# Patient Record
Sex: Female | Born: 1945 | Race: White | Hispanic: No | Smoking: Former smoker
Health system: Southern US, Community
[De-identification: ages and names within clinical notes are randomized; demographics above are authoritative.]

## PROBLEM LIST (undated history)

## (undated) DIAGNOSIS — I10 Essential (primary) hypertension: Secondary | ICD-10-CM

## (undated) DIAGNOSIS — F439 Reaction to severe stress, unspecified: Secondary | ICD-10-CM

## (undated) DIAGNOSIS — F329 Major depressive disorder, single episode, unspecified: Secondary | ICD-10-CM

## (undated) DIAGNOSIS — D649 Anemia, unspecified: Secondary | ICD-10-CM

## (undated) DIAGNOSIS — C241 Malignant neoplasm of ampulla of Vater: Secondary | ICD-10-CM

## (undated) DIAGNOSIS — M545 Low back pain, unspecified: Secondary | ICD-10-CM

## (undated) DIAGNOSIS — F32A Depression, unspecified: Secondary | ICD-10-CM

## (undated) DIAGNOSIS — E785 Hyperlipidemia, unspecified: Secondary | ICD-10-CM

## (undated) DIAGNOSIS — F419 Anxiety disorder, unspecified: Secondary | ICD-10-CM

## (undated) DIAGNOSIS — G44229 Chronic tension-type headache, not intractable: Secondary | ICD-10-CM

## (undated) DIAGNOSIS — N200 Calculus of kidney: Secondary | ICD-10-CM

## (undated) DIAGNOSIS — K589 Irritable bowel syndrome without diarrhea: Secondary | ICD-10-CM

## (undated) DIAGNOSIS — Z86711 Personal history of pulmonary embolism: Secondary | ICD-10-CM

## (undated) DIAGNOSIS — K219 Gastro-esophageal reflux disease without esophagitis: Secondary | ICD-10-CM

## (undated) HISTORY — DX: Anemia, unspecified: D64.9

## (undated) HISTORY — PX: WHIPPLE PROCEDURE: SHX2667

## (undated) HISTORY — DX: Hyperlipidemia, unspecified: E78.5

## (undated) HISTORY — PX: AUGMENTATION MAMMAPLASTY: SUR837

## (undated) HISTORY — PX: HIP SURGERY: SHX245

## (undated) HISTORY — PX: VENTRAL HERNIA REPAIR: SHX424

## (undated) HISTORY — PX: BLADDER SURGERY: SHX569

## (undated) HISTORY — DX: Calculus of kidney: N20.0

## (undated) HISTORY — DX: Low back pain: M54.5

## (undated) HISTORY — PX: CHOLECYSTECTOMY: SHX55

## (undated) HISTORY — PX: COLONOSCOPY: SHX174

## (undated) HISTORY — DX: Personal history of pulmonary embolism: Z86.711

## (undated) HISTORY — DX: Essential (primary) hypertension: I10

## (undated) HISTORY — DX: Reaction to severe stress, unspecified: F43.9

## (undated) HISTORY — DX: Chronic tension-type headache, not intractable: G44.229

## (undated) HISTORY — PX: TUBAL LIGATION: SHX77

## (undated) HISTORY — PX: PLACEMENT OF BREAST IMPLANTS: SHX6334

## (undated) HISTORY — DX: Irritable bowel syndrome, unspecified: K58.9

## (undated) HISTORY — DX: Malignant neoplasm of ampulla of Vater: C24.1

## (undated) HISTORY — PX: PANCREATICODUODENECTOMY: SUR1000

## (undated) HISTORY — PX: LITHOTRIPSY: SUR834

## (undated) HISTORY — PX: APPENDECTOMY: SHX54

## (undated) HISTORY — DX: Anxiety disorder, unspecified: F41.9

## (undated) HISTORY — DX: Gastro-esophageal reflux disease without esophagitis: K21.9

## (undated) HISTORY — DX: Low back pain, unspecified: M54.50

---

## 2004-04-07 ENCOUNTER — Ambulatory Visit: Payer: Self-pay | Admitting: Unknown Physician Specialty

## 2004-07-25 ENCOUNTER — Emergency Department: Payer: Self-pay | Admitting: Emergency Medicine

## 2004-11-21 ENCOUNTER — Ambulatory Visit: Payer: Self-pay | Admitting: Unknown Physician Specialty

## 2005-01-22 ENCOUNTER — Ambulatory Visit: Payer: Self-pay | Admitting: Unknown Physician Specialty

## 2005-11-23 ENCOUNTER — Ambulatory Visit: Payer: Self-pay | Admitting: Unknown Physician Specialty

## 2006-12-17 ENCOUNTER — Ambulatory Visit: Payer: Self-pay | Admitting: Unknown Physician Specialty

## 2007-06-13 ENCOUNTER — Ambulatory Visit: Payer: Self-pay | Admitting: Unknown Physician Specialty

## 2008-01-09 ENCOUNTER — Ambulatory Visit: Payer: Self-pay | Admitting: Unknown Physician Specialty

## 2008-10-11 ENCOUNTER — Ambulatory Visit: Payer: Self-pay | Admitting: Unknown Physician Specialty

## 2008-11-23 ENCOUNTER — Ambulatory Visit: Payer: Self-pay | Admitting: Unknown Physician Specialty

## 2008-12-21 ENCOUNTER — Ambulatory Visit: Payer: Self-pay | Admitting: Gastroenterology

## 2009-01-11 ENCOUNTER — Ambulatory Visit: Payer: Self-pay | Admitting: Unknown Physician Specialty

## 2009-02-27 DIAGNOSIS — I2699 Other pulmonary embolism without acute cor pulmonale: Secondary | ICD-10-CM | POA: Insufficient documentation

## 2009-02-27 DIAGNOSIS — Z86711 Personal history of pulmonary embolism: Secondary | ICD-10-CM | POA: Insufficient documentation

## 2009-02-28 ENCOUNTER — Ambulatory Visit: Payer: Self-pay | Admitting: Unknown Physician Specialty

## 2009-05-20 ENCOUNTER — Ambulatory Visit: Payer: Self-pay | Admitting: Unknown Physician Specialty

## 2009-12-26 ENCOUNTER — Ambulatory Visit: Payer: Self-pay | Admitting: Specialist

## 2010-01-27 ENCOUNTER — Ambulatory Visit: Payer: Self-pay | Admitting: Specialist

## 2010-02-19 ENCOUNTER — Ambulatory Visit: Payer: Self-pay | Admitting: Specialist

## 2010-03-06 ENCOUNTER — Ambulatory Visit: Payer: Self-pay | Admitting: Unknown Physician Specialty

## 2011-04-06 ENCOUNTER — Ambulatory Visit: Payer: Self-pay | Admitting: Unknown Physician Specialty

## 2012-05-03 ENCOUNTER — Ambulatory Visit: Payer: Self-pay | Admitting: Physician Assistant

## 2012-07-22 ENCOUNTER — Emergency Department: Payer: Self-pay | Admitting: Emergency Medicine

## 2012-07-22 LAB — COMPREHENSIVE METABOLIC PANEL
Albumin: 3.2 g/dL — ABNORMAL LOW (ref 3.4–5.0)
Alkaline Phosphatase: 105 U/L (ref 50–136)
Anion Gap: 9 (ref 7–16)
BUN: 20 mg/dL — ABNORMAL HIGH (ref 7–18)
Bilirubin,Total: 0.5 mg/dL (ref 0.2–1.0)
Calcium, Total: 8.4 mg/dL — ABNORMAL LOW (ref 8.5–10.1)
Chloride: 105 mmol/L (ref 98–107)
Co2: 24 mmol/L (ref 21–32)
Creatinine: 0.92 mg/dL (ref 0.60–1.30)
EGFR (African American): >60
EGFR (Non-African Amer.): >60
Glucose: 166 mg/dL — ABNORMAL HIGH (ref 65–99)
Osmolality: 282 (ref 275–301)
Potassium: 3.6 mmol/L (ref 3.5–5.1)
SGOT(AST): 26 U/L (ref 15–37)
SGPT (ALT): 35 U/L (ref 12–78)
Sodium: 138 mmol/L (ref 136–145)
Total Protein: 6.7 g/dL (ref 6.4–8.2)

## 2012-07-22 LAB — CBC
HCT: 39.5 % (ref 35.0–47.0)
HGB: 13.5 g/dL (ref 12.0–16.0)
MCH: 31.3 pg (ref 26.0–34.0)
MCHC: 34.3 g/dL (ref 32.0–36.0)
MCV: 91 fL (ref 80–100)
Platelet: 185 10*3/uL (ref 150–440)
RBC: 4.32 10*6/uL (ref 3.80–5.20)
RDW: 12.1 % (ref 11.5–14.5)
WBC: 9 10*3/uL (ref 3.6–11.0)

## 2012-07-22 LAB — TROPONIN I: Troponin-I: <0.02 ng/mL

## 2013-03-30 ENCOUNTER — Ambulatory Visit: Payer: Self-pay | Admitting: Physician Assistant

## 2013-04-05 ENCOUNTER — Ambulatory Visit: Payer: Self-pay | Admitting: Urology

## 2013-04-13 ENCOUNTER — Ambulatory Visit: Payer: Self-pay | Admitting: Urology

## 2013-05-02 ENCOUNTER — Ambulatory Visit: Payer: Self-pay | Admitting: Urology

## 2013-07-12 ENCOUNTER — Ambulatory Visit: Payer: Self-pay | Admitting: Urology

## 2014-01-16 ENCOUNTER — Inpatient Hospital Stay: Payer: Self-pay | Admitting: Internal Medicine

## 2014-01-16 LAB — BASIC METABOLIC PANEL
Anion Gap: 6 — ABNORMAL LOW (ref 7–16)
BUN: 21 mg/dL — ABNORMAL HIGH (ref 7–18)
Calcium, Total: 8.2 mg/dL — ABNORMAL LOW (ref 8.5–10.1)
Chloride: 106 mmol/L (ref 98–107)
Co2: 28 mmol/L (ref 21–32)
Creatinine: 0.85 mg/dL (ref 0.60–1.30)
EGFR (African American): >60
EGFR (Non-African Amer.): >60
Glucose: 112 mg/dL — ABNORMAL HIGH (ref 65–99)
Osmolality: 283 (ref 275–301)
Potassium: 3.9 mmol/L (ref 3.5–5.1)
Sodium: 140 mmol/L (ref 136–145)

## 2014-01-16 LAB — URINALYSIS, COMPLETE
Bacteria: NONE SEEN
Bilirubin,UR: NEGATIVE
Blood: NEGATIVE
Glucose,UR: NEGATIVE mg/dL (ref 0–75)
Ketone: NEGATIVE
Leukocyte Esterase: NEGATIVE
Nitrite: NEGATIVE
Ph: 7 (ref 4.5–8.0)
Protein: NEGATIVE
RBC,UR: 41 /HPF (ref 0–5)
Specific Gravity: 1.013 (ref 1.003–1.030)
Squamous Epithelial: 1
WBC UR: 7 /HPF (ref 0–5)

## 2014-01-16 LAB — CBC
HCT: 41.4 % (ref 35.0–47.0)
HGB: 14 g/dL (ref 12.0–16.0)
MCH: 31.4 pg (ref 26.0–34.0)
MCHC: 33.9 g/dL (ref 32.0–36.0)
MCV: 93 fL (ref 80–100)
Platelet: 213 10*3/uL (ref 150–440)
RBC: 4.47 10*6/uL (ref 3.80–5.20)
RDW: 12.6 % (ref 11.5–14.5)
WBC: 8.7 10*3/uL (ref 3.6–11.0)

## 2014-01-18 LAB — BASIC METABOLIC PANEL
Anion Gap: 5 — ABNORMAL LOW (ref 7–16)
BUN: 14 mg/dL (ref 7–18)
Calcium, Total: 7.6 mg/dL — ABNORMAL LOW (ref 8.5–10.1)
Chloride: 103 mmol/L (ref 98–107)
Co2: 29 mmol/L (ref 21–32)
Creatinine: 0.89 mg/dL (ref 0.60–1.30)
EGFR (African American): >60
EGFR (Non-African Amer.): >60
Glucose: 128 mg/dL — ABNORMAL HIGH (ref 65–99)
Osmolality: 276 (ref 275–301)
Potassium: 3.8 mmol/L (ref 3.5–5.1)
Sodium: 137 mmol/L (ref 136–145)

## 2014-01-18 LAB — PLATELET COUNT: Platelet: 202 10*3/uL (ref 150–440)

## 2014-01-18 LAB — HEMOGLOBIN: HGB: 12.2 g/dL (ref 12.0–16.0)

## 2014-01-19 LAB — BASIC METABOLIC PANEL
Anion Gap: 3 — ABNORMAL LOW (ref 7–16)
BUN: 14 mg/dL (ref 7–18)
Calcium, Total: 7.8 mg/dL — ABNORMAL LOW (ref 8.5–10.1)
Chloride: 108 mmol/L — ABNORMAL HIGH (ref 98–107)
Co2: 29 mmol/L (ref 21–32)
Creatinine: 0.82 mg/dL (ref 0.60–1.30)
EGFR (African American): >60
EGFR (Non-African Amer.): >60
Glucose: 113 mg/dL — ABNORMAL HIGH (ref 65–99)
Osmolality: 281 (ref 275–301)
Potassium: 3.7 mmol/L (ref 3.5–5.1)
Sodium: 140 mmol/L (ref 136–145)

## 2014-01-19 LAB — PLATELET COUNT: Platelet: 215 10*3/uL (ref 150–440)

## 2014-01-19 LAB — HEMOGLOBIN: HGB: 11.8 g/dL — ABNORMAL LOW (ref 12.0–16.0)

## 2014-01-22 LAB — PATHOLOGY REPORT

## 2014-02-21 DIAGNOSIS — F430 Acute stress reaction: Secondary | ICD-10-CM | POA: Insufficient documentation

## 2014-03-06 DIAGNOSIS — Z9660 Presence of unspecified orthopedic joint implant: Secondary | ICD-10-CM | POA: Insufficient documentation

## 2014-03-14 ENCOUNTER — Ambulatory Visit: Payer: Self-pay

## 2014-05-22 ENCOUNTER — Ambulatory Visit: Payer: Self-pay | Admitting: Physician Assistant

## 2014-08-17 NOTE — Op Note (Signed)
PATIENT NAME:  Kimberly Meyer, Kimberly Meyer MR#:  [id number] DATE OF BIRTH:  06/19/1945  DATE OF PROCEDURE:  04/05/2013  PREOPERATIVE DIAGNOSIS: Right mid ureteral calculus.   POSTOPERATIVE DIAGNOSIS: Right mid ureteral calculus.  PROCEDURE: Cystoscopy, right retrograde pyelogram and stent placement.  DESCRIPTION OF PROCEDURE: The patient was sterilely prepped and draped in supine lithotomy position for ease of approach to the external genitalia.  I began the procedure. I looked in the bladder, there were no tumors, masses or growths. Right ureter was easily seen in horseshoe orifice in proper position. So, I do a retrograde utilizing an open-ended catheter. The retrograde reveals an calculus at the superior portion of L4 in the mid ureter. The contrast goes around the calculus into the kidney. I was able to an 0.038 wire past the calculus utilizing the open-ended catheter close to the stone. However, I am unable to get a stent up initially, so I have to dilate with the 5-French eight open-ended ureteral catheter over the 0.038 Glidewire. Once there is little dilatation around the stone, I am able to slide a 6-French 26 cm stent easily into place, curled in the renal pelvis and the bladder. I checked its position in the bladder and it is in proper position and it is coiled in the renal pelvis. The patient tolerated procedure well and was sent to recovery in satisfactory condition with an empty bladder. Marcaine 30 mL 0.5% in the bladder and B and O suppository. Of note, the patient has a rectocele with some constipated stool within the rectocele. Otherwise, no tumors, masses or growths in the rectum.    ____________________________ Janice Coffin. Elnoria Howard, DO rdh:aw D: 04/05/2013 14:22:17 ET T: 04/05/2013 14:27:26 ET JOB#: [id number]  cc: Janice Coffin. Elnoria Howard, DO, <Dictator> Sonoma Firkus D Righteous Claiborne DO ELECTRONICALLY SIGNED 04/14/2013 15:39

## 2014-08-18 NOTE — H&P (Signed)
PATIENT NAME:  Kimberly Meyer, Kimberly Meyer MR#:  [id number] DATE OF BIRTH:  01-Nov-1945  DATE OF ADMISSION:  01/16/2014  PRIMARY CARE PHYSICIAN:  Dr. Kem Kays.    CHIEF COMPLAINT: Right hip pain, fall today.   HISTORY OF PRESENT ILLNESS:  Kimberly Meyer is a 69 year old Caucasian female with past medical history of hypertension, comes to the Emergency Room after she had a fall today and started hurting in her right hip. Workup in the Emergency Room revealed a right femoral neck fracture. The patient is being admitted for further evaluation and management.   The patient reports falling in the parking lot about a week ago and hurting on her right knee since then.  She has been complaining on and off of pain in her right knee and right hip. She had abrasion on her right knee. She was supposed to get MRI of the hip September 29. However she had a fall today and now is being admitted for right femur neck fracture.    PAST MEDICAL HISTORY:  1. Hypertension.  2. Borderline diabetes.  3. Depression.  4. Hypercholesterolemia.   PAST SURGICAL HISTORY: Cholecystectomy and Whipple procedure.    MEDICATIONS:    1.  Zantac 1 capsule 3 times a day.  2.  Nexium 40 mg p.o. daily.  3.  Lisinopril 5 mg daily.  4.  Bupropion 150 mg 1 tablet b.i.d.  5.  Allegra 180 mg p.o. daily.   ALLERGIES: SULFA DRUGS.   FAMILY HISTORY: Positive for father with dementia. Mother with stroke.   SOCIAL HISTORY:  History of smoking in the remote past and quit about 30 years ago. Denies any alcohol use. 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   REVIEW OF SYSTEMS: CONSTITUTIONAL: No fever, fatigue, weakness.  EYES: No blurred or double vision.  ENT: No tinnitus, ear pain, hearing loss.  RESPIRATORY: No cough, wheeze, hemoptysis, or dyspnea.  CARDIOVASCULAR: No chest pain, orthopnea, edema, or arrhythmia.  GASTROINTESTINAL: No nausea, vomiting, diarrhea, abdominal pain.  No GERD.  GENITOURINARY:  No dysuria, hematuria, or  frequency.  ENDOCRINE: No polyuria, nocturia, or thyroid problems.  HEMATOLOGY: No anemia, easy bruising or bleeding.  SKIN: No acne, rash or lesion.  MUSCULOSKELETAL: Positive for right hip pain.  NEUROLOGIC: No CVA, TIA, ataxia, or dementia.   PSYCHIATRIC:  No anxiety or depression.   All other systems reviewed and negative.   PHYSICAL EXAMINATION:  GENERAL: The patient is awake, alert, oriented x 3, not in acute distress.  VITAL SIGNS: Afebrile, pulse is 81, blood pressure is 166/95, saturations are 96% on room air.  HEENT: Atraumatic, normocephalic. Pupils PERRLA.  EOM intact. Oral mucosa is moist.  NECK: Supple. No JVD, no carotid bruits.  RESPIRATORY:  Clear to auscultation.   HEART: Both the heart sounds are normal. Rate and rhythm are regular. PMI not lateralized. Chest is nontender.  VASCULAR: Good pedal pulses, good femoral pulses. No lower extremity edema.  ABDOMEN: Soft, benign, nontender. No organomegaly. Positive bowel sounds.  NEUROLOGIC: Grossly intact cranial nerves II-XII. No motor or sensory deficit.  PSYCHIATRIC:  The patient is awake, alert, oriented x 3.  EXTREMITIES:  The patient does have right hip pain.   RADIOLOGY:  Right hip x-ray showed laterally displaced right femoral neck fracture.  Chest x-ray, no active disease.   LABORATORY DATA:  CBC within normal limits. Basic metabolic panel within normal limits. EKG showed normal sinus rhythm.   ASSESSMENT: A 69 year old, Kimberly Meyer, with history of hypertension, comes in with:  1.  Right femoral neck fracture status post fall. The patient had a mechanical fall. She has no history of coronary artery disease, is very active and functional, no chest pain or shortness of breath. She is at low to intermediate risk for surgery. Risks and benefits of surgery discussed with the patient. We will do orthopedic consultation with Dr. Marry Guan. He is aware of the patient being admitted.  2.  Depression. Continue Wellbutrin  SR b.i.d.  3.  Hypertension. Continue Zestril.  4.  Deep vein thrombosis prophylaxis. Per Dr. Marry Guan.   5.  Gastroesophageal reflux disease.  Continue PPI.   Hospital admission plan was discussed with the patient, no family members were present.   TIME SPENT: 50 minutes.    ____________________________ Hart Rochester Posey Pronto, MD sap:bu D: 01/16/2014 20:05:00 ET T: 01/16/2014 20:28:21 ET JOB#: [id number]  cc: Rustyn Conery A. Posey Pronto, MD, <Dictator> Lorenza Evangelist, MD Ilda Basset MD ELECTRONICALLY SIGNED 01/18/2014 15:24

## 2014-08-18 NOTE — Consult Note (Signed)
Brief Consult Note: Diagnosis: Displaced right femoral neck fracture.   Comments: Recommend right hip hemiarthroplasty The risks and benefits of surgical intervention were discussed in detail with the patient. The patient expressed understanding of the risks and benefits and agreed with plans for surgery.  Surgical site signed as per "right site surgery" protocol.  Electronic Signatures: Dereck Leep (MD)  (Signed 22-Sep-15 21:02)  Authored: Brief Consult Note   Last Updated: 22-Sep-15 21:02 by Dereck Leep (MD)

## 2014-08-18 NOTE — Op Note (Signed)
PATIENT NAME:  Kimberly Meyer, TRIVETT MR#:  [id number] DATE OF BIRTH:  14-Dec-1945  DATE OF PROCEDURE:  01/17/2014  PREOPERATIVE DIAGNOSIS:  Displaced right femoral neck fracture.   POSTOPERATIVE DIAGNOSIS:  Displaced right femoral neck fracture.   PROCEDURE PERFORMED:  Right hip hemiarthroplasty.   SURGEON:  Laurice Record. Hooten, MD  ANESTHESIA:  Spinal.   ESTIMATED BLOOD LOSS:  300 mL.   FLUIDS REPLACED:  1600 mL of crystalloid.   DRAINS:  Two medium drains to Hemovac reservoir.   IMPLANTS UTILIZED:  DePuy size 2 Summit femoral stem (cemented), 43 mm outer diameter Cathcart hip ball, a +0 tapered spacer, an 11 mm Cementralizer, and a size 3 cement restrictor.   INDICATIONS FOR SURGERY:  The patient is a 69 year old female who fell on the day prior to surgery, and studies demonstrated a displaced right femoral neck fracture. After discussion of the risks and benefits of surgical intervention, the patient expressed understanding of the risks and benefits and agreed with plans for surgical intervention.   PROCEDURE IN DETAIL:  The patient was brought into the operating room, and after adequate spinal anesthesia was achieved, the patient was placed in a left lateral decubitus position. Axillary roll was placed, and all bony prominences were well padded. The patient's right hip and leg were cleaned and prepped with alcohol and DuraPrep draped in the usual sterile fashion. A "timeout" was performed as per usual protocol. A lateral curvilinear incision was made, gently curving towards the posterior superior iliac spine. IT band was incised in line with the skin incision. Fibers of the gluteus maximus were split in line. Piriformis tendon was visualized, skeletonized, incised at its insertion of the proximal femur, and reflected posteriorly. In a similar fashion, short external rotators were incised and reflected posteriorly. A T-type posterior capsulotomy was performed. A large hemarthrosis was evacuated. A  corkscrew device was used to remove the femoral head. The femoral head was measured using calipers and ring gauges, and it was felt that a 43 mm outer diameter hip ball was appropriate. The acetabulum was inspected for any residual fracture debris. The articular surface was in excellent condition. A femoral neck cut was then performed using an oscillating saw. A pilot hole for reaming in preparation of the canal was then performed. Conical reamer was inserted and serial broaches were inserted up to a size 2 broach. Trial reduction was performed with a 43 mm hip ball with a +0 neck length. This allowed for excellent restoration of limb length and hip offset. Excellent stability was noted both anteriorly and posteriorly. Trial components were removed. The femoral canal was sized, and it was felt that a size 3 cement restrictor was appropriate. A size 3 cement restrictor was inserted to the appropriate depth. Proximal femoral canal was irrigated with copious amounts of normal saline with antibiotic solution using pulsatile lavage and suctioned dry. The canal was then packed with vaginal packing soaked in dilute Neo-Synephrine. Polymethylmethacrylate cement was prepared in the usual fashion using a vacuum mixer. Cement was applied. It was introduced into the femoral canal in retrograde fashion and then pressurized. A size 2 Summit femoral component with an 11 mm Cementralizer was positioned and impacted into place. Excess cement was removed using freer elevators. After adequate curing of cement, Morse taper was cleaned and dried. A 43 mm outer diameter Cathcart hip ball with a +0 mm tapered spacer was placed on the trunnion and impacted into place. The acetabulum was again inspected, and the femoral head was  reduced. Excellent stability was noted both anteriorly and posteriorly. Good hip offset was appreciated, and good equalization of limb lengths was noted. The posterior capsulotomy was repaired using #5 Ethibond. The  piriformis tendon was reapproximated to the undersurface of the gluteus medius tendon using #5 Ethibond. The wound was irrigated with copious amounts of normal saline with antibiotic solution using pulsatile lavage and then suctioned dry. Good hemostasis was noted. Two medium Hemovac drains were placed in the wound bed and brought out through a separate stab incision. IT band was repaired using interrupted sutures of #1 Vicryl. The subcutaneous tissue was reapproximated in layers using first #0 Vicryl followed by #2-0 Vicryl. The skin was closed with skin staples. A sterile dressing was applied.   The patient tolerated the procedure well. She was transported to the recovery room in stable condition.    ____________________________ Laurice Record. Holley Bouche., MD jph:nb D: 01/18/2014 00:46:56 ET T: 01/18/2014 02:37:55 ET JOB#: [id number]  cc: Laurice Record. Holley Bouche., MD, <Dictator> JAMES P Holley Bouche MD ELECTRONICALLY SIGNED 01/22/2014 7:03

## 2014-08-18 NOTE — Consult Note (Signed)
PATIENT NAME:  Kimberly Meyer, Kimberly Meyer MR#:  [id number] DATE OF BIRTH:  07-28-1945  DATE OF CONSULTATION:  01/16/2014  CONSULTING PHYSICIAN:  Jeneen Rinks P. Holley Bouche., MD  REQUESTING PHYSICIAN: Dr. Fritzi Mandes.  CHIEF COMPLAINT: Right hip pain.   HISTORY OF PRESENT ILLNESS: The patient is a 69 year old female who sustained a fall on September 14. She had the onset of some right groin pain and presented to [facility] walk-in clinic, at which time x-rays were inconclusive. She had tentatively been scheduled for MRI of the right hip due to the persistent pain. She was using a cane for ambulation after the initial fall. Earlier today she fell and had increased right hip and groin pain. She was unable to stand or bear weight due to the pain. X-rays obtained at [facility] Emergency Room demonstrate a displaced right femoral neck fracture.   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   PAST SURGICAL HISTORY: Bladder tack, cholecystectomy, tubal ligation, bilateral breast implants with subsequent revision to saline implants, Whipple procedure (2010), and ventral hernia repair.   MEDICATIONS AT THE TIME OF ADMISSION: Zantac t.i.d., Nexium 40 mg daily, lisinopril 5 mg daily, bupropion 150 mg b.i.d., Allegra 180 mg daily, Metamucil at bedtime.   ALLERGIES: SULFA DRUGS.   FAMILY HISTORY: Positive for dementia in the father and CVA with the mother.   SOCIAL HISTORY: The patient is married. She has remote history of cigarette smoking. She denies any current alcohol use.   PERTINENT REVIEW OF SYSTEMS:  MUSCULOSKELETAL AND NEUROLOGIC: Unremarkable with the exception of the right hip pain.   PHYSICAL EXAMINATION:  GENERAL: Well-developed, well-nourished female seen in no acute distress, lying in the bed. Buck's traction is intact to the right lower  extremity.  HEENT: Atraumatic, normocephalic. Sclerae are clear. Extraocular motions are intact.  OROPHARYNX: Clear with moist mucosa.  NECK: Supple, nontender, and with good range of motion.  LUNGS: Clear to auscultation bilaterally.  CARDIAC: Regular rate and rhythm with normal S1, S2. No appreciable murmurs, gallops, or rubs. Pedal pulses are palpable bilaterally.  ABDOMEN: Soft, nontender. Bowel sounds are present.  MUSCULOSKELETAL: Examination of the upper extremities is unremarkable. Examination of the lower extremities revealed the findings of the right hip. The right lower extremity is shortened and rotated. Pain is elicited with any attempt at range of motion of the hip. The right knee is nontender to palpation without any apparent effusion. No tenderness to palpation about the pretibial area.  NEUROLOGIC: Awake, alert, oriented. Sensory function is intact. Motor strength is grossly 5/5 with the exception of the right lower extremity which was not assessed due to the injury. No clonus or tremor.   X-RAYS: Radiographs of the pelvis and right hip demonstrate a displaced right femoral neck fracture. There appears to be reasonably good preservation of the cartilage space. Diffuse osteopenia is appreciated.   IMPRESSION: Displaced right femoral neck fracture.   PLAN: Findings were discussed in detail with the patient. Recommendation was made for right hip hemiarthroplasty. The usual perioperative course was discussed with the patient. She expressed her understanding of the risk and benefits and agreed with plans for right hip hemiarthroplasty.    ____________________________ Laurice Record. Holley Bouche., MD jph:lt D: 01/16/2014 22:00:32 ET T: 01/16/2014 22:11:07 ET JOB#: [id number]  cc: Jeneen Rinks P. Holley Bouche., MD, <Dictator> JAMES P Holley Bouche MD ELECTRONICALLY SIGNED 01/22/2014 7:03

## 2014-08-18 NOTE — Discharge Summary (Signed)
PATIENT NAME:  Kimberly Meyer, Kimberly Meyer MR#:  [id number] DATE OF BIRTH:  [birth year]  DATE OF ADMISSION:  01/16/2014 DATE OF DISCHARGE:  01/19/2014  DISCHARGE DIAGNOSES:    1.  Right femoral neck fracture.   2.  Gastroesophageal reflux disease.  3.  Hypertension.   DISCHARGE MEDICATIONS:  1.  Ditropan 150 mg 3 times daily.  2.  Lisinopril 5 mg daily.  4.  Nexium 1 tablet daily.  5.  Zenpep 1 tablet p.o. t.i.d.  6.  Acetaminophen, that is Tylenol, 500 mg every 4 hours as needed. 7.  Oxycodone 5 mg 1-2 tablets every 4 hours as needed for severe pain.  8.  Tramadol 50 mg 1-2 tablets every 4 hours as needed for pain.  9.  Magnesium oxide as needed for constipation.  10.  Lovenox 30 mg every 12 hours.  for 14 days 11.  Pantoprazole 40 mg p.o. b.i.d.  12.  The patient was advised to take Senna and Bisacodyl as needed for constipation.  13.  The patient is given Lovenox until 02/03/2014, for 2 weeks.  14.  Oxycodone and Tylenol prescriptions were given by orthopedics.    CONSULTATIONS: Orthopedic consultation with Laurice Record. Holley Bouche., MD.  DISPOSITION: Discharged home with physical therapy.   HOSPITAL COURSE: The patient is a 69 year old female patient who came in because of right hip pain and fall. The patient was unable to walk. She was scheduled to see Dr. Marry Guan as an outpatient, but because of the fall on the day of the admission, she came to the hospital. The patient's vitals were stable on admission. The x-ray of the right hip showed a right femoral neck fracture. Admitted to the medical service  for hip  fracture. Seen by orthopedic doctor, Dr. Marry Guan. Continued on pain medications. The patient had a right hip hemiarthroplasty on 01/17/2014 and the patient tolerated the procedure well. Postoperatively, the patient's vitals were stable. She did not have fever. She did not have any trouble breathing and the patient did not require any blood transfusion.   She was discharged to home with home  physical therapy, as she walked well with physical therapy. The orthopedic  physician gave her a prescription for oxycodone and also Lovenox, and the patient's renal function and white count were stable.   The patient's discharge vitals were temperature 97.3, heart rate 85, blood pressure 110/68, saturation 96% on room air.   Time spent on discharge of patient more than 30 minutes.   ____________________________ Epifanio Lesches, MD sk:MT D: 01/20/2014 08:49:14 ET T: 01/20/2014 16:02:28 ET JOB#: [id number]  cc: Epifanio Lesches, MD, <Dictator> Epifanio Lesches MD ELECTRONICALLY SIGNED 01/22/2014 15:56

## 2014-11-07 ENCOUNTER — Encounter: Payer: Self-pay | Admitting: Urology

## 2014-11-07 ENCOUNTER — Ambulatory Visit (INDEPENDENT_AMBULATORY_CARE_PROVIDER_SITE_OTHER): Payer: Medicare Other | Admitting: Urology

## 2014-11-07 VITALS — BP 123/74 | HR 80 | Resp 18 | Ht 61.0 in | Wt 116.3 lb

## 2014-11-07 DIAGNOSIS — I10 Essential (primary) hypertension: Secondary | ICD-10-CM | POA: Insufficient documentation

## 2014-11-07 DIAGNOSIS — C241 Malignant neoplasm of ampulla of Vater: Secondary | ICD-10-CM | POA: Insufficient documentation

## 2014-11-07 DIAGNOSIS — I2699 Other pulmonary embolism without acute cor pulmonale: Secondary | ICD-10-CM | POA: Insufficient documentation

## 2014-11-07 DIAGNOSIS — K599 Functional intestinal disorder, unspecified: Secondary | ICD-10-CM | POA: Insufficient documentation

## 2014-11-07 DIAGNOSIS — G44221 Chronic tension-type headache, intractable: Secondary | ICD-10-CM | POA: Insufficient documentation

## 2014-11-07 DIAGNOSIS — E785 Hyperlipidemia, unspecified: Secondary | ICD-10-CM | POA: Insufficient documentation

## 2014-11-07 DIAGNOSIS — N1330 Unspecified hydronephrosis: Secondary | ICD-10-CM | POA: Diagnosis not present

## 2014-11-07 DIAGNOSIS — K219 Gastro-esophageal reflux disease without esophagitis: Secondary | ICD-10-CM | POA: Insufficient documentation

## 2014-11-07 DIAGNOSIS — N3000 Acute cystitis without hematuria: Secondary | ICD-10-CM | POA: Diagnosis not present

## 2014-11-07 DIAGNOSIS — K589 Irritable bowel syndrome without diarrhea: Secondary | ICD-10-CM | POA: Insufficient documentation

## 2014-11-07 DIAGNOSIS — N200 Calculus of kidney: Secondary | ICD-10-CM | POA: Diagnosis not present

## 2014-11-07 LAB — URINALYSIS, COMPLETE
Bilirubin, UA: NEGATIVE
Glucose, UA: NEGATIVE
Ketones, UA: NEGATIVE
Nitrite, UA: NEGATIVE
Protein, UA: NEGATIVE
Specific Gravity, UA: 1.02 (ref 1.005–1.030)
Urobilinogen, Ur: 0.2 mg/dL (ref 0.2–1.0)
pH, UA: 7.5 (ref 5.0–7.5)

## 2014-11-07 LAB — MICROSCOPIC EXAMINATION

## 2014-11-07 MED ORDER — CIPROFLOXACIN HCL 500 MG PO TABS: 500.0000 mg | ORAL_TABLET | Freq: Two times a day (BID) | ORAL | Status: DC

## 2014-11-07 NOTE — Progress Notes (Signed)
11/07/2014 5:02 PM   Kimberly Meyer 06-16-1945 [id number]  Referring provider: No referring provider defined for this encounter.  Chief Complaint  Patient presents with  . Nephrolithiasis    Left side  . Establish Care    HPI: 69 yo M with history of nephrolithiasis who underwent CT with contrast at [facility] on 11/01/14 for work up of epigastric pain over the past few months.  She underwent Wipple 01/2009 and contacted her surgeon at [facility] for further evaluation who ordered the scan as part of his work up.  On CT scan, she was noted to have an incidental 1.3 cm left distal ureteral stone with moderate to severe hydronephrosis.     She denies any flank pain, dysuria, gross hematuria, fevers or chills.    She does complain of urination which stops and starts and difficultly evacuation bladder.    She does have history of stones s/p ESWL fall 2015.    No recent labs in the past month.     PMH: Past Medical History  Diagnosis Date  . Kidney stone   . Hyperlipemia   . GERD (gastroesophageal reflux disease)   . Chronic neck pain   . IBS (irritable bowel syndrome)   . Ampullary carcinoma   . Low back pain   . History of pulmonary embolus (PE)   . Hypertension   . Atrial fibrillation   . Situational stress   . Chronic tension headache   . Anxiety   . Anemia     Surgical History: Past Surgical History  Procedure Laterality Date  . Lithotripsy    . Bladder surgery      bladder tack  . Hip surgery    . Appendectomy    . Colonoscopy    . Tubal ligation    . Placement of breast implants    . Cholecystectomy    . 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  Always use your most recent med list.               buPROPion 150 MG 12 hr tablet  Commonly known as:  WELLBUTRIN SR  Take by mouth.     ciprofloxacin 500 MG tablet  Commonly known as:  CIPRO    Take 1 tablet (500 mg total) by mouth 2 (two) times daily.     fexofenadine 180 MG tablet  Commonly known as:  ALLEGRA  Take by mouth.     fluticasone 50 MCG/ACT nasal spray  Commonly known as:  FLONASE  Place into the nose.     lisinopril 10 MG tablet  Commonly known as:  PRINIVIL,ZESTRIL  Take by mouth.     MULTI-VITAMINS Tabs  Take by mouth.     Omega 3 1000 MG Caps  Take by mouth.     Potassium Citrate 15 MEQ (1620 MG) Tbcr  Take by mouth.     predniSONE 5 MG tablet  Commonly known as:  DELTASONE  6 po day one decrease by 30m qd til gone.     ZENPEP 20000 UNITS Cpep  Generic drug:  Pancrelipase (Lip-Prot-Amyl)  TAKE AS DIRECTED BEFORE MEALS AND SNACKS        Allergies:  Allergies  Allergen Reactions  . Sulfa Antibiotics Other (See Comments)    Does not remember reaction-from childhood    Family  History: Family History  Problem Relation Age of Onset  . Alzheimer's disease Father   . Kidney Stones Father   . Stroke Mother     Social History:  reports that she has quit smoking. She does not have any smokeless tobacco history on file. She reports that she does not drink alcohol. Her drug history is not on file.   Review of Systems UROLOGY Frequent Urination?: No Hard to postpone urination?: No Burning/pain with urination?: No Get up at night to urinate?: Yes Leakage of urine?: No Urine stream starts and stops?: No Trouble starting stream?: No Do you have to strain to urinate?: Yes Blood in urine?: No Urinary tract infection?: No Sexually transmitted disease?: No Injury to kidneys or bladder?: No Painful intercourse?: No Weak stream?: No Currently pregnant?: No Vaginal bleeding?: No Last menstrual period?: --- Gastrointestinal Nausea?: No Vomiting?: No Indigestion/heartburn?: No Diarrhea?: No Constipation?: No Constitutional Fever: No Night sweats?: No Weight loss?: No Fatigue?: No Skin Skin rash/lesions?: No Itching?:  No Eyes Blurred vision?: No Double vision?: No Ears/Nose/Throat Sore throat?: No Sinus problems?: No Hematologic/Lymphatic Swollen glands?: No Easy bruising?: No Cardiovascular Leg swelling?: No Chest pain?: No Respiratory Cough?: No Shortness of breath?: No Endocrine Excessive thirst?: No Musculoskeletal Back pain?: No Joint pain?: No Neurological Headaches?: No Dizziness?: No Psychologic Depression?: No Anxiety?: No   Physical Exam: BP 123/74 mmHg  Pulse 80  Resp 18  Ht 5' 1" (1.549 m)  Wt 116 lb 4.8 oz (52.753 kg)  BMI 21.99 kg/m2  Constitutional:  Alert and oriented, No acute distress. HEENT: [place] AT, moist mucus membranes.  Trachea midline, no masses. Cardiovascular: No clubbing, cyanosis, or edema. RRR.   Respiratory: Normal respiratory effort, no increased work of breathing. CTAB.   GI: Abdomen is soft, nontender, nondistended, no abdominal masses GU: No CVA tenderness.  Skin: No rashes, bruises or suspicious lesions. Lymph: No cervical or inguinal adenopathy. Neurologic: Grossly intact, no focal deficits, moving all 4 extremities. Psychiatric: Normal mood and affect.  Laboratory Data: Comprehensive Metabolic Panel (CMP) - Final result (10/02/2014 8:33 AM) Comprehensive Metabolic Panel (CMP) - Final result (10/02/2014 8:33 AM)  Component Value Range  Glucose 104 70-110 mg/dL  Sodium 140 136-145 mmol/L  Potassium 4.3 3.6-5.1 mmol/L  Chloride 104 97-109 mmol/L  Carbon Dioxide (CO2) 27.3 22.0-32.0 mmol/L  Urea Nitrogen (BUN) 25 7-25 mg/dL  Creatinine 1.0 0.6-1.1 mg/dL  Glomerular Filtration Rate (eGFR), MDRD Estimate 55 (L) >60 mL/min/1.73sq m  Calcium 9.7 8.7-10.3 mg/dL  AST  36 8-39 U/L  ALT  44 (H) 5-38 U/L  Alk Phos (alkaline Phosphatase) 85 34-104 U/L  Albumin 4.0 3.5-4.8 g/dL  Bilirubin, Total 0.4 0.3-1.2 mg/dL  Protein, Total 6.4 6.1-7.9 g/dL  A/G Ratio 1.7      Urinalysis Urine dipstick shows positive for RBC's and positive for  leukocytes.  Micro exam: 11-30 WBC's per HPF, 3-10 RBC's per HPF, few bacteria and amorphous crystals seen.  Results for orders placed or performed in visit on 11/07/14  Microscopic Examination  Result Value Ref Range   WBC, UA 11-30 (A) 0 -  5 /hpf   RBC, UA 3-10 (A) 0 -  2 /hpf   Epithelial Cells (non renal) 0-10 0 - 10 /hpf   Casts Present (A) None seen /lpf   Cast Type Amorphous Sediment N/A   Bacteria, UA Few None seen/Few  Urinalysis, Complete  Result Value Ref Range   Specific Gravity, UA 1.020 1.005 - 1.030   pH, UA 7.5 5.0 -  7.5   Color, UA Yellow Yellow   Appearance Ur Cloudy (A) Clear   Leukocytes, UA 3+ (A) Negative   Protein, UA Negative Negative/Trace   Glucose, UA Negative Negative   Ketones, UA Negative Negative   RBC, UA Trace (A) Negative   Bilirubin, UA Negative Negative   Urobilinogen, Ur 0.2 0.2 - 1.0 mg/dL   Nitrite, UA Negative Negative   Microscopic Examination See below:      Pertinent Imaging: CT abdomen and pelvis with IV contrast, 11/01/2014 ([facility])   Comparison:Abdominal CT 02/19/2010 (outside study).    Indication:R10.9 Unspecified abdominal pain, K43.9 Ventral hernia without  obstruction or gangrene, Evaluate for ventral hernia..    Technique:CT imaging was performed of the abdomen and pelvis following  the uncomplicated administration of intravenous contrast (Isovue-300, 150  mL at 3 mL/sec).Iodinated contrast was used due to the indications for  the examination, to improve disease detection and further define anatomy.  The most recent serum creatinine is 1.0 mg/dL. Coronal reformatted images  were generated and reviewed.    Findings:     Limited views of the lung bases are unremarkable. Postsurgical changes  status post bilateral breast augmentation noted.    The liver is normal in size, contour, and attenuation. Patent portal veins.  The hepatic veins are not well visualized due to phase of contrast. Post   surgical changes status post Whipple are redemonstrated. The anastomoses  appear patent. The remaining pancreas is normal in appearance, without  peripancreatic fluid or edema. There is no pancreatic ductal dilatation.  Mildly enlarged nodes are again seen in the porta hepatis. A reference  lymph node measures 1.1 cm in short axis, previously 1.1 cm (series 2,  image 37). The gallbladder is surgically absent. The spleen is normal.     The right adrenal gland is normal. There is a 1.2 cm hypoattenuating lesion  on the left adrenal gland (series 2, image 33), grossly stable compared to  remote prior. A subcentimeter hypodense lesion in the superior pole of the  right kidney is too small for CT characterization (series 2, image 41). No  right hydronephrosis or ureterectasis. There is a 1.3 x 0.5 cm obstructing  calculus in the left distal ureter (series 2, image 121 ) with associated  upstream ureterectasis and moderate/severe hydronephrosis. Slightly delayed  nephrogram on the left. Small cystocele.     The small and large bowel are normal in caliber throughout. There is no  focal or diffuse bowel wall thickening. The appendix is not definitively  seen, but there are no secondary signs of appendicitis. Hernia tacks along  the anterior abdominal wall are present, in keeping with prior hernia  repair. There is diastases of the rectus muscles, with no evidence of  recurrent hernia.    The abdominal aorta is normal in caliber. Atherosclerotic ossifications are  seen in the aorta and its branch vessels.    No aggressive appearing osseous lesions. Multilevel degenerative changes of  the thoracolumbar spine. The patient is status post right total hip  arthroplasty.    Impression:    1. 1.3 x 0.5 cm obstructing calculus in the left distal ureter, with  associated stranding ureterectasis and moderate/severe left hydronephrosis.    2. Diastases of the  rectus muscle status post ventral hernia repair, with  no evidence of recurrent hernia.  3. No evidence of recurrent or metastatic disease in the abdomen or pelvis.    Dr. Hessie Dibble discussed these findings with Dr. Dossie Der at [phone] on  11/01/2014.      The preliminary report (critical or emergent communication) was reviewed  prior to this dictation and there are no substantial differences between  the preliminary results and the impressions in this final report.      Electronically Reviewed ZW:CHEN Shropshire, MD  Electronically Reviewed on:11/02/2014 9:40 AM   Assessment & Plan:  65 y o F with large 1.3 cm obstructing left distal ureteral stone with moderate/ severe hydronephrosis.  I suspect based on the scan and lack of symptoms, that the stone has been present for quite some time.  STAT labs ordered today, patient advised to be NPO at [facility] in case urgency intervention needed tomorrow.  If labs stable, will plan for treatment of stone next week.  In the interim, she was advised to present to ED emergently if becomes symptomatic or develops fevers or chills.    Risks and benefits of ureteroscopy were reviewed including but not limited to infection, bleeding, pain, ureteral injury which could require open surgery versus prolonged indwelling if ureteralperforation occurs, persistent stone disease, requirement for staged procedure, possible stent, and global anesthesia risks. Patient expressed understanding and desires to proceed with ureteroscopy.  We also discussed ESWL but given the size and location of the stone, feel that URS is the best approach.   1. Left nephrolithiasis - Urinalysis, Complete - CULTURE, URINE COMPREHENSIVE - CBC with Differential/Platelet - Basic metabolic panel  2. Hydronephrosis, left   3. Acute cystitis without hematuria cipro bid x 7 days, f/u urine culture and adjust as needed  I spent 30 min with this patient of which greater than 50% was  spent in counseling and coordination of care with the patient.   Return for surgery to be scheduled.  Hollice Espy, MD  [facility] [address] [place], [place] [address] [phone]

## 2014-11-07 NOTE — Patient Instructions (Signed)
° °                                                      Ureteroscopy ° °A Ureteroscopy is an examination of the upper urinary tract, performed with a ureteroscope that is passed through the urethra, the bladder, and then directly into the ureter. It is performed to find the cause of urine blockage in a ureter, perform a biopsy or identify and evaluate other abnormalities inside the ureters or kidneys. It is useful in the diagnosis and treatment of disorders such as kidney stones, ureteral strictures or other abnormalities of the ureter. Smaller stones in the bladder or lower ureter can be removed in one piece, while bigger ones are usually broken before removal during ureteroscopy. ° °*After your ureteroscopy, your doctor may need to place a stent in a ureter to drain urine from the kidney to the bladder while swelling in the ureter goes away. The stent may cause some discomfort to your kidney and ureter. The discomfort is generally mild. The stent may be left in for a week or more. You will be instructed to either remove the stent yourself at home by pulling a string protruding from your urethra or to return to our office for removal by your doctor. ° °After a ureteroscopy you may  °have a mild burning feeling when urinating °see small amounts of blood in the urine °have mild discomfort in the bladder area or kidney area when urinating °need to urinate more frequently or urgently °*These problems should not last more than 24 hours unless a ureteral stent was placed. ° °If a stent was placed symptoms symptoms may continue until it is removed. You should notify your doctor right away if bleeding or pain is severe. ° °To prevent or relieve discomfort it can be helpful to °drink 16 ounces of water each hour for 2 hours after the procedure °take a warm bath to relieve the burning feeling °hold a warm, damp washcloth over the urethral opening to relieve discomfort °take an over-the-counter pain reliever ° °Please notify  our office if you experience any of the following symptoms °burning with urination lasting more than 24hrs °Fever greater than 100F or present directly to emergency department °abdominal or flank pain °very bloody, cloudy or foul smelling urine ° °*If you have any additional questions or concerns please call our office at [phone] ° °[facility] °[address], [address] °[place], [place] [address] °[phone] ° ° ° ° ° °

## 2014-11-08 ENCOUNTER — Encounter
Admission: RE | Admit: 2014-11-08 | Discharge: 2014-11-08 | Disposition: A | Discharge status: Home / Self Care | Payer: Medicare Other | Source: Ambulatory Visit | Attending: Urology | Admitting: Urology

## 2014-11-08 ENCOUNTER — Telehealth: Payer: Self-pay | Admitting: Urology

## 2014-11-08 DIAGNOSIS — Z029 Encounter for administrative examinations, unspecified: Secondary | ICD-10-CM | POA: Diagnosis not present

## 2014-11-08 LAB — CBC WITH DIFFERENTIAL/PLATELET
Basophils Absolute: 0.1 10*3/uL (ref 0.0–0.2)
Basos: 1 %
EOS (ABSOLUTE): 0.3 10*3/uL (ref 0.0–0.4)
Eos: 4 %
Hematocrit: 41.1 % (ref 34.0–46.6)
Hemoglobin: 14.3 g/dL (ref 11.1–15.9)
Immature Grans (Abs): 0 10*3/uL (ref 0.0–0.1)
Immature Granulocytes: 0 %
Lymphocytes Absolute: 2.5 10*3/uL (ref 0.7–3.1)
Lymphs: 40 %
MCH: 30.9 pg (ref 26.6–33.0)
MCHC: 34.8 g/dL (ref 31.5–35.7)
MCV: 89 fL (ref 79–97)
Monocytes Absolute: 0.6 10*3/uL (ref 0.1–0.9)
Monocytes: 9 %
Neutrophils Absolute: 2.8 10*3/uL (ref 1.4–7.0)
Neutrophils: 46 %
Platelets: 254 10*3/uL (ref 150–379)
RBC: 4.63 x10E6/uL (ref 3.77–5.28)
RDW: 13.7 % (ref 12.3–15.4)
WBC: 6.1 10*3/uL (ref 3.4–10.8)

## 2014-11-08 LAB — BASIC METABOLIC PANEL
BUN/Creatinine Ratio: 20 (ref 11–26)
BUN: 19 mg/dL (ref 8–27)
CO2: 27 mmol/L (ref 18–29)
Calcium: 10.1 mg/dL (ref 8.7–10.3)
Chloride: 100 mmol/L (ref 97–108)
Creatinine, Ser: 0.94 mg/dL (ref 0.57–1.00)
GFR calc Af Amer: 71 mL/min/{1.73_m2} (ref >59)
GFR calc non Af Amer: 62 mL/min/{1.73_m2} (ref >59)
Glucose: 78 mg/dL (ref 65–99)
Potassium: 4.4 mmol/L (ref 3.5–5.2)
Sodium: 143 mmol/L (ref 134–144)

## 2014-11-08 NOTE — Telephone Encounter (Signed)
I received a phone call from the pt with concerns about her stat lab results. I informed her that you have not reviewed her labs, but just looking over them I don't really see anything concerning.Per your office note If labs are stable we will plan for treatment of stone next week. I also told the pt that you will review the labs and if anything changes we will give her a call back. The pt was notified of her Pre-admit appt schedule for today @ 2:45pm.  [phone].

## 2014-11-08 NOTE — Patient Instructions (Signed)
  Your procedure is scheduled on: Tuesday 11/13/2014  Report to Day Surgery. 2ND FLOOR MEDICAL MALL ENTRANCE To find out your arrival time please call [phone] between 1PM - 3PM on Monday 11/12/2014.  Remember: Instructions that are not followed completely may result in serious medical risk, up to and including death, or upon the discretion of your surgeon and anesthesiologist your surgery may need to be rescheduled.    __X__ 1. Do not eat food or drink liquids after midnight. No gum chewing or hard candies.     __X__ 2. No Alcohol for 24 hours before or after surgery.   ____ 3. Bring all medications with you on the day of surgery if instructed.    __X__ 4. Notify your doctor if there is any change in your medical condition     (cold, fever, infections).     Do not wear jewelry, make-up, hairpins, clips or nail polish.  Do not wear lotions, powders, or perfumes. You may wear deodorant.  Do not shave 48 hours prior to surgery. Men may shave face and neck.  Do not bring valuables to the hospital.    [facility] is not responsible for any belongings or valuables.               Contacts, dentures or bridgework may not be worn into surgery.  Leave your suitcase in the car. After surgery it may be brought to your room.  For patients admitted to the hospital, discharge time is determined by your                treatment team.   Patients discharged the day of surgery will not be allowed to drive home.   Please read over the following fact sheets that you were given:   Surgical Site Infection Prevention   __X__ Take these medicines the morning of surgery with A SIP OF WATER:    1. LISINOPRIL  2. Hedrick  3.   4.  5.  6.  ____ Fleet Enema (as directed)   __X__ Use CHG Soap as directed  SHOWER WITH AN ANTIBACTERIAL SOAP LIKE DIAL  ____ Use inhalers on the day of surgery  ____ Stop metformin 2 days prior to surgery    ____ Take 1/2 of usual insulin dose the night before  surgery and none on the morning of surgery.   __X__ Stop Coumadin/Plavix/aspirin on TODAY  ____ Stop Anti-inflammatories on    __X__ Stop supplements until after surgery.  OMEGA  ____ Bring C-Pap to the hospital.

## 2014-11-08 NOTE — Telephone Encounter (Signed)
Called patient and reviewed lab results which are wnl.  Plan to precede with URS, LL, stent on Tuesday.  Warning signs of need for emergent intervention reviewed with patient.  She is taking abx and we will adjust as needed.    Hollice Espy, MD

## 2014-11-09 LAB — CULTURE, URINE COMPREHENSIVE

## 2014-11-12 MED ORDER — AMPICILLIN SODIUM 1 G IJ SOLR
1.0000 g | Freq: Once | INTRAMUSCULAR | Status: AC
  Administered 2014-11-13: 1 g via INTRAVENOUS
  Filled 2014-11-12: qty 1000

## 2014-11-12 MED ORDER — GENTAMICIN SULFATE 40 MG/ML IJ SOLN
80.0000 mg | Freq: Once | INTRAVENOUS | Status: AC
  Administered 2014-11-13: 80 mg via INTRAVENOUS
  Filled 2014-11-12: qty 2

## 2014-11-13 ENCOUNTER — Encounter
Admission: RE | Disposition: A | Discharge status: Home / Self Care | Payer: Self-pay | Source: Ambulatory Visit | Attending: Urology

## 2014-11-13 ENCOUNTER — Ambulatory Visit
Admission: RE | Admit: 2014-11-13 | Discharge: 2014-11-13 | Disposition: A | Discharge status: Home / Self Care | Payer: Medicare Other | Source: Ambulatory Visit | Attending: Urology | Admitting: Urology

## 2014-11-13 ENCOUNTER — Ambulatory Visit: Payer: Medicare Other | Admitting: Anesthesiology

## 2014-11-13 ENCOUNTER — Encounter: Payer: Self-pay | Admitting: *Deleted

## 2014-11-13 DIAGNOSIS — E785 Hyperlipidemia, unspecified: Secondary | ICD-10-CM | POA: Insufficient documentation

## 2014-11-13 DIAGNOSIS — K219 Gastro-esophageal reflux disease without esophagitis: Secondary | ICD-10-CM | POA: Insufficient documentation

## 2014-11-13 DIAGNOSIS — F419 Anxiety disorder, unspecified: Secondary | ICD-10-CM | POA: Diagnosis not present

## 2014-11-13 DIAGNOSIS — Z9049 Acquired absence of other specified parts of digestive tract: Secondary | ICD-10-CM | POA: Insufficient documentation

## 2014-11-13 DIAGNOSIS — Z8507 Personal history of malignant neoplasm of pancreas: Secondary | ICD-10-CM | POA: Diagnosis not present

## 2014-11-13 DIAGNOSIS — Z79899 Other long term (current) drug therapy: Secondary | ICD-10-CM | POA: Insufficient documentation

## 2014-11-13 DIAGNOSIS — K589 Irritable bowel syndrome without diarrhea: Secondary | ICD-10-CM | POA: Insufficient documentation

## 2014-11-13 DIAGNOSIS — Z882 Allergy status to sulfonamides status: Secondary | ICD-10-CM | POA: Diagnosis not present

## 2014-11-13 DIAGNOSIS — N201 Calculus of ureter: Secondary | ICD-10-CM

## 2014-11-13 DIAGNOSIS — Z87442 Personal history of urinary calculi: Secondary | ICD-10-CM | POA: Diagnosis not present

## 2014-11-13 DIAGNOSIS — Z8489 Family history of other specified conditions: Secondary | ICD-10-CM | POA: Insufficient documentation

## 2014-11-13 DIAGNOSIS — R1013 Epigastric pain: Secondary | ICD-10-CM | POA: Insufficient documentation

## 2014-11-13 DIAGNOSIS — N200 Calculus of kidney: Secondary | ICD-10-CM

## 2014-11-13 DIAGNOSIS — I4891 Unspecified atrial fibrillation: Secondary | ICD-10-CM | POA: Diagnosis not present

## 2014-11-13 DIAGNOSIS — Z86711 Personal history of pulmonary embolism: Secondary | ICD-10-CM | POA: Diagnosis not present

## 2014-11-13 DIAGNOSIS — M542 Cervicalgia: Secondary | ICD-10-CM | POA: Insufficient documentation

## 2014-11-13 DIAGNOSIS — N132 Hydronephrosis with renal and ureteral calculous obstruction: Secondary | ICD-10-CM | POA: Diagnosis not present

## 2014-11-13 DIAGNOSIS — M545 Low back pain: Secondary | ICD-10-CM | POA: Insufficient documentation

## 2014-11-13 DIAGNOSIS — Z87891 Personal history of nicotine dependence: Secondary | ICD-10-CM | POA: Insufficient documentation

## 2014-11-13 DIAGNOSIS — Z978 Presence of other specified devices: Secondary | ICD-10-CM | POA: Insufficient documentation

## 2014-11-13 DIAGNOSIS — I10 Essential (primary) hypertension: Secondary | ICD-10-CM | POA: Insufficient documentation

## 2014-11-13 DIAGNOSIS — G44229 Chronic tension-type headache, not intractable: Secondary | ICD-10-CM | POA: Insufficient documentation

## 2014-11-13 DIAGNOSIS — G8929 Other chronic pain: Secondary | ICD-10-CM | POA: Insufficient documentation

## 2014-11-13 DIAGNOSIS — Z823 Family history of stroke: Secondary | ICD-10-CM | POA: Insufficient documentation

## 2014-11-13 HISTORY — PX: CYSTOSCOPY/URETEROSCOPY/HOLMIUM LASER/STENT PLACEMENT: SHX6546

## 2014-11-13 SURGERY — CYSTOSCOPY/URETEROSCOPY/HOLMIUM LASER/STENT PLACEMENT
Anesthesia: General | Laterality: Left | Wound class: Clean Contaminated

## 2014-11-13 MED ORDER — HYDROCODONE-ACETAMINOPHEN 5-325 MG PO TABS: 1.0000 | ORAL_TABLET | Freq: Four times a day (QID) | ORAL | Status: DC | PRN

## 2014-11-13 MED ORDER — EPHEDRINE SULFATE 50 MG/ML IJ SOLN
INTRAMUSCULAR | Status: DC | PRN
  Administered 2014-11-13 (×2): 10 mg via INTRAVENOUS

## 2014-11-13 MED ORDER — OXYCODONE HCL 5 MG/5ML PO SOLN: 5.0000 mg | Freq: Once | ORAL | Status: DC | PRN

## 2014-11-13 MED ORDER — ONDANSETRON HCL 4 MG/2ML IJ SOLN
INTRAMUSCULAR | Status: DC | PRN
  Administered 2014-11-13: 4 mg via INTRAVENOUS

## 2014-11-13 MED ORDER — TAMSULOSIN HCL 0.4 MG PO CAPS: 0.4000 mg | ORAL_CAPSULE | Freq: Every day | ORAL | Status: DC

## 2014-11-13 MED ORDER — FENTANYL CITRATE (PF) 100 MCG/2ML IJ SOLN: 25.0000 ug | INTRAMUSCULAR | Status: DC | PRN

## 2014-11-13 MED ORDER — LIDOCAINE HCL (CARDIAC) 20 MG/ML IV SOLN
INTRAVENOUS | Status: DC | PRN
  Administered 2014-11-13: 60 mg via INTRAVENOUS

## 2014-11-13 MED ORDER — FAMOTIDINE 20 MG PO TABS
ORAL_TABLET | ORAL | Status: AC
  Administered 2014-11-13: 20 mg via ORAL
  Filled 2014-11-13: qty 1

## 2014-11-13 MED ORDER — GLYCOPYRROLATE 0.2 MG/ML IJ SOLN
INTRAMUSCULAR | Status: DC | PRN
  Administered 2014-11-13: 0.2 mg via INTRAVENOUS

## 2014-11-13 MED ORDER — OXYCODONE HCL 5 MG PO TABS: 5.0000 mg | ORAL_TABLET | Freq: Once | ORAL | Status: DC | PRN

## 2014-11-13 MED ORDER — PHENYLEPHRINE HCL 10 MG/ML IJ SOLN
INTRAMUSCULAR | Status: DC | PRN
  Administered 2014-11-13: 100 ug via INTRAVENOUS
  Administered 2014-11-13: 200 ug via INTRAVENOUS
  Administered 2014-11-13 (×2): 100 ug via INTRAVENOUS

## 2014-11-13 MED ORDER — DEXAMETHASONE SODIUM PHOSPHATE 4 MG/ML IJ SOLN
INTRAMUSCULAR | Status: DC | PRN
  Administered 2014-11-13: 5 mg via INTRAVENOUS

## 2014-11-13 MED ORDER — MIDAZOLAM HCL 2 MG/2ML IJ SOLN
INTRAMUSCULAR | Status: DC | PRN
  Administered 2014-11-13: 2 mg via INTRAVENOUS

## 2014-11-13 MED ORDER — FAMOTIDINE 20 MG PO TABS
20.0000 mg | ORAL_TABLET | Freq: Once | ORAL | Status: AC
  Administered 2014-11-13: 20 mg via ORAL

## 2014-11-13 MED ORDER — LACTATED RINGERS IV SOLN
INTRAVENOUS | Status: DC
  Administered 2014-11-13: 07:00:00 via INTRAVENOUS

## 2014-11-13 MED ORDER — FENTANYL CITRATE (PF) 100 MCG/2ML IJ SOLN
INTRAMUSCULAR | Status: DC | PRN
  Administered 2014-11-13 (×3): 25 ug via INTRAVENOUS

## 2014-11-13 MED ORDER — PROPOFOL 10 MG/ML IV BOLUS
INTRAVENOUS | Status: DC | PRN
  Administered 2014-11-13: 150 mg via INTRAVENOUS

## 2014-11-13 MED ORDER — OXYBUTYNIN CHLORIDE 5 MG PO TABS: 5.0000 mg | ORAL_TABLET | Freq: Three times a day (TID) | ORAL | Status: DC | PRN

## 2014-11-13 SURGICAL SUPPLY — 42 items
ADAPTER SCOPE UROLOK II (MISCELLANEOUS) ×2 IMPLANT
ADH LQ OCL WTPRF AMP STRL LF (MISCELLANEOUS) ×1
ADHESIVE MASTISOL STRL (MISCELLANEOUS) ×1 IMPLANT
ADPR INSRT BALL FIT URLK2 (MISCELLANEOUS) ×1
BAG DRAIN CYSTO-URO LG1000N (MISCELLANEOUS) ×2 IMPLANT
BASKET ZERO TIP 1.9FR (BASKET) ×2 IMPLANT
BSKT STON RTRVL ZERO TP 1.9FR (BASKET) ×1
BULB IRRIG PATHFIND (MISCELLANEOUS) ×1 IMPLANT
CATH URETL 5X70 OPEN END (CATHETERS) ×2 IMPLANT
CNTNR SPEC 2.5X3XGRAD LEK (MISCELLANEOUS) ×1
CONRAY 43 FOR UROLOGY 50M (MISCELLANEOUS) ×1 IMPLANT
CONT SPEC 4OZ STER OR WHT (MISCELLANEOUS) ×1
CONT SPEC 4OZ STRL OR WHT (MISCELLANEOUS) ×1
CONTAINER SPEC 2.5X3XGRAD LEK (MISCELLANEOUS) ×1 IMPLANT
DRSG TEGADERM 2-3/8X2-3/4 SM (GAUZE/BANDAGES/DRESSINGS) ×1 IMPLANT
GLOVE BIO SURGEON STRL SZ 6.5 (GLOVE) ×3 IMPLANT
GLOVE BIO SURGEON STRL SZ7 (GLOVE) ×4 IMPLANT
GOWN STRL REUS W/ TWL LRG LVL3 (GOWN DISPOSABLE) ×2 IMPLANT
GOWN STRL REUS W/TWL LRG LVL3 (GOWN DISPOSABLE) ×4
INTRODUCER DILATOR DOUBLE (INTRODUCER) ×1 IMPLANT
JELLY LUB 2OZ STRL (MISCELLANEOUS) ×1
JELLY LUBE 2OZ STRL (MISCELLANEOUS) ×1 IMPLANT
KIT RM TURNOVER CYSTO AR (KITS) ×2 IMPLANT
LASER HOLMIUM FIBER SU 550UM (MISCELLANEOUS) ×1 IMPLANT
LASER HOLMIUM SU 200UM (MISCELLANEOUS) ×1 IMPLANT
PACK CYSTO AR (MISCELLANEOUS) ×2 IMPLANT
PREP PVP WINGED SPONGE (MISCELLANEOUS) ×2 IMPLANT
PUMP SINGLE ACTION SAP (PUMP) ×1 IMPLANT
SENSORWIRE 0.038 NOT ANGLED (WIRE) ×2
SET CYSTO W/LG BORE CLAMP LF (SET/KITS/TRAYS/PACK) ×1 IMPLANT
SHEATH URETERAL 12FRX35CM (MISCELLANEOUS) ×1 IMPLANT
SOL .9 NS 3000ML IRR  AL (IV SOLUTION) ×1
SOL .9 NS 3000ML IRR AL (IV SOLUTION) ×1
SOL .9 NS 3000ML IRR UROMATIC (IV SOLUTION) ×1 IMPLANT
STENT CONTOUR 6FRX22CM ×1 IMPLANT
STENT URET 6FRX22 CONTOUR (STENTS) ×1 IMPLANT
STENT URET 6FRX24 CONTOUR (STENTS) ×1 IMPLANT
STENT URET 6FRX26 CONTOUR (STENTS) ×1 IMPLANT
TUBING PRES M/F 48IN 4237111 (TUBING) ×1 IMPLANT
WATER STERILE IRR 1000ML POUR (IV SOLUTION) ×2 IMPLANT
WIRE SENSOR 0.038 NOT ANGLED (WIRE) ×2 IMPLANT
stent contour 6frx22cm (Stent) ×1 IMPLANT

## 2014-11-13 NOTE — Interval H&P Note (Signed)
History and Physical Interval Note:  11/13/2014 7:21 AM  Kimberly Meyer  has presented today for surgery, with the diagnosis of distal ureteral stone  The various methods of treatment have been discussed with the patient and family. After consideration of risks, benefits and other options for treatment, the patient has consented to  Procedure(s): CYSTOSCOPY/URETEROSCOPY/HOLMIUM LASER/STENT PLACEMENT (Left) as a surgical intervention .  The patient's history has been reviewed, patient examined, no change in status, stable for surgery.  I have reviewed the patient's chart and labs.  Questions were answered to the patient's satisfaction.     Hollice Espy

## 2014-11-13 NOTE — Op Note (Signed)
Date of procedure: 11/13/2014  Preoperative diagnosis:  1. Left distal ureteral stone   Postoperative diagnosis:  1. Same as above   Procedure: 1. Left ureteroscopy, laser lithotripsy 2. Left ureteral stent placement  Surgeon: Hollice Espy, MD  Anesthesia: General  Complications: None  Intraoperative findings: Large 1.3 cm left distal ureteral stone identified, obliterated. Procedure uncomplicated. Stent on a string place at the end of the procedure.  EBL: Minimal  Specimens: None  Drains: 6 x 22 French double-J ureteral stent on left (string left in place)  Indication: Kimberly Meyer is a 69 y.o. patient with 1.3 cm left distal ureteral stone.  After reviewing the management options for treatment, he elected to proceed with the above surgical procedure(s). We have discussed the potential benefits and risks of the procedure, side effects of the proposed treatment, the likelihood of the patient achieving the goals of the procedure, and any potential problems that might occur during the procedure or recuperation. Informed consent has been obtained.  Description of procedure:  The patient was taken to the operating room and general anesthesia was induced.  The patient was placed in the dorsal lithotomy position, prepped and draped in the usual sterile fashion, and preoperative antibiotics were administered. A preoperative time-out was performed.   At this point in time a rigid 41 French cystoscope was advanced per urethra into the bladder. Attention was turned to the left ureteral orifice which was cannulated using a 5 [place] open-ended ureteral catheter and a sensor wire. Under fluoroscopy, the stone could be seen within the distal ureter just distal to the iliac vessels. The sensor wire was able to be placed up to level of the kidney without difficulty. The wire was then snapped in place. A short semirigid ureteroscope was then advanced to the distal ureter without difficulty up to  the level of the stone. Using a 365  laser fiber, the stone was obliterated using settings of 0.8 J and 10 Hz. Each and every fragment was removed from the distal ureter using a 1.9 [place] to plus nitinol basket. Once the ureter was visually clear, the scope was advanced all the way up to level of the proximal ureter and no residual stone fragments identified. A 6 x 22 French double-J ureteral stent was then placed over the wire up to level of the kidney. The wire was partially drawn and a coil within the renal pelvis. The wire was then fully withdrawn and a coil was noted within the bladder. The access sheath of the scope was used to drain the bladder. The string was left in place. The string was secured to the patient's left inner thigh using Mastisol and Tegaderm. She was then repositioned in the supine position, reversed from anesthesia, and taken to the PACU in stable condition. There were no complications in this case.  Plan: Patient will remove own stent on Friday.  She will return to the office in 4 weeks with RUS prior.    Hollice Espy, M.D.

## 2014-11-13 NOTE — Anesthesia Postprocedure Evaluation (Signed)
  Anesthesia Post-op Note  Patient: Kimberly Meyer  Procedure(s) Performed: Procedure(s): CYSTOSCOPY/URETEROSCOPY/HOLMIUM LASER/STENT PLACEMENT (Left)  Anesthesia type:General  Patient location: PACU  Post pain: Pain level controlled  Post assessment: Post-op Vital signs reviewed, Patient's Cardiovascular Status Stable, Respiratory Function Stable, Patent Airway and No signs of Nausea or vomiting  Post vital signs: Reviewed and stable  Last Vitals:  Filed Vitals:   11/13/14 0911  BP: 106/69  Pulse: 77  Temp: 36.1 C  Resp: 20    Level of consciousness: awake, alert  and patient cooperative  Complications: No apparent anesthesia complications

## 2014-11-13 NOTE — Discharge Instructions (Signed)
You have a ureteral stent in place.  This is a tube that extends from your kidney to your bladder.  This may cause urinary bleeding, burning with urination, and urinary frequency.  Please call our office or present to the ED if you develop fevers >101 or pain which is not able to be controlled with oral pain medications.  You may be given either Flomax and/ or ditropan to help with bladder spasms and stent pain in addition to pain medications.    Your stent is on a string taped to your left inner tight.  Avoid pulling this until Friday if possible.  On Friday, untape stent and remove in entirety.    [place] [address], [place], [place] [address] [phone] AMBULATORY SURGERY  DISCHARGE INSTRUCTIONS   1) The drugs that you were given will stay in your system until tomorrow so for the next 24 hours you should not:  A) Drive an automobile B) Make any legal decisions C) Drink any alcoholic beverage   2) You may resume regular meals tomorrow.  Today it is better to start with liquids and gradually work up to solid foods.  You may eat anything you prefer, but it is better to start with liquids, then soup and crackers, and gradually work up to solid foods.   3) Please notify your doctor immediately if you have any unusual bleeding, trouble breathing, redness and pain at the surgery site, drainage, fever, or pain not relieved by medication.    4) Additional Instructions:        Please contact your physician with any problems or Same Day Surgery at [phone], Monday through Friday 6 am to 4 pm, or [place] [facility] number at [phone].

## 2014-11-13 NOTE — Brief Op Note (Signed)
11/13/2014  8:23 AM  PATIENT:  Kimberly Meyer  69 y.o. female  PRE-OPERATIVE DIAGNOSIS:  distal ureteral stone  POST-OPERATIVE DIAGNOSIS:  left distal ureteral stone  PROCEDURE:  Procedure(s): CYSTOSCOPY/URETEROSCOPY/HOLMIUM LASER/STENT PLACEMENT (Left)  SURGEON:  Surgeon(s) and Role:    * Hollice Espy, MD - Primary  ASSISTANTS: none   ANESTHESIA:   general  EBL:  Total I/O In: 700 [I.V.:700] Out: -   Drains: 6 x 22 Fr JJ ureteral stent on left (string in place)  Specimen: none  COUNTS CORRECT: YES  PLAN OF CARE: Discharge to home after PACU  PATIENT DISPOSITION:  PACU - hemodynamically stable.

## 2014-11-13 NOTE — H&P (View-Only) (Signed)
11/07/2014 5:02 PM   Kimberly Meyer 06-16-1945 [id number]  Referring provider: No referring provider defined for this encounter.  Chief Complaint  Patient presents with  . Nephrolithiasis    Left side  . Establish Care    HPI: 69 yo M with history of nephrolithiasis who underwent CT with contrast at [facility] on 11/01/14 for work up of epigastric pain over the past few months.  She underwent Wipple 01/2009 and contacted her surgeon at [facility] for further evaluation who ordered the scan as part of his work up.  On CT scan, she was noted to have an incidental 1.3 cm left distal ureteral stone with moderate to severe hydronephrosis.     She denies any flank pain, dysuria, gross hematuria, fevers or chills.    She does complain of urination which stops and starts and difficultly evacuation bladder.    She does have history of stones s/p ESWL fall 2015.    No recent labs in the past month.     PMH: Past Medical History  Diagnosis Date  . Kidney stone   . Hyperlipemia   . GERD (gastroesophageal reflux disease)   . Chronic neck pain   . IBS (irritable bowel syndrome)   . Ampullary carcinoma   . Low back pain   . History of pulmonary embolus (PE)   . Hypertension   . Atrial fibrillation   . Situational stress   . Chronic tension headache   . Anxiety   . Anemia     Surgical History: Past Surgical History  Procedure Laterality Date  . Lithotripsy    . Bladder surgery      bladder tack  . Hip surgery    . Appendectomy    . Colonoscopy    . Tubal ligation    . Placement of breast implants    . Cholecystectomy    . 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  Always use your most recent med list.               buPROPion 150 MG 12 hr tablet  Commonly known as:  WELLBUTRIN SR  Take by mouth.     ciprofloxacin 500 MG tablet  Commonly known as:  CIPRO    Take 1 tablet (500 mg total) by mouth 2 (two) times daily.     fexofenadine 180 MG tablet  Commonly known as:  ALLEGRA  Take by mouth.     fluticasone 50 MCG/ACT nasal spray  Commonly known as:  FLONASE  Place into the nose.     lisinopril 10 MG tablet  Commonly known as:  PRINIVIL,ZESTRIL  Take by mouth.     MULTI-VITAMINS Tabs  Take by mouth.     Omega 3 1000 MG Caps  Take by mouth.     Potassium Citrate 15 MEQ (1620 MG) Tbcr  Take by mouth.     predniSONE 5 MG tablet  Commonly known as:  DELTASONE  6 po day one decrease by 30m qd til gone.     ZENPEP 20000 UNITS Cpep  Generic drug:  Pancrelipase (Lip-Prot-Amyl)  TAKE AS DIRECTED BEFORE MEALS AND SNACKS        Allergies:  Allergies  Allergen Reactions  . Sulfa Antibiotics Other (See Comments)    Does not remember reaction-from childhood    Family  History: Family History  Problem Relation Age of Onset  . Alzheimer's disease Father   . Kidney Stones Father   . Stroke Mother     Social History:  reports that she has quit smoking. She does not have any smokeless tobacco history on file. She reports that she does not drink alcohol. Her drug history is not on file.   Review of Systems UROLOGY Frequent Urination?: No Hard to postpone urination?: No Burning/pain with urination?: No Get up at night to urinate?: Yes Leakage of urine?: No Urine stream starts and stops?: No Trouble starting stream?: No Do you have to strain to urinate?: Yes Blood in urine?: No Urinary tract infection?: No Sexually transmitted disease?: No Injury to kidneys or bladder?: No Painful intercourse?: No Weak stream?: No Currently pregnant?: No Vaginal bleeding?: No Last menstrual period?: --- Gastrointestinal Nausea?: No Vomiting?: No Indigestion/heartburn?: No Diarrhea?: No Constipation?: No Constitutional Fever: No Night sweats?: No Weight loss?: No Fatigue?: No Skin Skin rash/lesions?: No Itching?:  No Eyes Blurred vision?: No Double vision?: No Ears/Nose/Throat Sore throat?: No Sinus problems?: No Hematologic/Lymphatic Swollen glands?: No Easy bruising?: No Cardiovascular Leg swelling?: No Chest pain?: No Respiratory Cough?: No Shortness of breath?: No Endocrine Excessive thirst?: No Musculoskeletal Back pain?: No Joint pain?: No Neurological Headaches?: No Dizziness?: No Psychologic Depression?: No Anxiety?: No   Physical Exam: BP 123/74 mmHg  Pulse 80  Resp 18  Ht 5' 1" (1.549 m)  Wt 116 lb 4.8 oz (52.753 kg)  BMI 21.99 kg/m2  Constitutional:  Alert and oriented, No acute distress. HEENT: [place] AT, moist mucus membranes.  Trachea midline, no masses. Cardiovascular: No clubbing, cyanosis, or edema. RRR.   Respiratory: Normal respiratory effort, no increased work of breathing. CTAB.   GI: Abdomen is soft, nontender, nondistended, no abdominal masses GU: No CVA tenderness.  Skin: No rashes, bruises or suspicious lesions. Lymph: No cervical or inguinal adenopathy. Neurologic: Grossly intact, no focal deficits, moving all 4 extremities. Psychiatric: Normal mood and affect.  Laboratory Data: Comprehensive Metabolic Panel (CMP) - Final result (10/02/2014 8:33 AM) Comprehensive Metabolic Panel (CMP) - Final result (10/02/2014 8:33 AM)  Component Value Range  Glucose 104 70-110 mg/dL  Sodium 140 136-145 mmol/L  Potassium 4.3 3.6-5.1 mmol/L  Chloride 104 97-109 mmol/L  Carbon Dioxide (CO2) 27.3 22.0-32.0 mmol/L  Urea Nitrogen (BUN) 25 7-25 mg/dL  Creatinine 1.0 0.6-1.1 mg/dL  Glomerular Filtration Rate (eGFR), MDRD Estimate 55 (L) >60 mL/min/1.73sq m  Calcium 9.7 8.7-10.3 mg/dL  AST  36 8-39 U/L  ALT  44 (H) 5-38 U/L  Alk Phos (alkaline Phosphatase) 85 34-104 U/L  Albumin 4.0 3.5-4.8 g/dL  Bilirubin, Total 0.4 0.3-1.2 mg/dL  Protein, Total 6.4 6.1-7.9 g/dL  A/G Ratio 1.7      Urinalysis Urine dipstick shows positive for RBC's and positive for  leukocytes.  Micro exam: 11-30 WBC's per HPF, 3-10 RBC's per HPF, few bacteria and amorphous crystals seen.  Results for orders placed or performed in visit on 11/07/14  Microscopic Examination  Result Value Ref Range   WBC, UA 11-30 (A) 0 -  5 /hpf   RBC, UA 3-10 (A) 0 -  2 /hpf   Epithelial Cells (non renal) 0-10 0 - 10 /hpf   Casts Present (A) None seen /lpf   Cast Type Amorphous Sediment N/A   Bacteria, UA Few None seen/Few  Urinalysis, Complete  Result Value Ref Range   Specific Gravity, UA 1.020 1.005 - 1.030   pH, UA 7.5 5.0 -   7.5   Color, UA Yellow Yellow   Appearance Ur Cloudy (A) Clear   Leukocytes, UA 3+ (A) Negative   Protein, UA Negative Negative/Trace   Glucose, UA Negative Negative   Ketones, UA Negative Negative   RBC, UA Trace (A) Negative   Bilirubin, UA Negative Negative   Urobilinogen, Ur 0.2 0.2 - 1.0 mg/dL   Nitrite, UA Negative Negative   Microscopic Examination See below:      Pertinent Imaging: CT abdomen and pelvis with IV contrast, 11/01/2014 ([facility])   Comparison:Abdominal CT 02/19/2010 (outside study).    Indication:R10.9 Unspecified abdominal pain, K43.9 Ventral hernia without  obstruction or gangrene, Evaluate for ventral hernia..    Technique:CT imaging was performed of the abdomen and pelvis following  the uncomplicated administration of intravenous contrast (Isovue-300, 150  mL at 3 mL/sec).Iodinated contrast was used due to the indications for  the examination, to improve disease detection and further define anatomy.  The most recent serum creatinine is 1.0 mg/dL. Coronal reformatted images  were generated and reviewed.    Findings:     Limited views of the lung bases are unremarkable. Postsurgical changes  status post bilateral breast augmentation noted.    The liver is normal in size, contour, and attenuation. Patent portal veins.  The hepatic veins are not well visualized due to phase of contrast. Post   surgical changes status post Whipple are redemonstrated. The anastomoses  appear patent. The remaining pancreas is normal in appearance, without  peripancreatic fluid or edema. There is no pancreatic ductal dilatation.  Mildly enlarged nodes are again seen in the porta hepatis. A reference  lymph node measures 1.1 cm in short axis, previously 1.1 cm (series 2,  image 37). The gallbladder is surgically absent. The spleen is normal.     The right adrenal gland is normal. There is a 1.2 cm hypoattenuating lesion  on the left adrenal gland (series 2, image 33), grossly stable compared to  remote prior. A subcentimeter hypodense lesion in the superior pole of the  right kidney is too small for CT characterization (series 2, image 41). No  right hydronephrosis or ureterectasis. There is a 1.3 x 0.5 cm obstructing  calculus in the left distal ureter (series 2, image 121 ) with associated  upstream ureterectasis and moderate/severe hydronephrosis. Slightly delayed  nephrogram on the left. Small cystocele.     The small and large bowel are normal in caliber throughout. There is no  focal or diffuse bowel wall thickening. The appendix is not definitively  seen, but there are no secondary signs of appendicitis. Hernia tacks along  the anterior abdominal wall are present, in keeping with prior hernia  repair. There is diastases of the rectus muscles, with no evidence of  recurrent hernia.    The abdominal aorta is normal in caliber. Atherosclerotic ossifications are  seen in the aorta and its branch vessels.    No aggressive appearing osseous lesions. Multilevel degenerative changes of  the thoracolumbar spine. The patient is status post right total hip  arthroplasty.    Impression:    1. 1.3 x 0.5 cm obstructing calculus in the left distal ureter, with  associated stranding ureterectasis and moderate/severe left hydronephrosis.    2. Diastases of the  rectus muscle status post ventral hernia repair, with  no evidence of recurrent hernia.  3. No evidence of recurrent or metastatic disease in the abdomen or pelvis.    Dr. Hessie Dibble discussed these findings with Dr. Dossie Der at [phone] on  11/01/2014.      The preliminary report (critical or emergent communication) was reviewed  prior to this dictation and there are no substantial differences between  the preliminary results and the impressions in this final report.      Electronically Reviewed ZW:CHEN Shropshire, MD  Electronically Reviewed on:11/02/2014 9:40 AM   Assessment & Plan:  65 y o F with large 1.3 cm obstructing left distal ureteral stone with moderate/ severe hydronephrosis.  I suspect based on the scan and lack of symptoms, that the stone has been present for quite some time.  STAT labs ordered today, patient advised to be NPO at [facility] in case urgency intervention needed tomorrow.  If labs stable, will plan for treatment of stone next week.  In the interim, she was advised to present to ED emergently if becomes symptomatic or develops fevers or chills.    Risks and benefits of ureteroscopy were reviewed including but not limited to infection, bleeding, pain, ureteral injury which could require open surgery versus prolonged indwelling if ureteralperforation occurs, persistent stone disease, requirement for staged procedure, possible stent, and global anesthesia risks. Patient expressed understanding and desires to proceed with ureteroscopy.  We also discussed ESWL but given the size and location of the stone, feel that URS is the best approach.   1. Left nephrolithiasis - Urinalysis, Complete - CULTURE, URINE COMPREHENSIVE - CBC with Differential/Platelet - Basic metabolic panel  2. Hydronephrosis, left   3. Acute cystitis without hematuria cipro bid x 7 days, f/u urine culture and adjust as needed  I spent 30 min with this patient of which greater than 50% was  spent in counseling and coordination of care with the patient.   Return for surgery to be scheduled.  Hollice Espy, MD  [facility] [address] [place], [place] [address] [phone]

## 2014-11-13 NOTE — Transfer of Care (Signed)
Immediate Anesthesia Transfer of Care Note  Patient: Kimberly Meyer  Procedure(s) Performed: Procedure(s): CYSTOSCOPY/URETEROSCOPY/HOLMIUM LASER/STENT PLACEMENT (Left)  Patient Location: PACU  Anesthesia Type:General  Level of Consciousness: sedated  Airway & Oxygen Therapy: Patient Spontanous Breathing and Patient connected to face mask oxygen  Post-op Assessment: Report given to RN and Post -op Vital signs reviewed and stable  Post vital signs: Reviewed and stable  Last Vitals:  Filed Vitals:   11/13/14 0829  BP: 93/62  Pulse: 83  Temp: 36.1 C  Resp: 18    Complications: No apparent anesthesia complications

## 2014-11-13 NOTE — Anesthesia Preprocedure Evaluation (Addendum)
Anesthesia Evaluation  Patient identified by MRN, date of birth, ID band Patient awake    Reviewed: Allergy & Precautions, H&P , NPO status , Patient's Chart, lab work & pertinent test results, reviewed documented beta blocker date and time   Airway Mallampati: II  TM Distance: >3 FB Neck ROM: limited    Dental no notable dental hx. (+) Teeth Intact   Pulmonary former smoker,  breath sounds clear to auscultation  Pulmonary exam normal       Cardiovascular hypertension, Normal cardiovascular exam+ dysrhythmias Atrial Fibrillation Rhythm:regular Rate:Normal     Neuro/Psych  Headaches, PSYCHIATRIC DISORDERS Anxiety    GI/Hepatic Neg liver ROS, GERD-  Controlled,  Endo/Other  negative endocrine ROS  Renal/GU Renal disease  negative genitourinary   Musculoskeletal   Abdominal   Peds  Hematology negative hematology ROS (+)   Anesthesia Other Findings Past Medical History:   Kidney stone                                                 Hyperlipemia                                                 GERD (gastroesophageal reflux disease)                       Chronic neck pain                                            IBS (irritable bowel syndrome)                               Ampullary carcinoma                                          Low back pain                                                History of pulmonary embolus (PE)                            Hypertension                                                 Atrial fibrillation                                          Situational stress  Chronic tension headache                                     Anxiety                                                      Anemia                                                       Reproductive/Obstetrics negative OB ROS                            Anesthesia  Physical Anesthesia Plan  ASA: III  Anesthesia Plan: General   Post-op Pain Management:    Induction:   Airway Management Planned:   Additional Equipment:   Intra-op Plan:   Post-operative Plan:   Informed Consent: I have reviewed the patients History and Physical, chart, labs and discussed the procedure including the risks, benefits and alternatives for the proposed anesthesia with the patient or authorized representative who has indicated his/her understanding and acceptance.   Dental Advisory Given  Plan Discussed with: Anesthesiologist, CRNA and Surgeon  Anesthesia Plan Comments:         Anesthesia Quick Evaluation

## 2014-11-14 ENCOUNTER — Ambulatory Visit: Payer: Self-pay | Admitting: Urology

## 2014-11-14 ENCOUNTER — Other Ambulatory Visit: Payer: Self-pay

## 2014-11-14 ENCOUNTER — Telehealth: Payer: Self-pay

## 2014-11-14 DIAGNOSIS — G8918 Other acute postprocedural pain: Secondary | ICD-10-CM

## 2014-11-14 MED ORDER — HYDROCODONE-ACETAMINOPHEN 5-325 MG PO TABS: 1.0000 | ORAL_TABLET | Freq: Four times a day (QID) | ORAL | Status: DC | PRN

## 2014-11-14 NOTE — Telephone Encounter (Signed)
[facility] called stating pt was given post op paper work that stated pt is supposed to have 3 different medications, but nothing was sent to pharmacy nor did pt have any scripts in hand. Per Dr. Cherrie Gauze op note nurse called in tamsulosin qd x7, oxybutnin q8h/prn x15 pills, both medications no refills. Pain medication was printed in office for pt to pick up.

## 2014-11-19 ENCOUNTER — Encounter: Payer: Self-pay | Admitting: Urology

## 2014-11-30 ENCOUNTER — Ambulatory Visit
Admission: RE | Admit: 2014-11-30 | Discharge: 2014-11-30 | Disposition: A | Discharge status: Home / Self Care | Payer: Medicare Other | Source: Ambulatory Visit | Attending: Urology | Admitting: Urology

## 2014-11-30 DIAGNOSIS — Z09 Encounter for follow-up examination after completed treatment for conditions other than malignant neoplasm: Secondary | ICD-10-CM | POA: Insufficient documentation

## 2014-11-30 DIAGNOSIS — N200 Calculus of kidney: Secondary | ICD-10-CM

## 2014-11-30 DIAGNOSIS — Z9889 Other specified postprocedural states: Secondary | ICD-10-CM | POA: Diagnosis present

## 2014-11-30 DIAGNOSIS — N201 Calculus of ureter: Secondary | ICD-10-CM | POA: Insufficient documentation

## 2014-12-04 ENCOUNTER — Ambulatory Visit (INDEPENDENT_AMBULATORY_CARE_PROVIDER_SITE_OTHER): Payer: Medicare Other | Admitting: Urology

## 2014-12-04 ENCOUNTER — Encounter: Payer: Self-pay | Admitting: Urology

## 2014-12-04 ENCOUNTER — Other Ambulatory Visit: Payer: PRIVATE HEALTH INSURANCE | Admitting: Urology

## 2014-12-04 VITALS — BP 116/73 | HR 97 | Ht 60.0 in | Wt 113.9 lb

## 2014-12-04 DIAGNOSIS — N200 Calculus of kidney: Secondary | ICD-10-CM

## 2014-12-04 DIAGNOSIS — N1330 Unspecified hydronephrosis: Secondary | ICD-10-CM | POA: Diagnosis not present

## 2014-12-04 LAB — URINALYSIS, COMPLETE
Bilirubin, UA: NEGATIVE
Glucose, UA: NEGATIVE
Ketones, UA: NEGATIVE
Nitrite, UA: NEGATIVE
Protein, UA: NEGATIVE
RBC, UA: NEGATIVE
Specific Gravity, UA: 1.02 (ref 1.005–1.030)
Urobilinogen, Ur: 0.2 mg/dL (ref 0.2–1.0)
pH, UA: 7 (ref 5.0–7.5)

## 2014-12-04 LAB — MICROSCOPIC EXAMINATION
Epithelial Cells (non renal): NONE SEEN /hpf (ref 0–10)
RBC, UA: NONE SEEN /hpf (ref 0–?)

## 2014-12-04 NOTE — Progress Notes (Signed)
9:47 AM   Kimberly Meyer 03-09-1946 [id number]  Referring provider: Marinda Elk, MD [address] [place], [place] [address]  Chief Complaint  Patient presents with  . Nephrolithiasis    HPI: 69 yo M with history of nephrolithiasis who underwent CT with contrast at [facility] on 11/01/14 for work up of epigastric pain over the past few months.  She underwent Wipple 01/2009 and contacted her surgeon at [facility] for further evaluation who ordered the scan as part of his work up.  On CT scan, she was noted to have an incidental 1.3 cm left distal ureteral stone with moderate to severe hydronephrosis.    She was taken to the operating room and 11/13/2014 for left ureteroscopy, laser lithotripsy, left ureteral stent placement. The procedure was uncomplicated. Her stent was left on a string which she was able to remove independently. She returns today for follow-up renal ultrasound.  She does have history of stones s/p ESWL fall 2015.    She has had metabolic work up with 24 urine and 2015 which showed very low urinary volume, 1.1 L.  Follow up RUS shows left fullness of the collecting system.    PMH: Past Medical History  Diagnosis Date  . Kidney stone   . Hyperlipemia   . GERD (gastroesophageal reflux disease)   . IBS (irritable bowel syndrome)   . Ampullary carcinoma   . Low back pain   . History of pulmonary embolus (PE)   . Hypertension   . Situational stress   . Chronic tension headache   . Anxiety   . Anemia     Surgical History: Past Surgical History  Procedure Laterality Date  . Lithotripsy    . Bladder surgery      bladder tack  . Hip surgery    . Appendectomy    . Colonoscopy    . Tubal ligation    . Placement of breast implants    . Cholecystectomy    . 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 Cystoscopy/ureteroscopy/holmium laser/stent placement Left 11/13/2014    Procedure: CYSTOSCOPY/URETEROSCOPY/HOLMIUM LASER/STENT PLACEMENT;   Surgeon: Hollice Espy, MD;  Location: [facility] ORS;  Service: Urology;  Laterality: Left;    Home Medications:    Medication List       This list is accurate as of: 12/04/14  9:47 AM.  Always use your most recent med list.               aspirin 81 MG tablet  Take 81 mg by mouth daily.     buPROPion 150 MG 12 hr tablet  Commonly known as:  WELLBUTRIN SR  Take by mouth.     fexofenadine 180 MG tablet  Commonly known as:  ALLEGRA  Take by mouth.     fluticasone 50 MCG/ACT nasal spray  Commonly known as:  FLONASE  Place into the nose.     lisinopril 10 MG tablet  Commonly known as:  PRINIVIL,ZESTRIL  Take by mouth.     METAMUCIL SMOOTH TEXTURE 28.3 % Powd  Generic drug:  Psyllium  Take by mouth. 3 TSP IN  8 OZ WATER NIGHTLY     MULTI-VITAMINS Tabs  Take by mouth.     Omega 3 1000 MG Caps  Take by mouth.     oxybutynin 5 MG tablet  Commonly known as:  DITROPAN  Take 1 tablet (5 mg total) by mouth every 8 (eight) hours as needed for bladder spasms.  Potassium Citrate 15 MEQ (1620 MG) Tbcr  Take by mouth.     predniSONE 5 MG tablet  Commonly known as:  DELTASONE  6 po day one decrease by 53m qd til gone.     tamsulosin 0.4 MG Caps capsule  Commonly known as:  FLOMAX  Take 1 capsule (0.4 mg total) by mouth daily.     ZENPEP 20000 UNITS Cpep  Generic drug:  Pancrelipase (Lip-Prot-Amyl)  TAKE AS DIRECTED BEFORE MEALS AND SNACKS        Allergies:  Allergies  Allergen Reactions  . Sulfa Antibiotics Other (See Comments)    Does not remember reaction-from childhood    Family History: Family History  Problem Relation Age of Onset  . Alzheimer's disease Father   . Kidney Stones Father   . Stroke Mother     Social History:  reports that she has quit smoking. She has never used smokeless tobacco. She reports that she does not drink alcohol or use illicit drugs.   Review of Systems UROLOGY Frequent Urination?: No Hard to postpone urination?:  No Burning/pain with urination?: No Get up at night to urinate?: No Leakage of urine?: No Urine stream starts and stops?: No Trouble starting stream?: No Do you have to strain to urinate?: No Blood in urine?: No Urinary tract infection?: No Sexually transmitted disease?: No Injury to kidneys or bladder?: No Painful intercourse?: No Weak stream?: No Currently pregnant?: No Vaginal bleeding?: No Last menstrual period?: 2008 Gastrointestinal Nausea?: No Vomiting?: No Indigestion/heartburn?: No Diarrhea?: No Constipation?: No Constitutional Fever: No Night sweats?: No Weight loss?: No Fatigue?: No Skin Skin rash/lesions?: No Itching?: No Eyes Blurred vision?: No Double vision?: No Ears/Nose/Throat Sore throat?: No Sinus problems?: No Hematologic/Lymphatic Swollen glands?: No Easy bruising?: No Cardiovascular Leg swelling?: No Chest pain?: No Respiratory Cough?: No Shortness of breath?: No Endocrine Excessive thirst?: No Musculoskeletal Back pain?: No Joint pain?: No Neurological Headaches?: No Dizziness?: No Psychologic Depression?: No Anxiety?: No   Physical Exam: BP 116/73 mmHg  Pulse 97  Ht 5' (1.524 m)  Wt 113 lb 14.4 oz (51.665 kg)  BMI 22.24 kg/m2  Constitutional:  Alert and oriented, No acute distress. HEENT: [place] AT, moist mucus membranes.  Trachea midline, no masses. Cardiovascular: No clubbing, cyanosis, or edema. Respiratory: Normal respiratory effort, no increased work of breathing. GI: Abdomen is soft, nontender, nondistended, no abdominal masses GU: No CVA tenderness.  Skin: No rashes, bruises or suspicious lesions. Neurologic: Grossly intact, no focal deficits, moving all 4 extremities. Psychiatric: Normal mood and affect.  Laboratory Data: Comprehensive Metabolic Panel (CMP) - Final result (10/02/2014 8:33 AM) Comprehensive Metabolic Panel (CMP) - Final result (10/02/2014 8:33 AM)  Component Value Range  Glucose 104 70-110 mg/dL   Sodium 140 136-145 mmol/L  Potassium 4.3 3.6-5.1 mmol/L  Chloride 104 97-109 mmol/L  Carbon Dioxide (CO2) 27.3 22.0-32.0 mmol/L  Urea Nitrogen (BUN) 25 7-25 mg/dL  Creatinine 1.0 0.6-1.1 mg/dL  Glomerular Filtration Rate (eGFR), MDRD Estimate 55 (L) >60 mL/min/1.73sq m  Calcium 9.7 8.7-10.3 mg/dL  AST  36 8-39 U/L  ALT  44 (H) 5-38 U/L  Alk Phos (alkaline Phosphatase) 85 34-104 U/L  Albumin 4.0 3.5-4.8 g/dL  Bilirubin, Total 0.4 0.3-1.2 mg/dL  Protein, Total 6.4 6.1-7.9 g/dL  A/G Ratio 1.7      Urinalysis Results for orders placed or performed in visit on 12/04/14  Microscopic Examination  Result Value Ref Range   WBC, UA 0-5 0 -  5 /hpf   RBC, UA None  seen 0 -  2 /hpf   Epithelial Cells (non renal) None seen 0 - 10 /hpf   Crystals Present (A) N/A   Crystal Type Amorphous Sediment N/A   Bacteria, UA Few None seen/Few  Urinalysis, Complete  Result Value Ref Range   Specific Gravity, UA 1.020 1.005 - 1.030   pH, UA 7.0 5.0 - 7.5   Color, UA Yellow Yellow   Appearance Ur Cloudy (A) Clear   Leukocytes, UA Trace (A) Negative   Protein, UA Negative Negative/Trace   Glucose, UA Negative Negative   Ketones, UA Negative Negative   RBC, UA Negative Negative   Bilirubin, UA Negative Negative   Urobilinogen, Ur 0.2 0.2 - 1.0 mg/dL   Nitrite, UA Negative Negative   Microscopic Examination See below:      Pertinent Imaging: RUS 11/30/14 CLINICAL DATA: Known left distal ureteral stone, patient is status post lithotripsy  EXAM: RENAL / URINARY TRACT ULTRASOUND COMPLETE  COMPARISON: KUB of March 14, 2014  FINDINGS: Right Kidney:  Length: 10.6 cm. Echogenicity within normal limits. No mass or hydronephrosis visualized.  Left Kidney:  Length: 10.7 cm. There is mild splitting of the central echo complex which may reflect hydronephrosis. No discrete collecting system stone is observed.  Bladder:  Appears normal for degree of bladder distention. Bilateral  ureteral jets are observed. No bladder stones are demonstrated.  IMPRESSION: Possible mild left-sided hydronephrosis without definite evidence of a stone. The examination is otherwise normal.   Electronically Signed  By: David Martinique M.D.  On: 11/30/2014 15:54   Assessment & Plan:  52 y o F with large 1.3 cm obstructing left distal ureteral stone with moderate/ severe hydronephrosis status post left ureteroscopy proximal line 1 month ago. Follow-up imaging shows some mild residual fullness elect collecting system but no overt hydronephrosis. I suspect this mild fullness is related to likely chronic obstruction by the large distal stone and should resolve with time. She is asymptomatic today. We reviewed her previous 52-DIYM metabolic workup results.  1. Left nephrolithiasis We discussed general stone prevention techniques including drinking plenty water with goal of producing 2.5 L urine daily, increased citric acid intake, avoidance of high oxalate containing foods, and decreased salt intake.  Information about dietary recommendations given today. We primarily discussed increasing fluid intake as her previous 24 hour metabolic workup was consistent with very low urinary volume. - Urinalysis, Complete  2. Hydronephrosis, left Recommend follow-up renal ultrasound in 6 weeks to ensure complete resolution of left collecting system fullness, will call with results. Plan for follow-up with KUB 6 months from now.  Return in about 6 months (around 06/06/2015) for KUB follow up (plan to call with RUS results in 6 weeks).  Hollice Espy, MD  [facility] [address] [place], [place] [address] [phone]

## 2015-04-30 ENCOUNTER — Other Ambulatory Visit: Payer: Self-pay | Admitting: Physician Assistant

## 2015-04-30 DIAGNOSIS — Z1231 Encounter for screening mammogram for malignant neoplasm of breast: Secondary | ICD-10-CM

## 2015-05-03 ENCOUNTER — Ambulatory Visit
Admission: RE | Admit: 2015-05-03 | Discharge: 2015-05-03 | Disposition: A | Discharge status: Home / Self Care | Payer: Medicare Other | Source: Ambulatory Visit | Attending: Physician Assistant | Admitting: Physician Assistant

## 2015-05-03 ENCOUNTER — Other Ambulatory Visit: Payer: Self-pay | Admitting: Physician Assistant

## 2015-05-03 DIAGNOSIS — R1032 Left lower quadrant pain: Secondary | ICD-10-CM

## 2015-05-03 DIAGNOSIS — I700 Atherosclerosis of aorta: Secondary | ICD-10-CM | POA: Diagnosis not present

## 2015-05-03 DIAGNOSIS — R1031 Right lower quadrant pain: Secondary | ICD-10-CM | POA: Insufficient documentation

## 2015-05-03 DIAGNOSIS — R945 Abnormal results of liver function studies: Secondary | ICD-10-CM

## 2015-05-03 MED ORDER — IOHEXOL 350 MG/ML SOLN
75.0000 mL | Freq: Once | INTRAVENOUS | Status: AC | PRN
  Administered 2015-05-03: 75 mL via INTRAVENOUS

## 2015-05-06 ENCOUNTER — Other Ambulatory Visit: Payer: Self-pay | Admitting: Physician Assistant

## 2015-05-06 DIAGNOSIS — S22089A Unspecified fracture of T11-T12 vertebra, initial encounter for closed fracture: Secondary | ICD-10-CM

## 2015-05-10 ENCOUNTER — Ambulatory Visit
Admission: RE | Admit: 2015-05-10 | Discharge: 2015-05-10 | Disposition: A | Discharge status: Home / Self Care | Payer: Medicare Other | Source: Ambulatory Visit | Attending: Physician Assistant | Admitting: Physician Assistant

## 2015-05-10 DIAGNOSIS — R29890 Loss of height: Secondary | ICD-10-CM | POA: Diagnosis not present

## 2015-05-10 DIAGNOSIS — M47814 Spondylosis without myelopathy or radiculopathy, thoracic region: Secondary | ICD-10-CM | POA: Diagnosis not present

## 2015-05-10 DIAGNOSIS — S22089A Unspecified fracture of T11-T12 vertebra, initial encounter for closed fracture: Secondary | ICD-10-CM | POA: Diagnosis present

## 2015-05-24 ENCOUNTER — Ambulatory Visit: Payer: Medicare Other

## 2015-05-30 ENCOUNTER — Ambulatory Visit: Payer: Medicare Other | Attending: Physician Assistant

## 2015-06-03 ENCOUNTER — Encounter: Payer: Self-pay | Admitting: *Deleted

## 2015-06-03 ENCOUNTER — Inpatient Hospital Stay
Admission: RE | Admit: 2015-06-03 | Discharge: 2015-06-03 | Disposition: A | Discharge status: Home / Self Care | Payer: Medicare Other | Source: Ambulatory Visit

## 2015-06-03 NOTE — Pre-Procedure Instructions (Signed)
  Vent Rate (bpm) 81   PR Interval (msec) 164   QRS Interval (msec) 72   QT Interval (msec) 372   QTc (msec) 432    Result Narrative  Normal sinus rhythm RSR' in V1 or V2 Otherwise normal ECG  When compared with ECG of 05-Dec-2014 10:35, RSR' in V1 or V2 is now present I reviewed and concur with this report. Electronically signed [id number], MD, DOUGLAS [phone]) on 12/13/2014 10:38:15 PM

## 2015-06-04 ENCOUNTER — Ambulatory Visit: Payer: Medicare Other | Admitting: Anesthesiology

## 2015-06-04 ENCOUNTER — Encounter: Payer: Self-pay | Admitting: *Deleted

## 2015-06-04 ENCOUNTER — Encounter
Admission: RE | Disposition: A | Discharge status: Home / Self Care | Payer: Self-pay | Source: Ambulatory Visit | Attending: Orthopedic Surgery

## 2015-06-04 ENCOUNTER — Ambulatory Visit
Admission: RE | Admit: 2015-06-04 | Discharge: 2015-06-04 | Disposition: A | Discharge status: Home / Self Care | Payer: Medicare Other | Source: Ambulatory Visit | Attending: Orthopedic Surgery | Admitting: Orthopedic Surgery

## 2015-06-04 ENCOUNTER — Ambulatory Visit: Payer: Medicare Other

## 2015-06-04 DIAGNOSIS — K219 Gastro-esophageal reflux disease without esophagitis: Secondary | ICD-10-CM | POA: Diagnosis not present

## 2015-06-04 DIAGNOSIS — E785 Hyperlipidemia, unspecified: Secondary | ICD-10-CM | POA: Diagnosis not present

## 2015-06-04 DIAGNOSIS — Z7982 Long term (current) use of aspirin: Secondary | ICD-10-CM | POA: Diagnosis not present

## 2015-06-04 DIAGNOSIS — M549 Dorsalgia, unspecified: Secondary | ICD-10-CM | POA: Insufficient documentation

## 2015-06-04 DIAGNOSIS — Z90410 Acquired total absence of pancreas: Secondary | ICD-10-CM | POA: Insufficient documentation

## 2015-06-04 DIAGNOSIS — Z820 Family history of epilepsy and other diseases of the nervous system: Secondary | ICD-10-CM | POA: Insufficient documentation

## 2015-06-04 DIAGNOSIS — G8929 Other chronic pain: Secondary | ICD-10-CM | POA: Insufficient documentation

## 2015-06-04 DIAGNOSIS — Z86711 Personal history of pulmonary embolism: Secondary | ICD-10-CM | POA: Insufficient documentation

## 2015-06-04 DIAGNOSIS — M542 Cervicalgia: Secondary | ICD-10-CM | POA: Insufficient documentation

## 2015-06-04 DIAGNOSIS — R51 Headache: Secondary | ICD-10-CM | POA: Insufficient documentation

## 2015-06-04 DIAGNOSIS — I10 Essential (primary) hypertension: Secondary | ICD-10-CM | POA: Insufficient documentation

## 2015-06-04 DIAGNOSIS — Z79899 Other long term (current) drug therapy: Secondary | ICD-10-CM | POA: Diagnosis not present

## 2015-06-04 DIAGNOSIS — Z8589 Personal history of malignant neoplasm of other organs and systems: Secondary | ICD-10-CM | POA: Insufficient documentation

## 2015-06-04 DIAGNOSIS — Z419 Encounter for procedure for purposes other than remedying health state, unspecified: Secondary | ICD-10-CM

## 2015-06-04 DIAGNOSIS — Z833 Family history of diabetes mellitus: Secondary | ICD-10-CM | POA: Diagnosis not present

## 2015-06-04 DIAGNOSIS — Z823 Family history of stroke: Secondary | ICD-10-CM | POA: Insufficient documentation

## 2015-06-04 DIAGNOSIS — K589 Irritable bowel syndrome without diarrhea: Secondary | ICD-10-CM | POA: Diagnosis not present

## 2015-06-04 DIAGNOSIS — Z8249 Family history of ischemic heart disease and other diseases of the circulatory system: Secondary | ICD-10-CM | POA: Diagnosis not present

## 2015-06-04 DIAGNOSIS — Z978 Presence of other specified devices: Secondary | ICD-10-CM | POA: Insufficient documentation

## 2015-06-04 DIAGNOSIS — Z87442 Personal history of urinary calculi: Secondary | ICD-10-CM | POA: Diagnosis not present

## 2015-06-04 DIAGNOSIS — Z9049 Acquired absence of other specified parts of digestive tract: Secondary | ICD-10-CM | POA: Diagnosis not present

## 2015-06-04 DIAGNOSIS — M4854XA Collapsed vertebra, not elsewhere classified, thoracic region, initial encounter for fracture: Secondary | ICD-10-CM | POA: Diagnosis present

## 2015-06-04 DIAGNOSIS — Z882 Allergy status to sulfonamides status: Secondary | ICD-10-CM | POA: Diagnosis not present

## 2015-06-04 HISTORY — PX: KYPHOPLASTY: SHX5884

## 2015-06-04 SURGERY — KYPHOPLASTY
Anesthesia: General | Site: Spine Thoracic | Wound class: Clean

## 2015-06-04 MED ORDER — MIDAZOLAM HCL 2 MG/2ML IJ SOLN
INTRAMUSCULAR | Status: DC | PRN
  Administered 2015-06-04 (×2): 1 mg via INTRAVENOUS

## 2015-06-04 MED ORDER — PROPOFOL 500 MG/50ML IV EMUL
INTRAVENOUS | Status: DC | PRN
  Administered 2015-06-04: 50 ug/kg/min via INTRAVENOUS

## 2015-06-04 MED ORDER — IOHEXOL 180 MG/ML  SOLN
INTRAMUSCULAR | Status: DC | PRN
  Administered 2015-06-04: 20 mL

## 2015-06-04 MED ORDER — ONDANSETRON HCL 4 MG/2ML IJ SOLN: 4.0000 mg | Freq: Once | INTRAMUSCULAR | Status: DC | PRN

## 2015-06-04 MED ORDER — IOHEXOL 180 MG/ML  SOLN
INTRAMUSCULAR | Status: AC
  Filled 2015-06-04: qty 40

## 2015-06-04 MED ORDER — LACTATED RINGERS IV SOLN
INTRAVENOUS | Status: DC
  Administered 2015-06-04 (×2): via INTRAVENOUS

## 2015-06-04 MED ORDER — FENTANYL CITRATE (PF) 100 MCG/2ML IJ SOLN
INTRAMUSCULAR | Status: AC
  Administered 2015-06-04: 25 ug via INTRAVENOUS
  Filled 2015-06-04: qty 2

## 2015-06-04 MED ORDER — METOCLOPRAMIDE HCL 10 MG PO TABS: 5.0000 mg | ORAL_TABLET | Freq: Three times a day (TID) | ORAL | Status: DC | PRN

## 2015-06-04 MED ORDER — CEFAZOLIN SODIUM-DEXTROSE 2-3 GM-% IV SOLR
INTRAVENOUS | Status: AC
  Filled 2015-06-04: qty 50

## 2015-06-04 MED ORDER — METOCLOPRAMIDE HCL 5 MG/ML IJ SOLN: 5.0000 mg | Freq: Three times a day (TID) | INTRAMUSCULAR | Status: DC | PRN

## 2015-06-04 MED ORDER — SODIUM CHLORIDE 0.9 % IV SOLN: INTRAVENOUS | Status: DC

## 2015-06-04 MED ORDER — LIDOCAINE HCL (PF) 1 % IJ SOLN
INTRAMUSCULAR | Status: AC
  Filled 2015-06-04: qty 60

## 2015-06-04 MED ORDER — FENTANYL CITRATE (PF) 100 MCG/2ML IJ SOLN
25.0000 ug | INTRAMUSCULAR | Status: DC | PRN
  Administered 2015-06-04 (×2): 25 ug via INTRAVENOUS

## 2015-06-04 MED ORDER — LIDOCAINE HCL 1 % IJ SOLN
INTRAMUSCULAR | Status: DC | PRN
  Administered 2015-06-04: 10 mL

## 2015-06-04 MED ORDER — HYDROCODONE-ACETAMINOPHEN 5-325 MG PO TABS: 1.0000 | ORAL_TABLET | ORAL | Status: DC | PRN

## 2015-06-04 MED ORDER — ONDANSETRON HCL 4 MG PO TABS: 4.0000 mg | ORAL_TABLET | Freq: Four times a day (QID) | ORAL | Status: DC | PRN

## 2015-06-04 MED ORDER — FAMOTIDINE 20 MG PO TABS
20.0000 mg | ORAL_TABLET | Freq: Once | ORAL | Status: AC
  Administered 2015-06-04: 20 mg via ORAL

## 2015-06-04 MED ORDER — FAMOTIDINE 20 MG PO TABS
ORAL_TABLET | ORAL | Status: AC
  Filled 2015-06-04: qty 1

## 2015-06-04 MED ORDER — HYDROCODONE-ACETAMINOPHEN 5-325 MG PO TABS: 1.0000 | ORAL_TABLET | Freq: Four times a day (QID) | ORAL | Status: DC | PRN

## 2015-06-04 MED ORDER — BUPIVACAINE-EPINEPHRINE (PF) 0.5% -1:200000 IJ SOLN
INTRAMUSCULAR | Status: AC
  Filled 2015-06-04: qty 30

## 2015-06-04 MED ORDER — LIDOCAINE HCL (PF) 1 % IJ SOLN
INTRAMUSCULAR | Status: AC
  Filled 2015-06-04: qty 2

## 2015-06-04 MED ORDER — BUPIVACAINE-EPINEPHRINE (PF) 0.5% -1:200000 IJ SOLN
INTRAMUSCULAR | Status: DC | PRN
  Administered 2015-06-04: 20 mL

## 2015-06-04 MED ORDER — ONDANSETRON HCL 4 MG/2ML IJ SOLN: 4.0000 mg | Freq: Four times a day (QID) | INTRAMUSCULAR | Status: DC | PRN

## 2015-06-04 MED ORDER — CEFAZOLIN SODIUM-DEXTROSE 2-3 GM-% IV SOLR
2.0000 g | Freq: Once | INTRAVENOUS | Status: AC
  Administered 2015-06-04: 2 g via INTRAVENOUS

## 2015-06-04 SURGICAL SUPPLY — 13 items
CEMENT BONE KYPHON CDS (Cement) ×1 IMPLANT
DEVICE BIOPSY BONE KYPHX (INSTRUMENTS) ×2 IMPLANT
DRAPE C-ARM XRAY 36X54 (DRAPES) ×2 IMPLANT
DURAPREP 26ML APPLICATOR (WOUND CARE) ×2 IMPLANT
GLOVE SURG ORTHO 9.0 STRL STRW (GLOVE) ×2 IMPLANT
GOWN SPECIALTY ULTRA XL (MISCELLANEOUS) ×2 IMPLANT
GOWN STRL REUS W/ TWL LRG LVL3 (GOWN DISPOSABLE) ×1 IMPLANT
GOWN STRL REUS W/TWL LRG LVL3 (GOWN DISPOSABLE) ×2
LIQUID BAND (GAUZE/BANDAGES/DRESSINGS) ×2 IMPLANT
PACK KYPHOPLASTY (MISCELLANEOUS) ×2 IMPLANT
STRAP SAFETY BODY (MISCELLANEOUS) ×2 IMPLANT
TRAY KYPHOPAK 15/3 EXPRESS 1ST (MISCELLANEOUS) ×2 IMPLANT
TRAY KYPHOPAK 20/3 EXPRESS 1ST (MISCELLANEOUS) ×1 IMPLANT

## 2015-06-04 NOTE — Progress Notes (Signed)
Ancef 2 gms - sent to OR with patient

## 2015-06-04 NOTE — Discharge Instructions (Addendum)
Minimize activities today, activities as tolerated tomorrow. Remove Band-Aid's on Thursday and okay to shower after that. Call if you have severe pain.  AMBULATORY SURGERY  DISCHARGE INSTRUCTIONS   1) The drugs that you were given will stay in your system until tomorrow so for the next 24 hours you should not:  A) Drive an automobile B) Make any legal decisions C) Drink any alcoholic beverage   2) You may resume regular meals tomorrow.  Today it is better to start with liquids and gradually work up to solid foods.  You may eat anything you prefer, but it is better to start with liquids, then soup and crackers, and gradually work up to solid foods.   3) Please notify your doctor immediately if you have any unusual bleeding, trouble breathing, redness and pain at the surgery site, drainage, fever, or pain not relieved by medication.    4) Additional Instructions:        Please contact your physician with any problems or Same Day Surgery at [phone], Monday through Friday 6 am to 4 pm, or [place] at [facility] number at [phone].

## 2015-06-04 NOTE — Op Note (Signed)
06/04/2015  3:55 PM  PATIENT:  Kimberly Meyer  70 y.o. female  PRE-OPERATIVE DIAGNOSIS:  compression wedge fracture T11 and T12  POST-OPERATIVE DIAGNOSIS:  compression wedge fracture T11 and T12   PROCEDURE:  Procedure(s): KYPHOPLASTY T11, T12 (N/A)  SURGEON: Laurene Footman, MD  ASSISTANTS: None  ANESTHESIA:   local and MAC  EBL:     BLOOD ADMINISTERED:none  DRAINS: none   LOCAL MEDICATIONS USED:  MARCAINE    and XYLOCAINE   SPECIMEN:  Source of Specimen:  T11 and T12 vertebral body  DISPOSITION OF SPECIMEN:  PATHOLOGY  COUNTS:  YES  TOURNIQUET:  * No tourniquets in log *  IMPLANTS: Bone cement  DICTATION: .Dragon Dictation patient brought the operating room and after adequate sedation was given, patient was placed prone. C-arm was brought in and good visualization of T11 and T12 was obtained. After patient identification and timeout procedure completed 5 cc 1% Xylocaine was infiltrated on the right at T12 on the left at T11. The back was then prepped and draped in the sterile fashion and repeat timeout procedure carried out. Spinal needle was then used to get local down to the pedicle on both sides a 50-50 mix of 1% Xylocaine have percent Sensorcaine 10 cc of each at each level. After allowing this to set a small incision was made first at T11 and trocar brought down to the pedicle and advanced into the vertebral body care being taken on AP and lateral projections to be certain that these spinal canal and neural foramen were not violated. Biopsy was obtained at T11 Drilling was carried out and then inflation of a balloon to about 3 cc with partial correction of deformity. Identical procedure was then carried out at T12. At this point the cement was mixed and approximately 3 cc of cement was infiltrated in the both vertebral bodies with care being taken to prevent extravasation. Afteradequate cement with interdigitation and crossed the midline and both levels was obtained trochars  were placed and when the cement was set trochars removed. Dermabond was used to close the skin followed by Band-Aids  PLAN OF CARE: Discharge to home after PACU  PATIENT DISPOSITION:  PACU - hemodynamically stable.

## 2015-06-04 NOTE — Anesthesia Preprocedure Evaluation (Addendum)
Anesthesia Evaluation  Patient identified by MRN, date of birth, ID band Patient awake    Reviewed: Allergy & Precautions, NPO status , Patient's Chart, lab work & pertinent test results  History of Anesthesia Complications Negative for: history of anesthetic complications  Airway Mallampati: II       Dental  (+) Teeth Intact   Pulmonary neg pulmonary ROS, former smoker,           Cardiovascular hypertension, Pt. on medications      Neuro/Psych negative neurological ROS     GI/Hepatic GERD  Medicated and Controlled,  Endo/Other    Renal/GU Renal disease (stones)     Musculoskeletal   Abdominal   Peds  Hematology  (+) anemia ,   Anesthesia Other Findings   Reproductive/Obstetrics                            Anesthesia Physical Anesthesia Plan  ASA: II  Anesthesia Plan: General   Post-op Pain Management:    Induction: Intravenous  Airway Management Planned: Nasal Cannula  Additional Equipment:   Intra-op Plan:   Post-operative Plan:   Informed Consent: I have reviewed the patients History and Physical, chart, labs and discussed the procedure including the risks, benefits and alternatives for the proposed anesthesia with the patient or authorized representative who has indicated his/her understanding and acceptance.     Plan Discussed with:   Anesthesia Plan Comments:         Anesthesia Quick Evaluation

## 2015-06-04 NOTE — Transfer of Care (Signed)
Immediate Anesthesia Transfer of Care Note  Patient: Kimberly Meyer  Procedure(s) Performed: Procedure(s): KYPHOPLASTY T11, T12 (N/A)  Patient Location: PACU  Anesthesia Type:General  Level of Consciousness: sedated  Airway & Oxygen Therapy: Patient Spontanous Breathing and Patient connected to nasal cannula oxygen  Post-op Assessment: Report given to RN and Post -op Vital signs reviewed and stable  Post vital signs: Reviewed and stable  Last Vitals:  Filed Vitals:   06/04/15 1317 06/04/15 1556  BP: 126/106 114/65  Pulse: 84 68  Temp: 36.2 C 36.2 C  Resp: 16 16    Complications: No apparent anesthesia complications

## 2015-06-04 NOTE — H&P (Signed)
Reviewed paper H+P, will be scanned into chart. No changes noted.  

## 2015-06-04 NOTE — Anesthesia Postprocedure Evaluation (Signed)
Anesthesia Post Note  Patient: Kimberly Meyer  Procedure(s) Performed: Procedure(s) (LRB): KYPHOPLASTY T11, T12 (N/A)  Patient location during evaluation: PACU Anesthesia Type: General Level of consciousness: awake and alert Pain management: pain level controlled Vital Signs Assessment: post-procedure vital signs reviewed and stable Respiratory status: spontaneous breathing and respiratory function stable Cardiovascular status: blood pressure returned to baseline and stable Anesthetic complications: no    Last Vitals:  Filed Vitals:   06/04/15 1317 06/04/15 1556  BP: 126/106 114/65  Pulse: 84 68  Temp: 36.2 C 36.2 C  Resp: 16 16    Last Pain:  Filed Vitals:   06/04/15 1602  PainSc: Asleep                 KEPHART,WILLIAM K

## 2015-06-05 ENCOUNTER — Encounter: Payer: Self-pay | Admitting: Orthopedic Surgery

## 2015-06-06 LAB — SURGICAL PATHOLOGY

## 2015-06-07 ENCOUNTER — Ambulatory Visit (INDEPENDENT_AMBULATORY_CARE_PROVIDER_SITE_OTHER): Payer: Medicare Other | Admitting: Urology

## 2015-06-07 ENCOUNTER — Ambulatory Visit
Admission: RE | Admit: 2015-06-07 | Discharge: 2015-06-07 | Disposition: A | Discharge status: Home / Self Care | Payer: Medicare Other | Source: Ambulatory Visit | Attending: Urology | Admitting: Urology

## 2015-06-07 ENCOUNTER — Ambulatory Visit: Payer: Medicare Other | Admitting: Urology

## 2015-06-07 ENCOUNTER — Encounter: Payer: Self-pay | Admitting: Urology

## 2015-06-07 VITALS — BP 98/63 | HR 91 | Ht 61.0 in | Wt 107.9 lb

## 2015-06-07 DIAGNOSIS — N200 Calculus of kidney: Secondary | ICD-10-CM

## 2015-06-07 DIAGNOSIS — Z87442 Personal history of urinary calculi: Secondary | ICD-10-CM | POA: Diagnosis not present

## 2015-06-07 DIAGNOSIS — N261 Atrophy of kidney (terminal): Secondary | ICD-10-CM

## 2015-06-07 DIAGNOSIS — C241 Malignant neoplasm of ampulla of Vater: Secondary | ICD-10-CM | POA: Insufficient documentation

## 2015-06-07 DIAGNOSIS — N1330 Unspecified hydronephrosis: Secondary | ICD-10-CM

## 2015-06-07 MED ORDER — POTASSIUM CITRATE ER 15 MEQ (1620 MG) PO TBCR: 1.0000 | EXTENDED_RELEASE_TABLET | Freq: Two times a day (BID) | ORAL | Status: DC

## 2015-06-07 NOTE — Progress Notes (Signed)
2:43 PM   06/07/2015   Kimberly Meyer [birth year] [id number]  Referring provider: Marinda Elk, MD [place] [facility] [place] [address]  Chief Complaint  Patient presents with  . Nephrolithiasis    72monthw/KUB     HPI: 70yo M who returns today for f/u stones.   Nephrolithiasis Pre-2016  Multiple recurrent stones.  s/p ESWL fall 2015.  24 urine 2015 which showed very low urinary volume, 1.1 L, started on potassium citrate.   10/2014 Incidental 1.3 cm left distal [facility] stone [facility]  S/p L URS, LL, stent on 10/2014.  Follow up RUS with L renal atrophy, significant improvement of L hydro (mild).  Never had follow up RUS as ordered/ recommended.  Interval history: No flank pain or gross hematuria.  She has not passed a stone since last visit.  Trying to increase water intake.  No UTIs.    Recent CT abd with contrast for work of abd pain on 05/03/15 revealed chronic left renal atrophy which has progressed "mildy" since 2011 with chronic dilation of the left proximal/ mid ureter without obstructing stone.    Cr 1 on 05/02/25.     PMH: Past Medical History  Diagnosis Date  . Kidney stone   . Hyperlipemia   . GERD (gastroesophageal reflux disease)   . IBS (irritable bowel syndrome)   . Low back pain   . History of pulmonary embolus (PE)   . Hypertension   . Situational stress   . Chronic tension headache   . Anxiety   . Anemia   . Ampullary carcinoma ([facility]     Surgical History: Past Surgical History  Procedure Laterality Date  . Lithotripsy    . Bladder surgery      bladder tack  . Hip surgery    . Appendectomy    . Colonoscopy    . Tubal ligation    . Placement of breast implants    . Cholecystectomy    . 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 Cystoscopy/ureteroscopy/holmium laser/stent placement Left 11/13/2014    Procedure: CYSTOSCOPY/URETEROSCOPY/HOLMIUM LASER/STENT  PLACEMENT;  Surgeon: AHollice Espy MD;  Location: [facility] ORS;  Service: Urology;  Laterality: Left;  .Marland KitchenVentral hernia repair    . Kyphoplasty N/A 06/04/2015    Procedure: KYPHOPLASTY T11, T12;  Surgeon: MHessie Knows MD;  Location: [facility] ORS;  Service: Orthopedics;  Laterality: N/A;    Home Medications:    Medication List       This list is accurate as of: 06/07/15  2:43 PM.  Always use your most recent med list.               aspirin 81 MG tablet  Take 81 mg by mouth daily.     buPROPion 150 MG 12 hr tablet  Commonly known as:  WELLBUTRIN SR  Take by mouth.     esomeprazole 20 MG capsule  Commonly known as:  NEXIUM  Take 20 mg by mouth daily at 12 noon.     fexofenadine 180 MG tablet  Commonly known as:  ALLEGRA  Take by mouth.     fluticasone 50 MCG/ACT nasal spray  Commonly known as:  FLONASE  Place into the nose.     lisinopril 10 MG tablet  Commonly known as:  PRINIVIL,ZESTRIL  Take by mouth.     METAMUCIL SMOOTH TEXTURE 28.3 % Powd  Generic drug:  Psyllium  Take by mouth. 3 TSP IN  8 OZ WATER NIGHTLY     MULTI-VITAMINS Tabs  Take by mouth.     Omega 3 1000 MG Caps  Take by mouth.     Potassium Citrate 15 MEQ (1620 MG) Tbcr  Take by mouth.     traMADol 50 MG tablet  Commonly known as:  ULTRAM  Take by mouth every 6 (six) hours as needed for moderate pain.     ZENPEP 20000 units Cpep  Generic drug:  Pancrelipase (Lip-Prot-Amyl)  TAKE AS DIRECTED BEFORE MEALS AND SNACKS        Allergies:  Allergies  Allergen Reactions  . Sulfa Antibiotics Other (See Comments)    Does not remember reaction-from childhood    Family History: Family History  Problem Relation Age of Onset  . Alzheimer's disease Father   . Kidney Stones Father   . Stroke Mother   . Diabetes type II Mother     Social History:  reports that she has quit smoking. She has never used smokeless tobacco. She reports that she does not drink alcohol or use illicit drugs.   Review of  Systems UROLOGY Frequent Urination?: Yes Hard to postpone urination?: No Burning/pain with urination?: No Get up at night to urinate?: No Leakage of urine?: Yes Urine stream starts and stops?: No Trouble starting stream?: No Do you have to strain to urinate?: No Blood in urine?: No Urinary tract infection?: No Sexually transmitted disease?: No Injury to kidneys or bladder?: No Painful intercourse?: No Weak stream?: No Currently pregnant?: No Vaginal bleeding?: No Last menstrual period?: n Gastrointestinal Nausea?: No Vomiting?: No Indigestion/heartburn?: No Diarrhea?: No Constipation?: No Constitutional Fever: No Night sweats?: No Weight loss?: No Fatigue?: No Skin Skin rash/lesions?: No Itching?: No Eyes Blurred vision?: No Double vision?: No Ears/Nose/Throat Sore throat?: No Sinus problems?: No Hematologic/Lymphatic Swollen glands?: No Easy bruising?: No Cardiovascular Leg swelling?: No Chest pain?: No Respiratory Cough?: No Shortness of breath?: No Endocrine Excessive thirst?: No Musculoskeletal Back pain?: Yes Joint pain?: No Neurological Headaches?: No Dizziness?: No Psychologic Depression?: No Anxiety?: No   Physical Exam: BP 98/63 mmHg  Pulse 91  Ht '5\' 1"'  (1.549 m)  Wt 107 lb 14.4 oz (48.943 kg)  BMI 20.40 kg/m2  Constitutional:  Alert and oriented, No acute distress. HEENT: [place] AT, moist mucus membranes.  Trachea midline, no masses. Cardiovascular: No clubbing, cyanosis, or edema. Respiratory: Normal respiratory effort, no increased work of breathing. GI: Abdomen is soft, nontender, nondistended, no abdominal masses GU: No CVA tenderness.  Skin: No rashes, bruises or suspicious lesions. Neurologic: Grossly intact, no focal deficits, moving all 4 extremities. Psychiatric: Normal mood and affect.  Laboratory Data: Basic Metabolic Panel (BMP) (02/27/1593 8:25 AM) Basic Metabolic Panel (BMP) (58/59/2924 8:25 AM)  Component Value Ref  Range  Glucose 105 70 - 110 mg/dL  Sodium 138 136 - 145 mmol/L  Potassium 4.1 3.6 - 5.1 mmol/L  Chloride 100 97 - 109 mmol/L  Carbon Dioxide (CO2) 30.8 22.0 - 32.0 mmol/L  Calcium 9.4 8.7 - 10.3 mg/dL  Urea Nitrogen (BUN) 19 7 - 25 mg/dL  Creatinine 1.0 0.6 - 1.1 mg/dL  Glomerular Filtration Rate (eGFR), MDRD Estimate 55 (L) >60 mL/min/1.73sq m  BUN/Crea Ratio 19.0 6.0 - 20.0  Anion Gap w/K 11.3 6.0 - 16.0    Pertinent Imaging: KUB today CLINICAL DATA: Nephrolithiasis.  EXAM: ABDOMEN - 1 VIEW  COMPARISON: March 14, 2014.  FINDINGS: The bowel gas pattern is normal. Large  stool burden is noted. Postsurgical changes related to prior hernia repair are noted. Status post right hip arthroplasty. Phleboliths are noted in the pelvis. Surgical sutures are noted in the central portion of the abdomen.  IMPRESSION: No evidence of bowel obstruction or ileus. Large amount of stool is noted suggesting constipation. Postsurgical changes are noted as described above.   Electronically Signed  By: Marijo Conception,   CT abd pelvis on 05/03/15 also personally reviewed today  Assessment & Plan:  65 y o F with recurrnet nephrolithasis.  1. Recurrent nephrolithiasis No evidence of stones on KUB today. Continue KCitrate Encouraged hydration - DG Abd 1 View; Future - NM Renal Imaging Flow W/Pharm; Future  2. Hydronephrosis, left Appears to be chronic with dilation of proximal left ureter at least since 2011 without residual obstructing stone Mild progression of left renal atrophy over time Cr normal Recommended obtaining baseline MAG3 lasix renogram to document overall left renal function an assess for possible chronic obstruction If not obstructed, will continue to follow She is aggreable with this plan  3. Left renal atrophy As above  RTC in 2 months to review results  No Follow-up on file.  Hollice Espy, MD  [facility] [address] [place], [place] [address] [phone]

## 2015-06-08 ENCOUNTER — Encounter: Payer: Self-pay | Admitting: Urology

## 2015-06-24 ENCOUNTER — Encounter
Admission: RE | Admit: 2015-06-24 | Discharge: 2015-06-24 | Disposition: A | Discharge status: Home / Self Care | Payer: Medicare Other | Source: Ambulatory Visit | Attending: Urology | Admitting: Urology

## 2015-06-24 DIAGNOSIS — N200 Calculus of kidney: Secondary | ICD-10-CM | POA: Insufficient documentation

## 2015-07-22 ENCOUNTER — Encounter
Admission: RE | Disposition: A | Discharge status: Home / Self Care | Payer: Self-pay | Source: Ambulatory Visit | Attending: Unknown Physician Specialty

## 2015-07-22 ENCOUNTER — Encounter: Payer: Self-pay | Admitting: *Deleted

## 2015-07-22 ENCOUNTER — Ambulatory Visit: Payer: Medicare Other | Admitting: Anesthesiology

## 2015-07-22 ENCOUNTER — Ambulatory Visit
Admission: RE | Admit: 2015-07-22 | Discharge: 2015-07-22 | Disposition: A | Discharge status: Home / Self Care | Payer: Medicare Other | Source: Ambulatory Visit | Attending: Unknown Physician Specialty | Admitting: Unknown Physician Specialty

## 2015-07-22 DIAGNOSIS — D123 Benign neoplasm of transverse colon: Secondary | ICD-10-CM | POA: Diagnosis not present

## 2015-07-22 DIAGNOSIS — Z86010 Personal history of colonic polyps: Secondary | ICD-10-CM | POA: Insufficient documentation

## 2015-07-22 DIAGNOSIS — I4891 Unspecified atrial fibrillation: Secondary | ICD-10-CM | POA: Diagnosis not present

## 2015-07-22 DIAGNOSIS — Z79899 Other long term (current) drug therapy: Secondary | ICD-10-CM | POA: Diagnosis not present

## 2015-07-22 DIAGNOSIS — D120 Benign neoplasm of cecum: Secondary | ICD-10-CM | POA: Diagnosis not present

## 2015-07-22 DIAGNOSIS — Z1211 Encounter for screening for malignant neoplasm of colon: Secondary | ICD-10-CM | POA: Diagnosis not present

## 2015-07-22 DIAGNOSIS — Z7982 Long term (current) use of aspirin: Secondary | ICD-10-CM | POA: Diagnosis not present

## 2015-07-22 DIAGNOSIS — Z7951 Long term (current) use of inhaled steroids: Secondary | ICD-10-CM | POA: Diagnosis not present

## 2015-07-22 DIAGNOSIS — Z87891 Personal history of nicotine dependence: Secondary | ICD-10-CM | POA: Insufficient documentation

## 2015-07-22 DIAGNOSIS — D122 Benign neoplasm of ascending colon: Secondary | ICD-10-CM | POA: Insufficient documentation

## 2015-07-22 DIAGNOSIS — K219 Gastro-esophageal reflux disease without esophagitis: Secondary | ICD-10-CM | POA: Insufficient documentation

## 2015-07-22 DIAGNOSIS — K640 First degree hemorrhoids: Secondary | ICD-10-CM | POA: Insufficient documentation

## 2015-07-22 DIAGNOSIS — Z86711 Personal history of pulmonary embolism: Secondary | ICD-10-CM | POA: Insufficient documentation

## 2015-07-22 DIAGNOSIS — I10 Essential (primary) hypertension: Secondary | ICD-10-CM | POA: Diagnosis not present

## 2015-07-22 HISTORY — PX: COLONOSCOPY WITH PROPOFOL: SHX5780

## 2015-07-22 SURGERY — COLONOSCOPY WITH PROPOFOL
Anesthesia: General

## 2015-07-22 MED ORDER — SODIUM CHLORIDE 0.9 % IV SOLN: INTRAVENOUS | Status: DC

## 2015-07-22 MED ORDER — LACTATED RINGERS IV SOLN
INTRAVENOUS | Status: DC | PRN
  Administered 2015-07-22: 14:00:00 via INTRAVENOUS

## 2015-07-22 MED ORDER — PROPOFOL 10 MG/ML IV BOLUS
INTRAVENOUS | Status: DC | PRN
  Administered 2015-07-22: 50 mg via INTRAVENOUS

## 2015-07-22 MED ORDER — FLEET ENEMA 7-19 GM/118ML RE ENEM
1.0000 | ENEMA | Freq: Once | RECTAL | Status: AC
  Administered 2015-07-22: 1 via RECTAL

## 2015-07-22 MED ORDER — PROPOFOL 500 MG/50ML IV EMUL
INTRAVENOUS | Status: DC | PRN
  Administered 2015-07-22: 100 ug/kg/min via INTRAVENOUS

## 2015-07-22 MED ORDER — SODIUM CHLORIDE 0.9 % IV SOLN
INTRAVENOUS | Status: DC
  Administered 2015-07-22: 1000 mL via INTRAVENOUS

## 2015-07-22 MED ORDER — PIPERACILLIN-TAZOBACTAM 3.375 G IVPB
3.3750 g | Freq: Once | INTRAVENOUS | Status: AC
  Administered 2015-07-22: 3.375 g via INTRAVENOUS
  Filled 2015-07-22: qty 50

## 2015-07-22 MED ORDER — PHENYLEPHRINE HCL 10 MG/ML IJ SOLN
INTRAMUSCULAR | Status: DC | PRN
  Administered 2015-07-22: 100 ug via INTRAVENOUS
  Administered 2015-07-22: 50 ug via INTRAVENOUS
  Administered 2015-07-22: 100 ug via INTRAVENOUS

## 2015-07-22 NOTE — H&P (Signed)
Primary Care Physician:  Marinda Elk, MD Primary Gastroenterologist:  Dr. Vira Agar  Pre-Procedure History & Physical: HPI:  Kimberly Meyer is a 70 y.o. female is here for an colonoscopy.   Past Medical History  Diagnosis Date  . Kidney stone   . Hyperlipemia   . GERD (gastroesophageal reflux disease)   . IBS (irritable bowel syndrome)   . Low back pain   . History of pulmonary embolus (PE)   . Hypertension   . Situational stress   . Chronic tension headache   . Anxiety   . Anemia   . 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 Lithotripsy    . Bladder surgery      bladder tack  . Hip surgery    . Appendectomy    . Colonoscopy    . Tubal ligation    . Placement of breast implants    . Cholecystectomy    . 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 Cystoscopy/ureteroscopy/holmium laser/stent placement Left 11/13/2014    Procedure: CYSTOSCOPY/URETEROSCOPY/HOLMIUM LASER/STENT PLACEMENT;  Surgeon: Hollice Espy, MD;  Location: [facility] ORS;  Service: Urology;  Laterality: Left;  Marland Kitchen Ventral hernia repair    . Kyphoplasty N/A 06/04/2015    Procedure: KYPHOPLASTY T11, T12;  Surgeon: Hessie Knows, MD;  Location: [facility] ORS;  Service: Orthopedics;  Laterality: N/A;    Prior to Admission medications   Medication Sig Start Date End Date Taking? Authorizing Provider  aspirin 81 MG tablet Take 81 mg by mouth daily.   Yes Historical Provider, MD  buPROPion (WELLBUTRIN SR) 150 MG 12 hr tablet Take by mouth. 09/27/14  Yes Historical Provider, MD  esomeprazole (NEXIUM) 20 MG capsule Take 20 mg by mouth daily at 12 noon.   Yes Historical Provider, MD  fexofenadine (ALLEGRA) 180 MG tablet Take by mouth. 09/27/14  Yes Historical Provider, MD  fluticasone (FLONASE) 50 MCG/ACT nasal spray Place into the nose. 09/27/14  Yes Historical Provider, MD  lisinopril (PRINIVIL,ZESTRIL) 10 MG tablet Take by mouth. 09/27/14  Yes Historical  Provider, MD  Multiple Vitamin (MULTI-VITAMINS) TABS Take by mouth.   Yes Historical Provider, MD  Omega 3 1000 MG CAPS Take by mouth.   Yes Historical Provider, MD  Pancrelipase, Lip-Prot-Amyl, (ZENPEP) 20000 UNITS CPEP TAKE AS DIRECTED BEFORE MEALS AND SNACKS 09/27/14  Yes Historical Provider, MD  Potassium Citrate 15 MEQ (1620 MG) TBCR Take 1 tablet by mouth 2 (two) times daily. 06/07/15  Yes Hollice Espy, MD  Psyllium (METAMUCIL SMOOTH TEXTURE) 28.3 % POWD Take by mouth. 3 TSP IN  8 OZ WATER NIGHTLY   Yes Historical Provider, MD  traMADol (ULTRAM) 50 MG tablet Take by mouth every 6 (six) hours as needed for moderate pain.   Yes Historical Provider, MD    Allergies as of 07/08/2015 - Review Complete 06/08/2015  Allergen Reaction Noted  . Sulfa antibiotics Other (See Comments) 11/07/2014    Family History  Problem Relation Age of Onset  . Alzheimer's disease Father   . Kidney Stones Father   . Stroke Mother   . Diabetes type II Mother     Social History   Social History  . Marital Status: Married    Spouse Name: N/A  . Number of Children: N/A  . Years of Education: N/A   Occupational History  . Not on file.   Social History Main Topics  . Smoking status: Former Research scientist (life sciences)  .  Smokeless tobacco: Never Used  . Alcohol Use: No  . Drug Use: No  . Sexual Activity: Not on file   Other Topics Concern  . Not on file   Social History Narrative    Review of Systems: See HPI, otherwise negative ROS  Physical Exam: BP 121/75 mmHg  Pulse 76  Temp(Src) 97.3 F (36.3 C) (Tympanic)  Resp 18  Ht 5\' 1"  (1.549 m)  Wt 48.081 kg (106 lb)  BMI 20.04 kg/m2  SpO2 97% General:   Alert,  pleasant and cooperative in NAD Head:  Normocephalic and atraumatic. Neck:  Supple; no masses or thyromegaly. Lungs:  Clear throughout to auscultation.    Heart:  Regular rate and rhythm. Abdomen:  Soft, nontender and nondistended. Normal bowel sounds, without guarding, and without rebound.    Neurologic:  Alert and  oriented x4;  grossly normal neurologically.  Impression/Plan: Kimberly Meyer is here for an colonoscopy to be performed for [facility] colon polyps  Risks, benefits, limitations, and alternatives regarding  colonoscopy have been reviewed with the patient.  Questions have been answered.  All parties agreeable.   Gaylyn Cheers, MD  07/22/2015, 2:04 PM

## 2015-07-22 NOTE — Transfer of Care (Signed)
Immediate Anesthesia Transfer of Care Note  Patient: Kimberly Meyer  Procedure(s) Performed: Procedure(s): COLONOSCOPY WITH PROPOFOL (N/A)  Patient Location: PACU  Anesthesia Type:General  Level of Consciousness: sedated  Airway & Oxygen Therapy: Patient Spontanous Breathing  Post-op Assessment: Report given to RN and Post -op Vital signs reviewed and stable  Post vital signs: Reviewed and stable  Last Vitals:  Filed Vitals:   07/22/15 1340 07/22/15 1500  BP: 121/75 87/48  Pulse: 76 70  Temp: 36.3 C 36 C  Resp: 18 15    Complications: No apparent anesthesia complications

## 2015-07-22 NOTE — Op Note (Signed)
[facility] Patient Name: Kimberly Meyer Procedure Date: 07/22/2015 2:17 PM MRN: [id number] Account #: [id number] Date of Birth: 02/02/1946 Admit Type: Outpatient Age: 70 Room: [facility] ENDO ROOM 1 Gender: Female Note Status: Finalized Procedure:            Colonoscopy Indications:          High risk colon cancer surveillance: Personal history                        of colonic polyps Providers:            Manya Silvas, MD Referring MD:         Precious Bard, MD (Referring MD) Medicines:            Propofol per Anesthesia Complications:        No immediate complications. Procedure:            Pre-Anesthesia Assessment:                       - After reviewing the risks and benefits, the patient                        was deemed in satisfactory condition to undergo the                        procedure.                       After obtaining informed consent, the colonoscope was                        passed under direct vision. Throughout the procedure,                        the patient's blood pressure, pulse, and oxygen                        saturations were monitored continuously. The                        Colonoscope was introduced through the anus and                        advanced to the the cecum, identified by appendiceal                        orifice and ileocecal valve. The colonoscopy was                        somewhat difficult due to unsatisfactory bowel prep.                        The patient tolerated the procedure well. Findings:      A small polyp was found in the ascending colon. The polyp was sessile.       The polyp was removed with a cold snare. Resection and retrieval were       complete.      A diminutive polyp was found in the cecum. The polyp was sessile. The       polyp was removed with a jumbo cold forceps. Resection and retrieval  were complete.      A diminutive polyp was found in the transverse colon.  The polyp was       sessile. The polyp was removed with a cold snare. Resection and       retrieval were complete.      Internal hemorrhoids were found during endoscopy. The hemorrhoids were       small and Grade I (internal hemorrhoids that do not prolapse). Impression:           - One small polyp in the ascending colon, removed with                        a cold snare. Resected and retrieved.                       - One diminutive polyp in the cecum, removed with a                        jumbo cold forceps. Resected and retrieved.                       - One diminutive polyp in the transverse colon, removed                        with a cold snare. Resected and retrieved.                       - Internal hemorrhoids. Recommendation:       - Await pathology results. Manya Silvas, MD 07/22/2015 2:58:50 PM This report has been signed electronically. Number of Addenda: 0 Note Initiated On: 07/22/2015 2:17 PM Scope Withdrawal Time: 0 hours 21 minutes 9 seconds  Total Procedure Duration: 0 hours 33 minutes 32 seconds       [facility]

## 2015-07-22 NOTE — Anesthesia Preprocedure Evaluation (Signed)
Anesthesia Evaluation  Patient identified by MRN, date of birth, ID band Patient awake    Reviewed: Allergy & Precautions, H&P , NPO status , Patient's Chart, lab work & pertinent test results, reviewed documented beta blocker date and time   Airway Mallampati: II  TM Distance: >3 FB Neck ROM: limited    Dental no notable dental hx. (+) Teeth Intact   Pulmonary former smoker, PE   Pulmonary exam normal breath sounds clear to auscultation       Cardiovascular hypertension, Pt. on medications Normal cardiovascular exam+ dysrhythmias Atrial Fibrillation  Rhythm:regular Rate:Normal     Neuro/Psych  Headaches, PSYCHIATRIC DISORDERS Anxiety    GI/Hepatic Neg liver ROS, GERD  Medicated and Controlled,CA of ampulla of vater... IBS   Endo/Other  negative endocrine ROS  Renal/GU Renal diseaseKidney stones  negative genitourinary   Musculoskeletal negative musculoskeletal ROS (+)   Abdominal   Peds negative pediatric ROS (+)  Hematology negative hematology ROS (+) anemia ,   Anesthesia Other Findings Past Medical History:   Kidney stone                                                 Hyperlipemia                                                 GERD (gastroesophageal reflux disease)                       Chronic neck pain                                            IBS (irritable bowel syndrome)                               Ampullary carcinoma                                          Low back pain                                                History of pulmonary embolus (PE)                            Hypertension                                                 Atrial fibrillation  Situational stress                                           Chronic tension headache                                     Anxiety                                                      Anemia                                                        Reproductive/Obstetrics negative OB ROS                             Anesthesia Physical  Anesthesia Plan  ASA: III  Anesthesia Plan: General   Post-op Pain Management:    Induction: Intravenous  Airway Management Planned: Nasal Cannula  Additional Equipment:   Intra-op Plan:   Post-operative Plan:   Informed Consent: I have reviewed the patients History and Physical, chart, labs and discussed the procedure including the risks, benefits and alternatives for the proposed anesthesia with the patient or authorized representative who has indicated his/her understanding and acceptance.   Dental advisory given  Plan Discussed with: CRNA and Surgeon  Anesthesia Plan Comments:        Anesthesia Quick Evaluation

## 2015-07-22 NOTE — Anesthesia Postprocedure Evaluation (Signed)
Anesthesia Post Note  Patient: Kimberly Meyer  Procedure(s) Performed: Procedure(s) (LRB): COLONOSCOPY WITH PROPOFOL (N/A)  Patient location during evaluation: PACU Anesthesia Type: MAC Level of consciousness: sedated Pain management: pain level controlled Vital Signs Assessment: post-procedure vital signs reviewed and stable Respiratory status: spontaneous breathing Cardiovascular status: blood pressure returned to baseline Postop Assessment: no headache Anesthetic complications: no    Last Vitals:  Filed Vitals:   07/22/15 1340 07/22/15 1500  BP: 121/75 87/48  Pulse: 76 70  Temp: 36.3 C 36 C  Resp: 18 15    Last Pain: There were no vitals filed for this visit.               Buckner Malta

## 2015-07-23 ENCOUNTER — Encounter: Payer: Self-pay | Admitting: Unknown Physician Specialty

## 2015-07-24 LAB — SURGICAL PATHOLOGY

## 2015-08-07 ENCOUNTER — Encounter: Payer: Medicare Other | Admitting: Urology

## 2015-08-08 NOTE — Progress Notes (Signed)
This encounter was created in error - please disregard.

## 2015-08-14 ENCOUNTER — Encounter
Admission: RE | Admit: 2015-08-14 | Discharge: 2015-08-14 | Disposition: A | Discharge status: Home / Self Care | Payer: Medicare Other | Source: Ambulatory Visit | Attending: Urology | Admitting: Urology

## 2015-08-14 DIAGNOSIS — N200 Calculus of kidney: Secondary | ICD-10-CM | POA: Diagnosis present

## 2015-08-14 MED ORDER — TECHNETIUM TC 99M MERTIATIDE
5.2300 | Freq: Once | INTRAVENOUS | Status: AC | PRN
  Administered 2015-08-14: 5.23 via INTRAVENOUS

## 2015-08-14 MED ORDER — FUROSEMIDE 10 MG/ML IJ SOLN
20.0000 mg | Freq: Once | INTRAMUSCULAR | Status: AC
  Administered 2015-08-14: 20 mg via INTRAVENOUS
  Filled 2015-08-14 (×2): qty 2

## 2015-08-19 ENCOUNTER — Other Ambulatory Visit: Payer: Self-pay | Admitting: Physician Assistant

## 2015-08-19 DIAGNOSIS — R748 Abnormal levels of other serum enzymes: Secondary | ICD-10-CM

## 2015-08-19 DIAGNOSIS — K760 Fatty (change of) liver, not elsewhere classified: Secondary | ICD-10-CM

## 2015-08-23 ENCOUNTER — Ambulatory Visit: Payer: Medicare Other | Admitting: Urology

## 2015-08-24 ENCOUNTER — Other Ambulatory Visit: Payer: Self-pay | Admitting: Physician Assistant

## 2015-08-24 DIAGNOSIS — K760 Fatty (change of) liver, not elsewhere classified: Secondary | ICD-10-CM

## 2015-08-27 ENCOUNTER — Ambulatory Visit: Admission: RE | Admit: 2015-08-27 | Payer: Medicare Other | Source: Ambulatory Visit

## 2015-08-27 ENCOUNTER — Other Ambulatory Visit: Payer: Medicare Other

## 2015-08-27 ENCOUNTER — Ambulatory Visit: Payer: Medicare Other

## 2015-08-30 ENCOUNTER — Ambulatory Visit (INDEPENDENT_AMBULATORY_CARE_PROVIDER_SITE_OTHER): Payer: Medicare Other | Admitting: Urology

## 2015-08-30 ENCOUNTER — Encounter: Payer: Self-pay | Admitting: Urology

## 2015-08-30 VITALS — BP 112/68 | HR 92 | Ht 60.0 in | Wt 110.0 lb

## 2015-08-30 DIAGNOSIS — Z87442 Personal history of urinary calculi: Secondary | ICD-10-CM

## 2015-08-30 DIAGNOSIS — N261 Atrophy of kidney (terminal): Secondary | ICD-10-CM | POA: Diagnosis not present

## 2015-08-30 NOTE — Progress Notes (Signed)
9:01 AM   08/30/2015   Kimberly Meyer November 07, 1945 [id number]  Referring provider: Marinda Elk, MD [facility] [place], [place] [address]  Chief Complaint  Patient presents with  . Results    lasix renal scan     HPI: 70 yo M who returns today for f/u stones, left renal atrophy following Lasix renogram   Nephrolithiasis Pre-2016  Multiple recurrent stones.  s/p ESWL fall 2015.  24 urine 2015 which showed very low urinary volume, 1.1 L, started on potassium citrate.   10/2014 Incidental 1.3 cm left distal ureteral stone mod/ severe hydro on CT at [facility]..  S/p L URS, LL, stent on 10/2014.  Follow up RUS with L renal atrophy, significant improvement of L hydro (mild).   CT abd with contrast for work of abd pain on 05/03/15 revealed chronic left renal atrophy which has progressed "mildy" since 2011 with chronic dilation of the left proximal/ mid ureter without obstructing stone.    Lasix renogram shows left kidney with 33% function, T1/2 15 minutes. T1/2 half her right kidney is 9 minutes.  There is also notable decreased perfusion in critical take of the smaller left kidney along with calcification within the left renal artery on previous CT scan. She does not take any blood pressure medicines, BP normal.  Cr 0.8 on 07/2015.     No episodes of flank pain, gross hematuria, or any other significant urinary symptoms.      PMH: Past Medical History  Diagnosis Date  . Kidney stone   . Hyperlipemia   . GERD (gastroesophageal reflux disease)   . IBS (irritable bowel syndrome)   . Low back pain   . History of pulmonary embolus (PE)   . Hypertension   . Situational stress   . Chronic tension headache   . Anxiety   . Anemia   . 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 Lithotripsy    . Bladder surgery      bladder tack  . Hip surgery    . Appendectomy    . Colonoscopy    . Tubal  ligation    . Placement of breast implants    . Cholecystectomy    . 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 Cystoscopy/ureteroscopy/holmium laser/stent placement Left 11/13/2014    Procedure: CYSTOSCOPY/URETEROSCOPY/HOLMIUM LASER/STENT PLACEMENT;  Surgeon: Hollice Espy, MD;  Location: [facility] ORS;  Service: Urology;  Laterality: Left;  Marland Kitchen Ventral hernia repair    . Kyphoplasty N/A 06/04/2015    Procedure: KYPHOPLASTY T11, T12;  Surgeon: Hessie Knows, MD;  Location: [facility] ORS;  Service: Orthopedics;  Laterality: N/A;  . Colonoscopy with propofol N/A 07/22/2015    Procedure: COLONOSCOPY WITH PROPOFOL;  Surgeon: Manya Silvas, MD;  Location: [facility] ENDOSCOPY;  Service: Endoscopy;  Laterality: N/A;    Home Medications:    Medication List       This list is accurate as of: 08/30/15  9:01 AM.  Always use your most recent med list.               aspirin 81 MG tablet  Take 81 mg by mouth daily.     buPROPion 150 MG 12 hr tablet  Commonly known as:  WELLBUTRIN SR  Take by mouth.     esomeprazole 20 MG capsule  Commonly known as:  NEXIUM  Take 20 mg by mouth daily  at 12 noon.     fluticasone 50 MCG/ACT nasal spray  Commonly known as:  FLONASE  Place into the nose.     levocetirizine 5 MG tablet  Commonly known as:  XYZAL  Take by mouth.     lisinopril 10 MG tablet  Commonly known as:  PRINIVIL,ZESTRIL  Take by mouth.     METAMUCIL SMOOTH TEXTURE 28.3 % Powd  Generic drug:  Psyllium  Take by mouth. 3 TSP IN  8 OZ WATER NIGHTLY     MULTI-VITAMINS Tabs  Take by mouth.     niacin 500 MG tablet  Take by mouth.     Omega 3 1000 MG Caps  Take by mouth.     Potassium Citrate 15 MEQ (1620 MG) Tbcr  Take 1 tablet by mouth 2 (two) times daily.     ZENPEP 20000 units Cpep  Generic drug:  Pancrelipase (Lip-Prot-Amyl)  TAKE AS DIRECTED BEFORE MEALS AND SNACKS        Allergies:  Allergies  Allergen Reactions  . Sulfa Antibiotics Other (See Comments)      Does not remember reaction-from childhood    Family History: Family History  Problem Relation Age of Onset  . Alzheimer's disease Father   . Kidney Stones Father   . Stroke Mother   . Diabetes type II Mother     Social History:  reports that she has quit smoking. She has never used smokeless tobacco. She reports that she does not drink alcohol or use illicit drugs.   Review of Systems UROLOGY Frequent Urination?: No Hard to postpone urination?: No Burning/pain with urination?: No Get up at night to urinate?: No Leakage of urine?: Yes Urine stream starts and stops?: No Trouble starting stream?: No Do you have to strain to urinate?: No Blood in urine?: No Urinary tract infection?: No Sexually transmitted disease?: No Injury to kidneys or bladder?: No Painful intercourse?: No Weak stream?: No Currently pregnant?: No Vaginal bleeding?: No Last menstrual period?: n Gastrointestinal Nausea?: No Vomiting?: No Indigestion/heartburn?: No Diarrhea?: No Constipation?: No Constitutional Fever: No Night sweats?: No Weight loss?: No Fatigue?: No Skin Skin rash/lesions?: No Itching?: No Eyes Blurred vision?: No Double vision?: No Ears/Nose/Throat Sore throat?: No Sinus problems?: No Hematologic/Lymphatic Swollen glands?: No Easy bruising?: No Cardiovascular Leg swelling?: No Chest pain?: No Respiratory Cough?: Yes Shortness of breath?: No Endocrine Excessive thirst?: No Musculoskeletal Back pain?: No Joint pain?: No Neurological Headaches?: No Dizziness?: No Psychologic Depression?: No Anxiety?: No   Physical Exam: BP 112/68 mmHg  Pulse 92  Ht 5' (1.524 m)  Wt 110 lb (49.896 kg)  BMI 21.48 kg/m2  Constitutional:  Alert and oriented, No acute distress. HEENT: [place] AT, moist mucus membranes.  Trachea midline, no masses. Cardiovascular: No clubbing, cyanosis, or edema. Respiratory: Normal respiratory effort, no increased work of breathing. GI:  Abdomen is soft, nontender, nondistended, no abdominal masses GU: No CVA tenderness.  Skin: No rashes, bruises or suspicious lesions. Neurologic: Grossly intact, no focal deficits, moving all 4 extremities. Psychiatric: Normal mood and affect.  Laboratory Data: Basic Metabolic Panel (BMP) (36/64/4034 8:25 AM) Basic Metabolic Panel (BMP) (74/25/9563 8:25 AM)  Component Value Ref Range  Glucose 105 70 - 110 mg/dL  Sodium 138 136 - 145 mmol/L  Potassium 4.1 3.6 - 5.1 mmol/L  Chloride 100 97 - 109 mmol/L  Carbon Dioxide (CO2) 30.8 22.0 - 32.0 mmol/L  Calcium 9.4 8.7 - 10.3 mg/dL  Urea Nitrogen (BUN) 19 7 - 25 mg/dL  Creatinine  1.0 0.6 - 1.1 mg/dL  Glomerular Filtration Rate (eGFR), MDRD Estimate 55 (L) >60 mL/min/1.73sq m  BUN/Crea Ratio 19.0 6.0 - 20.0  Anion Gap w/K 11.3 6.0 - 16.0    Pertinent Imaging: Study Result     CLINICAL DATA: Left renal atrophy. Assess function and evidence for obstruction.  EXAM: NUCLEAR MEDICINE RENAL SCAN WITH DIURETIC ADMINISTRATION  TECHNIQUE: Radionuclide angiographic and sequential renal images were obtained after intravenous injection of radiopharmaceutical. Imaging was continued during slow intravenous injection of Lasix approximately 15 minutes after the start of the examination.  RADIOPHARMACEUTICALS: 5.23 mCi Technetium-46mMAG3 IV  COMPARISON: None.  FINDINGS: Flow: Delayed and diminished perfusion to the left kidney.  Left renogram: Diminished cortical uptake by the smaller left kidney. Prompt excretion of the radiopharmaceutical is identified. There is delayed and slightly diminished clearance of the radiopharmaceutical.  Right renogram: Normal cortical uptake, excretion and clearance of the radiopharmaceutical.  Differential:  Left kidney = 33.6 %  Right kidney = 66.4 %  T1/2 post Lasix :  Left kidney = 15 min  Right kidney = 9 min  IMPRESSION: 1. There is decreased perfusion and cortical  uptake by the smaller left kidney. Mildly delayed and diminished clearance of the radiopharmaceutical by the left kidney may represent a low grade partial obstruction. 2. Normally functioning right kidney. 3. Split renal function is equal to 33.6% from the left kidney and 66.4% from the right kidney. 4. Review of the CT from 05/03/2015 shows calcification at the origin of the left renal artery, (image number 7 of series 7 and image 79 of series 5 from the previous exam). Correlate for any clinical signs or symptoms of renal artery stenosis to the left kidney. Normal appearing right renal artery.   Electronically Signed  By: TKerby MoorsM.D.  On: 08/14/2015 11:33     Assessment & Plan:  89y o F with recurrnet nephrolithasis.  1. Recurrent nephrolithiasis Continue KCitrate Encouraged hydration -RUS (future)  2. Hydronephrosis, left Lasix renogram reviewed, equivocal for obstruction Appears to be chronic with dilation of proximal left ureter at least since 2011 without residual obstructing stone Mild progression of left renal atrophy over time- 33% fxn Cr normal We discussed options including continued observation/conservative management, no clear ongoing obstruction but left kidney clearly does not drain as efficiently as the right.  Other options including chronic indwelling ureteral stent were also discussed which she declined. Renal artery stenosis fairly unlikely given the lack of hypertension.  Plan for follow-up in 1 year with renal ultrasound. May repeat Lasix renogram at some point to assess for possible further renal decompensation.  3. Left renal atrophy As above   Return in about 1 year (around 08/29/2016) for RUS.  AHollice Espy MD  [facility] Street[facility] [address] [place]itronelleBNorth [place] [phone]

## 2015-09-02 ENCOUNTER — Ambulatory Visit
Admission: RE | Admit: 2015-09-02 | Discharge: 2015-09-02 | Disposition: A | Discharge status: Home / Self Care | Payer: Medicare Other | Source: Ambulatory Visit | Attending: Physician Assistant | Admitting: Physician Assistant

## 2015-09-02 DIAGNOSIS — R748 Abnormal levels of other serum enzymes: Secondary | ICD-10-CM | POA: Diagnosis present

## 2015-09-02 DIAGNOSIS — K760 Fatty (change of) liver, not elsewhere classified: Secondary | ICD-10-CM | POA: Diagnosis not present

## 2015-09-02 DIAGNOSIS — Z9889 Other specified postprocedural states: Secondary | ICD-10-CM | POA: Diagnosis not present

## 2015-09-02 DIAGNOSIS — Z9049 Acquired absence of other specified parts of digestive tract: Secondary | ICD-10-CM | POA: Insufficient documentation

## 2016-05-19 ENCOUNTER — Other Ambulatory Visit: Payer: Self-pay | Admitting: Physician Assistant

## 2016-05-19 DIAGNOSIS — Z1231 Encounter for screening mammogram for malignant neoplasm of breast: Secondary | ICD-10-CM

## 2016-06-17 ENCOUNTER — Ambulatory Visit: Payer: Medicare Other

## 2016-06-18 ENCOUNTER — Ambulatory Visit
Admission: RE | Admit: 2016-06-18 | Discharge: 2016-06-18 | Disposition: A | Discharge status: Home / Self Care | Payer: Medicare Other | Source: Ambulatory Visit | Attending: Physician Assistant | Admitting: Physician Assistant

## 2016-06-18 ENCOUNTER — Other Ambulatory Visit: Payer: Self-pay

## 2016-06-18 DIAGNOSIS — N200 Calculus of kidney: Secondary | ICD-10-CM

## 2016-06-18 DIAGNOSIS — Z1231 Encounter for screening mammogram for malignant neoplasm of breast: Secondary | ICD-10-CM

## 2016-06-18 MED ORDER — POTASSIUM CITRATE ER 15 MEQ (1620 MG) PO TBCR: 1.0000 | EXTENDED_RELEASE_TABLET | Freq: Two times a day (BID) | ORAL | 3 refills | Status: DC

## 2016-07-11 ENCOUNTER — Ambulatory Visit: Payer: Self-pay | Admitting: Plastic Surgery

## 2016-07-11 DIAGNOSIS — T8549XA Other mechanical complication of breast prosthesis and implant, initial encounter: Secondary | ICD-10-CM

## 2016-07-13 ENCOUNTER — Encounter (HOSPITAL_BASED_OUTPATIENT_CLINIC_OR_DEPARTMENT_OTHER): Payer: Self-pay | Admitting: *Deleted

## 2016-07-14 ENCOUNTER — Encounter (HOSPITAL_BASED_OUTPATIENT_CLINIC_OR_DEPARTMENT_OTHER)
Admission: RE | Admit: 2016-07-14 | Discharge: 2016-07-14 | Disposition: A | Discharge status: Home / Self Care | Payer: Medicare Other | Source: Ambulatory Visit | Attending: Plastic Surgery | Admitting: Plastic Surgery

## 2016-07-14 DIAGNOSIS — K219 Gastro-esophageal reflux disease without esophagitis: Secondary | ICD-10-CM | POA: Diagnosis not present

## 2016-07-14 DIAGNOSIS — T8542XA Displacement of breast prosthesis and implant, initial encounter: Secondary | ICD-10-CM | POA: Diagnosis present

## 2016-07-14 DIAGNOSIS — F329 Major depressive disorder, single episode, unspecified: Secondary | ICD-10-CM | POA: Diagnosis not present

## 2016-07-14 DIAGNOSIS — Z79899 Other long term (current) drug therapy: Secondary | ICD-10-CM | POA: Diagnosis not present

## 2016-07-14 DIAGNOSIS — Z86711 Personal history of pulmonary embolism: Secondary | ICD-10-CM | POA: Diagnosis not present

## 2016-07-14 DIAGNOSIS — Z87891 Personal history of nicotine dependence: Secondary | ICD-10-CM | POA: Diagnosis not present

## 2016-07-14 DIAGNOSIS — Z8507 Personal history of malignant neoplasm of pancreas: Secondary | ICD-10-CM | POA: Diagnosis not present

## 2016-07-14 DIAGNOSIS — E785 Hyperlipidemia, unspecified: Secondary | ICD-10-CM | POA: Diagnosis not present

## 2016-07-14 DIAGNOSIS — Y831 Surgical operation with implant of artificial internal device as the cause of abnormal reaction of the patient, or of later complication, without mention of misadventure at the time of the procedure: Secondary | ICD-10-CM | POA: Diagnosis not present

## 2016-07-14 DIAGNOSIS — I10 Essential (primary) hypertension: Secondary | ICD-10-CM | POA: Diagnosis not present

## 2016-07-14 DIAGNOSIS — Z7982 Long term (current) use of aspirin: Secondary | ICD-10-CM | POA: Diagnosis not present

## 2016-07-15 ENCOUNTER — Ambulatory Visit (HOSPITAL_BASED_OUTPATIENT_CLINIC_OR_DEPARTMENT_OTHER): Payer: Medicare Other | Admitting: Anesthesiology

## 2016-07-15 ENCOUNTER — Encounter (HOSPITAL_BASED_OUTPATIENT_CLINIC_OR_DEPARTMENT_OTHER)
Admission: RE | Disposition: A | Discharge status: Home / Self Care | Payer: Self-pay | Source: Ambulatory Visit | Attending: Plastic Surgery

## 2016-07-15 ENCOUNTER — Ambulatory Visit (HOSPITAL_BASED_OUTPATIENT_CLINIC_OR_DEPARTMENT_OTHER)
Admission: RE | Admit: 2016-07-15 | Discharge: 2016-07-15 | Disposition: A | Discharge status: Home / Self Care | Payer: Medicare Other | Source: Ambulatory Visit | Attending: Plastic Surgery | Admitting: Plastic Surgery

## 2016-07-15 ENCOUNTER — Encounter (HOSPITAL_BASED_OUTPATIENT_CLINIC_OR_DEPARTMENT_OTHER): Payer: Self-pay | Admitting: *Deleted

## 2016-07-15 DIAGNOSIS — K219 Gastro-esophageal reflux disease without esophagitis: Secondary | ICD-10-CM | POA: Insufficient documentation

## 2016-07-15 DIAGNOSIS — F329 Major depressive disorder, single episode, unspecified: Secondary | ICD-10-CM | POA: Insufficient documentation

## 2016-07-15 DIAGNOSIS — Z79899 Other long term (current) drug therapy: Secondary | ICD-10-CM | POA: Insufficient documentation

## 2016-07-15 DIAGNOSIS — T8542XA Displacement of breast prosthesis and implant, initial encounter: Secondary | ICD-10-CM | POA: Insufficient documentation

## 2016-07-15 DIAGNOSIS — I10 Essential (primary) hypertension: Secondary | ICD-10-CM | POA: Insufficient documentation

## 2016-07-15 DIAGNOSIS — Z87891 Personal history of nicotine dependence: Secondary | ICD-10-CM | POA: Insufficient documentation

## 2016-07-15 DIAGNOSIS — E785 Hyperlipidemia, unspecified: Secondary | ICD-10-CM | POA: Insufficient documentation

## 2016-07-15 DIAGNOSIS — T8549XA Other mechanical complication of breast prosthesis and implant, initial encounter: Secondary | ICD-10-CM

## 2016-07-15 DIAGNOSIS — Z8507 Personal history of malignant neoplasm of pancreas: Secondary | ICD-10-CM | POA: Insufficient documentation

## 2016-07-15 DIAGNOSIS — Z7982 Long term (current) use of aspirin: Secondary | ICD-10-CM | POA: Insufficient documentation

## 2016-07-15 DIAGNOSIS — Y831 Surgical operation with implant of artificial internal device as the cause of abnormal reaction of the patient, or of later complication, without mention of misadventure at the time of the procedure: Secondary | ICD-10-CM | POA: Insufficient documentation

## 2016-07-15 DIAGNOSIS — Z86711 Personal history of pulmonary embolism: Secondary | ICD-10-CM | POA: Insufficient documentation

## 2016-07-15 HISTORY — DX: Depression, unspecified: F32.A

## 2016-07-15 HISTORY — PX: BREAST IMPLANT REMOVAL: SHX5361

## 2016-07-15 HISTORY — DX: Major depressive disorder, single episode, unspecified: F32.9

## 2016-07-15 SURGERY — REMOVAL, IMPLANT, BREAST
Anesthesia: General | Site: Breast | Laterality: Left

## 2016-07-15 MED ORDER — BUPIVACAINE-EPINEPHRINE 0.5% -1:200000 IJ SOLN
INTRAMUSCULAR | Status: DC | PRN
  Administered 2016-07-15: 5 mL

## 2016-07-15 MED ORDER — HYDROMORPHONE HCL 1 MG/ML IJ SOLN
INTRAMUSCULAR | Status: AC
  Filled 2016-07-15: qty 1

## 2016-07-15 MED ORDER — SODIUM CHLORIDE 0.9 % IV SOLN: 250.0000 mL | INTRAVENOUS | Status: DC | PRN

## 2016-07-15 MED ORDER — ACETAMINOPHEN 325 MG PO TABS
650.0000 mg | ORAL_TABLET | ORAL | Status: DC | PRN
Start: 2016-07-15 — End: 2016-07-15

## 2016-07-15 MED ORDER — HYDROMORPHONE HCL 1 MG/ML IJ SOLN
0.2500 mg | INTRAMUSCULAR | Status: DC | PRN
Start: 2016-07-15 — End: 2016-07-15
  Administered 2016-07-15 (×3): 0.25 mg via INTRAVENOUS
  Administered 2016-07-15: 0.5 mg via INTRAVENOUS
  Administered 2016-07-15: 0.25 mg via INTRAVENOUS

## 2016-07-15 MED ORDER — EPHEDRINE SULFATE 50 MG/ML IJ SOLN
INTRAMUSCULAR | Status: DC | PRN
  Administered 2016-07-15: 10 mg via INTRAVENOUS

## 2016-07-15 MED ORDER — CEFAZOLIN SODIUM-DEXTROSE 2-4 GM/100ML-% IV SOLN
INTRAVENOUS | Status: AC
Start: 2016-07-15 — End: 2016-07-15
  Filled 2016-07-15: qty 100

## 2016-07-15 MED ORDER — LIDOCAINE-EPINEPHRINE 0.5 %-1:200000 IJ SOLN
INTRAMUSCULAR | Status: AC
  Filled 2016-07-15: qty 1

## 2016-07-15 MED ORDER — DEXAMETHASONE SODIUM PHOSPHATE 4 MG/ML IJ SOLN
INTRAMUSCULAR | Status: DC | PRN
  Administered 2016-07-15: 10 mg via INTRAVENOUS

## 2016-07-15 MED ORDER — HYDROCODONE-ACETAMINOPHEN 5-325 MG PO TABS: 1.0000 | ORAL_TABLET | Freq: Four times a day (QID) | ORAL | 0 refills | Status: DC | PRN

## 2016-07-15 MED ORDER — LACTATED RINGERS IV SOLN
INTRAVENOUS | Status: DC
  Administered 2016-07-15 (×2): via INTRAVENOUS

## 2016-07-15 MED ORDER — PHENYLEPHRINE HCL 10 MG/ML IJ SOLN
INTRAMUSCULAR | Status: DC | PRN
  Administered 2016-07-15 (×3): 80 ug via INTRAVENOUS

## 2016-07-15 MED ORDER — CEFAZOLIN SODIUM-DEXTROSE 2-4 GM/100ML-% IV SOLN
2.0000 g | INTRAVENOUS | Status: AC
  Administered 2016-07-15: 2 g via INTRAVENOUS

## 2016-07-15 MED ORDER — MIDAZOLAM HCL 2 MG/2ML IJ SOLN: 1.0000 mg | INTRAMUSCULAR | Status: DC | PRN

## 2016-07-15 MED ORDER — ACETAMINOPHEN 650 MG RE SUPP
650.0000 mg | RECTAL | Status: DC | PRN
Start: 2016-07-15 — End: 2016-07-15

## 2016-07-15 MED ORDER — ONDANSETRON HCL 4 MG/2ML IJ SOLN
INTRAMUSCULAR | Status: DC | PRN
  Administered 2016-07-15: 4 mg via INTRAVENOUS

## 2016-07-15 MED ORDER — PROPOFOL 10 MG/ML IV BOLUS
INTRAVENOUS | Status: DC | PRN
  Administered 2016-07-15: 150 mg via INTRAVENOUS

## 2016-07-15 MED ORDER — FENTANYL CITRATE (PF) 100 MCG/2ML IJ SOLN
INTRAMUSCULAR | Status: AC
  Filled 2016-07-15: qty 2

## 2016-07-15 MED ORDER — FENTANYL CITRATE (PF) 100 MCG/2ML IJ SOLN
50.0000 ug | INTRAMUSCULAR | Status: DC | PRN
  Administered 2016-07-15 (×2): 50 ug via INTRAVENOUS

## 2016-07-15 MED ORDER — SCOPOLAMINE 1 MG/3DAYS TD PT72: 1.0000 | MEDICATED_PATCH | Freq: Once | TRANSDERMAL | Status: DC | PRN

## 2016-07-15 MED ORDER — LIDOCAINE 2% (20 MG/ML) 5 ML SYRINGE
INTRAMUSCULAR | Status: DC | PRN
  Administered 2016-07-15: 100 mg via INTRAVENOUS

## 2016-07-15 MED ORDER — SODIUM CHLORIDE 0.9% FLUSH: 3.0000 mL | INTRAVENOUS | Status: DC | PRN

## 2016-07-15 MED ORDER — SODIUM CHLORIDE 0.9% FLUSH: 3.0000 mL | Freq: Two times a day (BID) | INTRAVENOUS | Status: DC

## 2016-07-15 SURGICAL SUPPLY — 65 items
ADH SKN CLS APL DERMABOND .7 (GAUZE/BANDAGES/DRESSINGS) ×1
BAG DECANTER FOR FLEXI CONT (MISCELLANEOUS) ×2 IMPLANT
BINDER BREAST LRG (GAUZE/BANDAGES/DRESSINGS) IMPLANT
BINDER BREAST MEDIUM (GAUZE/BANDAGES/DRESSINGS) ×1 IMPLANT
BINDER BREAST XLRG (GAUZE/BANDAGES/DRESSINGS) IMPLANT
BINDER BREAST XXLRG (GAUZE/BANDAGES/DRESSINGS) IMPLANT
BIOPATCH RED 1 DISK 7.0 (GAUZE/BANDAGES/DRESSINGS) IMPLANT
BLADE HEX COATED 2.75 (ELECTRODE) ×2 IMPLANT
BLADE SURG 15 STRL LF DISP TIS (BLADE) ×1 IMPLANT
BLADE SURG 15 STRL SS (BLADE) ×2
BNDG GAUZE ELAST 4 BULKY (GAUZE/BANDAGES/DRESSINGS) ×2 IMPLANT
CANISTER SUCT 1200ML W/VALVE (MISCELLANEOUS) ×2 IMPLANT
CHLORAPREP W/TINT 26ML (MISCELLANEOUS) ×1 IMPLANT
COVER BACK TABLE 60X90IN (DRAPES) ×2 IMPLANT
COVER MAYO STAND STRL (DRAPES) ×2 IMPLANT
DECANTER SPIKE VIAL GLASS SM (MISCELLANEOUS) IMPLANT
DERMABOND ADVANCED (GAUZE/BANDAGES/DRESSINGS) ×1
DERMABOND ADVANCED .7 DNX12 (GAUZE/BANDAGES/DRESSINGS) IMPLANT
DRAIN CHANNEL 19F RND (DRAIN) IMPLANT
DRAPE LAPAROSCOPIC ABDOMINAL (DRAPES) ×2 IMPLANT
DRSG PAD ABDOMINAL 8X10 ST (GAUZE/BANDAGES/DRESSINGS) ×2 IMPLANT
ELECT BLADE 4.0 EZ CLEAN MEGAD (MISCELLANEOUS)
ELECT BLADE 6.5 .24CM SHAFT (ELECTRODE) IMPLANT
ELECT REM PT RETURN 9FT ADLT (ELECTROSURGICAL) ×2
ELECTRODE BLDE 4.0 EZ CLN MEGD (MISCELLANEOUS) IMPLANT
ELECTRODE REM PT RTRN 9FT ADLT (ELECTROSURGICAL) ×1 IMPLANT
EVACUATOR SILICONE 100CC (DRAIN) IMPLANT
GLOVE BIO SURGEON STRL SZ 6.5 (GLOVE) ×3 IMPLANT
GLOVE BIO SURGEON STRL SZ7 (GLOVE) ×1 IMPLANT
GOWN STRL REUS W/ TWL LRG LVL3 (GOWN DISPOSABLE) ×2 IMPLANT
GOWN STRL REUS W/TWL LRG LVL3 (GOWN DISPOSABLE) ×4
IV NS 1000ML (IV SOLUTION)
IV NS 1000ML BAXH (IV SOLUTION) IMPLANT
IV NS 500ML (IV SOLUTION)
IV NS 500ML BAXH (IV SOLUTION) IMPLANT
KIT FILL SYSTEM UNIVERSAL (SET/KITS/TRAYS/PACK) IMPLANT
NDL HYPO 25X1 1.5 SAFETY (NEEDLE) IMPLANT
NDL SAFETY ECLIPSE 18X1.5 (NEEDLE) IMPLANT
NEEDLE HYPO 18GX1.5 SHARP (NEEDLE) ×2
NEEDLE HYPO 25X1 1.5 SAFETY (NEEDLE) ×2 IMPLANT
NS IRRIG 1000ML POUR BTL (IV SOLUTION) IMPLANT
PACK BASIN DAY SURGERY FS (CUSTOM PROCEDURE TRAY) ×2 IMPLANT
PENCIL BUTTON HOLSTER BLD 10FT (ELECTRODE) ×2 IMPLANT
PIN SAFETY STERILE (MISCELLANEOUS) IMPLANT
SLEEVE SCD COMPRESS KNEE MED (MISCELLANEOUS) ×2 IMPLANT
SPONGE LAP 18X18 X RAY DECT (DISPOSABLE) ×4 IMPLANT
SUT MNCRL AB 4-0 PS2 18 (SUTURE) IMPLANT
SUT MON AB 3-0 SH 27 (SUTURE) ×2
SUT MON AB 3-0 SH27 (SUTURE) ×1 IMPLANT
SUT MON AB 5-0 PS2 18 (SUTURE) ×2 IMPLANT
SUT PDS AB 2-0 CT2 27 (SUTURE) IMPLANT
SUT PROLENE 3 0 PS 2 (SUTURE) IMPLANT
SUT SILK 3 0 PS 1 (SUTURE) IMPLANT
SUT VIC AB 3-0 SH 27 (SUTURE)
SUT VIC AB 3-0 SH 27X BRD (SUTURE) IMPLANT
SUT VICRYL 4-0 PS2 18IN ABS (SUTURE) IMPLANT
SWAB COLLECTION DEVICE MRSA (MISCELLANEOUS) IMPLANT
SWAB CULTURE ESWAB REG 1ML (MISCELLANEOUS) IMPLANT
SYR 50ML LL SCALE MARK (SYRINGE) IMPLANT
SYR BULB IRRIGATION 50ML (SYRINGE) ×2 IMPLANT
SYR CONTROL 10ML LL (SYRINGE) ×1 IMPLANT
TOWEL OR 17X24 6PK STRL BLUE (TOWEL DISPOSABLE) ×4 IMPLANT
TUBE CONNECTING 20X1/4 (TUBING) ×2 IMPLANT
UNDERPAD 30X30 (UNDERPADS AND DIAPERS) ×4 IMPLANT
YANKAUER SUCT BULB TIP NO VENT (SUCTIONS) ×2 IMPLANT

## 2016-07-15 NOTE — H&P (Signed)
Kimberly Meyer is an 71 y.o. female.   Chief Complaint: exposed left breast implant HPI: The patient is a 71 yrs old wf here for removal of the exposed left breast implant.  She was being treated for a cyst in the area and then the exposed implant was identified.  She is otherwise in reasonable health.  The implants were placed many years ago.  We had a long discussion about the best course of action for safety.  She agreed for removal of the implant and waiting for the healing over the next few months.  The area is on the left breast at the inframammary fold.  Past Medical History:  Diagnosis Date  . 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 Anemia   . Anxiety   . Chronic tension headache   . Depression   . GERD (gastroesophageal reflux disease)   . History of pulmonary embolus (PE)   . Hyperlipemia   . Hypertension   . IBS (irritable bowel syndrome)   . Kidney stone   . Low back pain   . Situational stress     Past Surgical History:  Procedure Laterality Date  . APPENDECTOMY    . AUGMENTATION MAMMAPLASTY  1990's  . BLADDER SURGERY     bladder tack  . CHOLECYSTECTOMY    . COLONOSCOPY    . COLONOSCOPY WITH PROPOFOL N/A 07/22/2015   Procedure: COLONOSCOPY WITH PROPOFOL;  Surgeon: Manya Silvas, MD;  Location: [facility] ENDOSCOPY;  Service: Endoscopy;  Laterality: N/A;  . CYSTOSCOPY/URETEROSCOPY/HOLMIUM LASER/STENT PLACEMENT Left 11/13/2014   Procedure: CYSTOSCOPY/URETEROSCOPY/HOLMIUM LASER/STENT PLACEMENT;  Surgeon: Hollice Espy, MD;  Location: [facility] ORS;  Service: Urology;  Laterality: Left;  . HIP SURGERY    . KYPHOPLASTY N/A 06/04/2015   Procedure: KYPHOPLASTY T11, T12;  Surgeon: Hessie Knows, MD;  Location: [facility] ORS;  Service: Orthopedics;  Laterality: N/A;  . LITHOTRIPSY    . 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 PLACEMENT OF BREAST IMPLANTS    . TUBAL LIGATION    . VENTRAL HERNIA REPAIR    . WHIPPLE PROCEDURE      Family History  Problem Relation Age of  Onset  . Alzheimer's disease Father   . Kidney Stones Father   . Stroke Mother   . Diabetes type II Mother   . Breast cancer Neg Hx    Social History:  reports that she has quit smoking. She has never used smokeless tobacco. She reports that she does not drink alcohol or use drugs.  Allergies:  Allergies  Allergen Reactions  . Sulfa Antibiotics Other (See Comments)    Does not remember reaction-from childhood    Medications Prior to Admission  Medication Sig Dispense Refill  . aspirin 81 MG tablet Take 81 mg by mouth daily.    Marland Kitchen buPROPion (WELLBUTRIN SR) 150 MG 12 hr tablet Take by mouth.    . esomeprazole (NEXIUM) 20 MG capsule Take 20 mg by mouth daily at 12 noon.    . fluticasone (FLONASE) 50 MCG/ACT nasal spray Place into the nose.    . levocetirizine (XYZAL) 5 MG tablet Take by mouth.    Marland Kitchen lisinopril (PRINIVIL,ZESTRIL) 10 MG tablet Take by mouth.    . Multiple Vitamin (MULTI-VITAMINS) TABS Take by mouth.    . niacin 500 MG tablet Take by mouth.    . Omega 3 1000 MG CAPS Take by mouth.    . Pancrelipase, Lip-Prot-Amyl, (ZENPEP) 20000 UNITS CPEP TAKE AS DIRECTED BEFORE MEALS AND SNACKS    .  Potassium Citrate 15 MEQ (1620 MG) TBCR Take 1 tablet by mouth 2 (two) times daily. 60 tablet 3  . Psyllium (METAMUCIL SMOOTH TEXTURE) 28.3 % POWD Take by mouth. 3 TSP IN  8 OZ WATER NIGHTLY      No results found for this or any previous visit (from the past 48 hour(s)). No results found.  Review of Systems  Constitutional: Negative.   HENT: Negative.   Eyes: Negative.   Respiratory: Negative.   Cardiovascular: Negative.  Negative for chest pain.  Gastrointestinal: Negative.   Genitourinary: Negative.   Musculoskeletal: Negative.   Skin: Negative.   Neurological: Negative.   Psychiatric/Behavioral: Negative.     Blood pressure 121/66, pulse 65, temperature 97.8 F (36.6 C), temperature source Oral, resp. rate 20, height 4\' 11"  (1.499 m), weight 48.1 kg (106 lb), SpO2 100  %. Physical Exam  Constitutional: She appears well-developed and well-nourished.  HENT:  Head: Normocephalic and atraumatic.  Eyes: EOM are normal. Pupils are equal, round, and reactive to light.  Cardiovascular: Normal rate.   Respiratory: Effort normal.    GI: Soft. She exhibits no distension.  Musculoskeletal: Normal range of motion.  Neurological: She is alert.  Skin: Skin is warm.  Psychiatric: She has a normal mood and affect. Her behavior is normal. Judgment and thought content normal.     Assessment/Plan Removal of the left breast implant that is exposed.  Wallace Going, DO 07/15/2016, 10:51 AM

## 2016-07-15 NOTE — Anesthesia Procedure Notes (Signed)
Procedure Name: LMA Insertion Date/Time: 07/15/2016 11:06 AM Performed by: Maryella Shivers Pre-anesthesia Checklist: Patient identified, Emergency Drugs available, Suction available and Patient being monitored Patient Re-evaluated:Patient Re-evaluated prior to inductionOxygen Delivery Method: Circle system utilized Preoxygenation: Pre-oxygenation with 100% oxygen Intubation Type: IV induction Ventilation: Mask ventilation without difficulty LMA: LMA inserted LMA Size: 4.0 Number of attempts: 1 Airway Equipment and Method: Bite block Placement Confirmation: positive ETCO2 Tube secured with: Tape Dental Injury: Teeth and Oropharynx as per pre-operative assessment

## 2016-07-15 NOTE — Discharge Instructions (Signed)
Sink bath Drain care No heaving lifting Continue binder or sports bra  Post Anesthesia Home Care Instructions  Activity: Get plenty of rest for the remainder of the day. A responsible individual must stay with you for 24 hours following the procedure.  For the next 24 hours, DO NOT: -Drive a car -Paediatric nurse -Drink alcoholic beverages -Take any medication unless instructed by your physician -Make any legal decisions or sign important papers.  Meals: Start with liquid foods such as gelatin or soup. Progress to regular foods as tolerated. Avoid greasy, spicy, heavy foods. If nausea and/or vomiting occur, drink only clear liquids until the nausea and/or vomiting subsides. Call your physician if vomiting continues.  Special Instructions/Symptoms: Your throat may feel dry or sore from the anesthesia or the breathing tube placed in your throat during surgery. If this causes discomfort, gargle with warm salt water. The discomfort should disappear within 24 hours.  If you had a scopolamine patch placed behind your ear for the management of post- operative nausea and/or vomiting:  1. The medication in the patch is effective for 72 hours, after which it should be removed.  Wrap patch in a tissue and discard in the trash. Wash hands thoroughly with soap and water. 2. You may remove the patch earlier than 72 hours if you experience unpleasant side effects which may include dry mouth, dizziness or visual disturbances. 3. Avoid touching the patch. Wash your hands with soap and water after contact with the patch.   About my Jackson-Pratt Bulb Drain  What is a Jackson-Pratt bulb? A Jackson-Pratt is a soft, round device used to collect drainage. It is connected to a long, thin drainage catheter, which is held in place by one or two small stiches near your surgical incision site. When the bulb is squeezed, it forms a vacuum, forcing the drainage to empty into the bulb.  Emptying the  Jackson-Pratt bulb- To empty the bulb: 1. Release the plug on the top of the bulb. 2. Pour the bulb's contents into a measuring container which your nurse will provide. 3. Record the time emptied and amount of drainage. Empty the drain(s) as often as your     doctor or nurse recommends.    Squeezing the Jackson-Pratt Bulb- To squeeze the bulb: 1. Make sure the plug at the top of the bulb is open. 2. Squeeze the bulb tightly in your fist. You will hear air squeezing from the bulb. 3. Replace the plug while the bulb is squeezed. 4. Use a safety pin to attach the bulb to your clothing. This will keep the catheter from     pulling at the bulb insertion site.  When to call your doctor- Call your doctor if:  Drain site becomes red, swollen or hot.  You have a fever greater than 101 degrees F.  There is oozing at the drain site.  Drain falls out (apply a guaze bandage over the drain hole and secure it with tape).  Drainage increases daily not related to activity patterns. (You will usually have more drainage when you are active than when you are resting.)  Drainage has a bad odor.      JP Drain [employer] this sheet to all of your post-operative appointments while you have your drains.  Please measure your drains by CC's or ML's.  Make sure you drain and measure your JP Drains 2 or 3 times per day.  At the end of each day, add up totals for the left  side and add up totals for the right side.    ( 9 am )     ( 3 pm )        ( 9 pm )                Date L  R  L  R  L  R  Total L/R

## 2016-07-15 NOTE — Transfer of Care (Signed)
Immediate Anesthesia Transfer of Care Note  Patient: Kimberly Meyer  Procedure(s) Performed: Procedure(s): REMOVAL OF EXPOSED LEFT BREAST IMPLANT (Left)  Patient Location: PACU  Anesthesia Type:General  Level of Consciousness: sedated  Airway & Oxygen Therapy: Patient Spontanous Breathing and Patient connected to face mask oxygen  Post-op Assessment: Report given to RN and Post -op Vital signs reviewed and stable  Post vital signs: Reviewed and stable  Last Vitals:  Vitals:   07/15/16 1003 07/15/16 1203  BP: 121/66 98/60  Pulse: 65 73  Resp: 20   Temp: 36.6 C     Last Pain:  Vitals:   07/15/16 1003  TempSrc: Oral  PainSc: 1          Complications: No apparent anesthesia complications

## 2016-07-15 NOTE — Op Note (Signed)
DATE OF OPERATION: 07/15/2016  LOCATION: [facility]  PREOPERATIVE DIAGNOSIS: exposure of left breast implant  POSTOPERATIVE DIAGNOSIS: Same  PROCEDURE: Removal of left exposed breast implant with complete capsulectomy  SURGEON: Lyndee Leo Sanger Rhayne Chatwin, DO  ASSISTANT: Shawn Rayburn, PA  EBL: 20 cc  CONDITION: Stable  COMPLICATIONS: None  INDICATION: The patient, Kimberly Meyer, is a 72 y.o. female born on 05/12/1945, is here for treatment of an exposed left breast implant.   PROCEDURE DETAILS:  The patient was seen prior to surgery and marked.  The IV antibiotics were given. The patient was taken to the operating room and given a general anesthetic. A standard time out was performed and all information was confirmed by those in the room. SCDs were placed.  The opening of the exposed implant is at the lower middle aspect of the breast superior to the inframammary fold and scar from implantation.  The area was excised in an elliptical pattern.  Local was injected for intraoperative hemostasis.  The implant was removed.  It was a McGann 240 cc saline implant.  The capsule was murky and therefore excised completely using a bovie and scissors.  The pocket was irrigated with saline and antibiotic solution.  Hemostasis was achieved with electrocautery.  A drain was placed in the pocket and secured to the chest with a 4-0 Silk.  The pocket was closed with a 3-0 and 4-0 Monocryl.  The skin was closed with 5-0 Monocryl.  Dermabond was applied.  The breast binder was placed with ABDs. The patient was allowed to wake up and taken to recovery room in stable condition at the end of the case. The family was notified at the end of the case.

## 2016-07-15 NOTE — Anesthesia Preprocedure Evaluation (Signed)
Anesthesia Evaluation  Patient identified by MRN, date of birth, ID band Patient awake    Reviewed: Allergy & Precautions, H&P , NPO status , Patient's Chart, lab work & pertinent test results  Airway Mallampati: II  TM Distance: >3 FB Neck ROM: limited    Dental no notable dental hx. (+) Teeth Intact   Pulmonary former smoker, PE   Pulmonary exam normal breath sounds clear to auscultation       Cardiovascular hypertension, Pt. on medications Normal cardiovascular exam Rhythm:regular Rate:Normal     Neuro/Psych  Headaches, PSYCHIATRIC DISORDERS Anxiety    GI/Hepatic Neg liver ROS, GERD  Medicated and Controlled,CA of ampulla of vater... IBS   Endo/Other  negative endocrine ROS  Renal/GU Renal diseaseKidney stones  negative genitourinary   Musculoskeletal negative musculoskeletal ROS (+)   Abdominal   Peds negative pediatric ROS (+)  Hematology negative hematology ROS (+) anemia ,   Anesthesia Other Findings   Reproductive/Obstetrics negative OB ROS                            Anesthesia Physical  Anesthesia Plan  ASA: III  Anesthesia Plan: General   Post-op Pain Management:    Induction: Intravenous  Airway Management Planned: LMA  Additional Equipment:   Intra-op Plan:   Post-operative Plan: Extubation in OR  Informed Consent: I have reviewed the patients History and Physical, chart, labs and discussed the procedure including the risks, benefits and alternatives for the proposed anesthesia with the patient or authorized representative who has indicated his/her understanding and acceptance.   Dental advisory given  Plan Discussed with: CRNA and Surgeon  Anesthesia Plan Comments:         Anesthesia Quick Evaluation

## 2016-07-15 NOTE — Anesthesia Postprocedure Evaluation (Addendum)
Anesthesia Post Note  Patient: Kimberly Meyer  Procedure(s) Performed: Procedure(s) (LRB): REMOVAL OF EXPOSED LEFT BREAST IMPLANT (Left)  Patient location during evaluation: PACU Anesthesia Type: General Level of consciousness: sedated Pain management: pain level controlled Vital Signs Assessment: post-procedure vital signs reviewed and stable Respiratory status: spontaneous breathing and respiratory function stable Cardiovascular status: stable Anesthetic complications: no       Last Vitals:  Vitals:   07/15/16 1236 07/15/16 1245  BP:  119/79  Pulse: 74 77  Resp: 13 13  Temp:                  Tehillah Cipriani DANIEL

## 2016-07-16 ENCOUNTER — Encounter (HOSPITAL_BASED_OUTPATIENT_CLINIC_OR_DEPARTMENT_OTHER): Payer: Self-pay | Admitting: Plastic Surgery

## 2016-08-31 ENCOUNTER — Ambulatory Visit: Payer: Medicare Other

## 2016-08-31 ENCOUNTER — Ambulatory Visit
Admission: RE | Admit: 2016-08-31 | Discharge: 2016-08-31 | Disposition: A | Discharge status: Home / Self Care | Payer: Medicare Other | Source: Ambulatory Visit | Attending: Urology | Admitting: Urology

## 2016-08-31 DIAGNOSIS — N261 Atrophy of kidney (terminal): Secondary | ICD-10-CM | POA: Insufficient documentation

## 2016-08-31 DIAGNOSIS — R93422 Abnormal radiologic findings on diagnostic imaging of left kidney: Secondary | ICD-10-CM | POA: Insufficient documentation

## 2016-09-01 ENCOUNTER — Ambulatory Visit: Payer: Medicare Other

## 2016-09-02 ENCOUNTER — Encounter: Payer: Self-pay | Admitting: Urology

## 2016-09-02 ENCOUNTER — Ambulatory Visit (INDEPENDENT_AMBULATORY_CARE_PROVIDER_SITE_OTHER): Payer: Medicare Other | Admitting: Urology

## 2016-09-02 VITALS — BP 109/69 | HR 89 | Ht <= 58 in | Wt 106.0 lb

## 2016-09-02 DIAGNOSIS — N3000 Acute cystitis without hematuria: Secondary | ICD-10-CM

## 2016-09-02 DIAGNOSIS — N261 Atrophy of kidney (terminal): Secondary | ICD-10-CM

## 2016-09-02 DIAGNOSIS — N200 Calculus of kidney: Secondary | ICD-10-CM | POA: Diagnosis not present

## 2016-09-02 LAB — URINALYSIS, COMPLETE
Bilirubin, UA: NEGATIVE
Glucose, UA: NEGATIVE
Ketones, UA: NEGATIVE
Nitrite, UA: NEGATIVE
Specific Gravity, UA: 1.025 (ref 1.005–1.030)
Urobilinogen, Ur: 0.2 mg/dL (ref 0.2–1.0)
pH, UA: 5.5 (ref 5.0–7.5)

## 2016-09-02 LAB — MICROSCOPIC EXAMINATION: WBC, UA: >30 /hpf — ABNORMAL HIGH (ref 0–?)

## 2016-09-02 NOTE — Progress Notes (Addendum)
6:24 PM   09/02/16  Kimberly Meyer 27-Sep-1945 [id number]  Referring provider: Marinda Elk, MD [facility] [place] [place] [address]  Chief Complaint  Patient presents with  . Follow-up    1 year w/ results     HPI: 71 yo M who returns today for f/u stones, left renal atrophy who returns today for routine follow-up.   Nephrolithiasis Pre-2016  Multiple recurrent stones.  s/p ESWL fall 2015.  24 urine 2015 which showed very low urinary volume, 1.1 L, started on potassium citrate.   10/2014 Incidental 1.3 cm left distal ureteral stone mod/ severe hydro on CT at [facility]..  S/p L URS, LL, stent on 10/2014.  Follow up RUS with L renal atrophy, significant improvement of L hydro (mild).   CT abd with contrast for work of abd pain on 05/03/15 revealed chronic left renal atrophy which has progressed "mildy" since 2011 with chronic dilation of the left proximal/ mid ureter without obstructing stone.    Lasix renogram on 07/2015 shows left kidney with 33% function, T1/2 15 minutes. T1/2 half her right kidney is 9 minutes.    Follow-up renal ultrasound on 08/31/2016 shows chronic left renal cortical atrophy. Percent 5 mm hypoechoic focus in the left kidney possibly representing a stone versus vascular calcification.  Cr 0.9 on 04/2016.    No episodes of flank pain or gross hematuria.  Dysuria Over the past few days, patient has been experiencing increased urinary frequency, urgency, and dysuria. She thinks she may have a UTI.. These are infrequent. No fevers or chills.     PMH: Past Medical History:  Diagnosis Date  . 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 Anemia   . Anxiety   . Chronic tension headache   . Depression   . GERD (gastroesophageal reflux disease)   . History of pulmonary embolus (PE)   . Hyperlipemia   . Hypertension   . IBS (irritable bowel syndrome)   . Kidney stone   . Low back pain   . Situational stress     Surgical History: Past  Surgical History:  Procedure Laterality Date  . APPENDECTOMY    . AUGMENTATION MAMMAPLASTY  1990's  . BLADDER SURGERY     bladder tack  . BREAST IMPLANT REMOVAL Left 07/15/2016   Procedure: REMOVAL OF EXPOSED LEFT BREAST IMPLANT;  Surgeon: Wallace Going, DO;  Location: [facility];  Service: Plastics;  Laterality: Left;  . CHOLECYSTECTOMY    . COLONOSCOPY    . COLONOSCOPY WITH PROPOFOL N/A 07/22/2015   Procedure: COLONOSCOPY WITH PROPOFOL;  Surgeon: Manya Silvas, MD;  Location: [facility] ENDOSCOPY;  Service: Endoscopy;  Laterality: N/A;  . CYSTOSCOPY/URETEROSCOPY/HOLMIUM LASER/STENT PLACEMENT Left 11/13/2014   Procedure: CYSTOSCOPY/URETEROSCOPY/HOLMIUM LASER/STENT PLACEMENT;  Surgeon: Hollice Espy, MD;  Location: [facility] ORS;  Service: Urology;  Laterality: Left;  . HIP SURGERY    . KYPHOPLASTY N/A 06/04/2015   Procedure: KYPHOPLASTY T11, T12;  Surgeon: Hessie Knows, MD;  Location: [facility] ORS;  Service: Orthopedics;  Laterality: N/A;  . LITHOTRIPSY    . 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 PLACEMENT OF BREAST IMPLANTS    . TUBAL LIGATION    . VENTRAL HERNIA REPAIR    . WHIPPLE PROCEDURE      Home Medications:  Allergies as of 09/02/2016      Reactions   Sulfa Antibiotics Other (See Comments)   Does not remember reaction-from childhood  Medication List       Accurate as of 09/02/16 11:59 PM. Always use your most recent med list.          aspirin 81 MG tablet Take 81 mg by mouth daily.   buPROPion 150 MG 12 hr tablet Commonly known as:  WELLBUTRIN SR Take by mouth.   esomeprazole 20 MG capsule Commonly known as:  NEXIUM Take 20 mg by mouth daily at 12 noon.   fluticasone 50 MCG/ACT nasal spray Commonly known as:  FLONASE Place into the nose.   levocetirizine 5 MG tablet Commonly known as:  XYZAL Take by mouth.   lisinopril 10 MG tablet Commonly known as:  PRINIVIL,ZESTRIL Take by mouth.   METAMUCIL SMOOTH TEXTURE 28.3  % Powd Generic drug:  Psyllium Take by mouth. 3 TSP IN  8 OZ WATER NIGHTLY   MULTI-VITAMINS Tabs Take by mouth.   niacin 500 MG tablet Take by mouth.   Omega 3 1000 MG Caps Take by mouth.   Potassium Citrate 15 MEQ (1620 MG) Tbcr Take 1 tablet by mouth 2 (two) times daily.   ZENPEP 20000-68000 units Cpep Generic drug:  Pancrelipase (Lip-Prot-Amyl) TAKE AS DIRECTED BEFORE MEALS AND SNACKS       Allergies:  Allergies  Allergen Reactions  . Sulfa Antibiotics Other (See Comments)    Does not remember reaction-from childhood    Family History: Family History  Problem Relation Age of Onset  . Alzheimer's disease Father   . Kidney Stones Father   . Stroke Mother   . Diabetes type II Mother   . Breast cancer Neg Hx     Social History:  reports that she has quit smoking. She has never used smokeless tobacco. She reports that she does not drink alcohol or use drugs.   Review of Systems UROLOGY Frequent Urination?: No Hard to postpone urination?: No Burning/pain with urination?: No Get up at night to urinate?: Yes Leakage of urine?: No Urine stream starts and stops?: No Trouble starting stream?: No Do you have to strain to urinate?: No Blood in urine?: No Urinary tract infection?: No Sexually transmitted disease?: No Injury to kidneys or bladder?: No Painful intercourse?: No Weak stream?: No Currently pregnant?: No Vaginal bleeding?: No Last menstrual period?: n Gastrointestinal Nausea?: No Vomiting?: No Indigestion/heartburn?: No Diarrhea?: No Constipation?: No Constitutional Fever: No Night sweats?: No Weight loss?: No Fatigue?: No Skin Skin rash/lesions?: No Itching?: No Eyes Blurred vision?: No Double vision?: No Ears/Nose/Throat Sore throat?: No Sinus problems?: No Hematologic/Lymphatic Swollen glands?: No Easy bruising?: No Cardiovascular Leg swelling?: No Chest pain?: No Respiratory Cough?: No Shortness of breath?:  No Endocrine Excessive thirst?: No Musculoskeletal Back pain?: No Joint pain?: No Neurological Headaches?: No Dizziness?: No Psychologic Depression?: No Anxiety?: No   Physical Exam: BP 109/69   Pulse 89   Ht 4\' 10"  (1.473 m)   Wt 106 lb (48.1 kg)   BMI 22.15 kg/m   Constitutional:  Alert and oriented, No acute distress. HEENT: [place] AT, moist mucus membranes.  Trachea midline, no masses. Cardiovascular: No clubbing, cyanosis, or edema. Respiratory: Normal respiratory effort, no increased work of breathing. GI: Abdomen is soft, nontender, nondistended, no abdominal masses GU: No CVA tenderness.  Skin: No rashes, bruises or suspicious lesions. Neurologic: Grossly intact, no focal deficits, moving all 4 extremities. Psychiatric: Normal mood and affect.  Laboratory Data: Comprehensive Metabolic Panel (CMP) (16/01/9603 9:59 AM) Comprehensive Metabolic Panel (CMP) (54/12/8117 9:59 AM)  Component Value Ref Range  Glucose 105 70 -  110 mg/dL  Sodium 143 136 - 145 mmol/L  Potassium 4.2 3.6 - 5.1 mmol/L  Chloride 107 97 - 109 mmol/L  Carbon Dioxide (CO2) 31.2 22.0 - 32.0 mmol/L  Urea Nitrogen (BUN) 18 7 - 25 mg/dL  Creatinine 0.9 0.6 - 1.1 mg/dL   Results for orders placed or performed in visit on 09/02/16  Microscopic Examination  Result Value Ref Range   WBC, UA >30 (H) 0 - 5 /hpf   RBC, UA 3-10 (A) 0 - 2 /hpf   Epithelial Cells (non renal) 0-10 0 - 10 /hpf   Mucus, UA Present (A) Not Estab.   Bacteria, UA Few (A) None seen/Few  Urinalysis, Complete  Result Value Ref Range   Specific Gravity, UA 1.025 1.005 - 1.030   pH, UA 5.5 5.0 - 7.5   Color, UA Yellow Yellow   Appearance Ur Cloudy (A) Clear   Leukocytes, UA 2+ (A) Negative   Protein, UA 1+ (A) Negative/Trace   Glucose, UA Negative Negative   Ketones, UA Negative Negative   RBC, UA Trace (A) Negative   Bilirubin, UA Negative Negative   Urobilinogen, Ur 0.2 0.2 - 1.0 mg/dL   Nitrite, UA Negative Negative    Microscopic Examination See below:      Imaging: CLINICAL DATA:  Left renal atrophy, history of kidney stones  EXAM: RENAL / URINARY TRACT ULTRASOUND COMPLETE  COMPARISON:  CT scan of the abdomen and pelvis of May 03, 2015  FINDINGS: Right Kidney:  Length: 10.5 cm. The renal cortical echotexture is mildly increased and is approximately the same as that of the adjacent liver. There is no hydronephrosis.  Left Kidney:  Length: 10.1. The renal cortical echotexture is similar to that on the right but there is cortical thinning diffusely. There is no hydronephrosis. In the midpole there is an 5 mm echogenic focus without shadowing which may reflect a nonobstructing stone.  Bladder:  Appears normal for degree of bladder distention. Bilateral ureteral jets are observed.  IMPRESSION: Chronic left renal cortical atrophy. Both kidneys exhibit mildly increased renal cortical echotexture which is approximately equal to that of the liver. There is no hydronephrosis. A 5 mm echogenic focus in the midpole of the left kidney may reflect a nonshadowing nonobstructing stone.   Electronically Signed   By: David  Martinique M.D.   On: 08/31/2016 13:18  Renal ultrasound imaging was personally reviewed today.  Assessment & Plan:   1. Recurrent nephrolithiasis Continue KCitrate  Encouraged hydration Small 5 mm left mid pole calcification which may or may not represent nonobstructing stone, asymptomatic, will continue to follow Return sooner if develops flank pain -RUS (future)  2. Left renal atrophy Stable Follow with serial ultrasound/ Cr  3. Acute cystitis without hematuria + UA Given symptoms and positive UA, will treat with Keflex   Return in about 1 year (around 09/02/2017) for RUS.  Hollice Espy, MD  [facility] [address] [place], [place] [address] [phone]

## 2016-09-02 NOTE — Progress Notes (Signed)
urin

## 2016-09-03 ENCOUNTER — Telehealth: Payer: Self-pay | Admitting: Urology

## 2016-09-03 MED ORDER — CEPHALEXIN 500 MG PO CAPS: 500.0000 mg | ORAL_CAPSULE | Freq: Four times a day (QID) | ORAL | 0 refills | Status: DC

## 2016-09-03 NOTE — Telephone Encounter (Signed)
UA yesterday was suspicious for infection and she continues to have dysuria and frequency. Unfortunately, her urine was not sent for culture. We will go ahead and treat presumptively with Keflex and if her symptoms fail to improve, she'll need to come back in for routine culture. She understands this and will pickup anabiotic's from her pharmacy later today.  Hollice Espy, MD

## 2016-09-03 NOTE — Telephone Encounter (Deleted)
Spoke with the patient, discussed that the intramural stone in the left ureter is unchanged and CT scan shows no overt hydronephrosis or obstruction from this which is reassuring. As such, no intervention is needed at this time.  She also has an 8 mm right lower pole nonobstructing stone and is currently asymptomatic. As such, we will continue to follow this.  I would like to see her back in 6 months with a KUB. She is agreeable with this plan.  She was advised to return sooner if she develops flank pain, hematuria, or any other worrisome symptoms.  Hollice Espy, MD

## 2016-09-26 NOTE — Addendum Note (Signed)
Addendum  created 09/26/16 1105 by Duane Boston, MD   Sign clinical note

## 2016-10-02 ENCOUNTER — Telehealth: Payer: Self-pay

## 2016-10-02 ENCOUNTER — Ambulatory Visit (INDEPENDENT_AMBULATORY_CARE_PROVIDER_SITE_OTHER): Payer: Medicare Other

## 2016-10-02 VITALS — BP 106/71 | HR 89 | Ht <= 58 in | Wt 110.6 lb

## 2016-10-02 DIAGNOSIS — R300 Dysuria: Secondary | ICD-10-CM | POA: Diagnosis not present

## 2016-10-02 LAB — MICROSCOPIC EXAMINATION
RBC, UA: NONE SEEN /hpf (ref 0–?)
WBC, UA: >30 /hpf — ABNORMAL HIGH (ref 0–?)

## 2016-10-02 LAB — URINALYSIS, COMPLETE
Bilirubin, UA: NEGATIVE
Glucose, UA: NEGATIVE
Ketones, UA: NEGATIVE
Nitrite, UA: NEGATIVE
Protein, UA: NEGATIVE
RBC, UA: NEGATIVE
Specific Gravity, UA: 1.015 (ref 1.005–1.030)
Urobilinogen, Ur: 0.2 mg/dL (ref 0.2–1.0)
pH, UA: 5.5 (ref 5.0–7.5)

## 2016-10-02 NOTE — Progress Notes (Signed)
Pt present today with c/o burning/painful urination. Provided urine sample. Advised patient will contact after results reviewed. Pt verbalized understanding.

## 2016-10-02 NOTE — Telephone Encounter (Signed)
Pt left a vmail. Returned call. Burning w/ urination x4 days. Advised patient to come in for nurse visit, provide urine sample for routine culture per Dr. Cherrie Gauze 05/10 telephone note.  Patient agreed.

## 2016-10-04 LAB — URINE CULTURE

## 2016-10-05 ENCOUNTER — Telehealth: Payer: Self-pay

## 2016-10-05 ENCOUNTER — Other Ambulatory Visit: Payer: Self-pay | Admitting: Plastic Surgery

## 2016-10-05 MED ORDER — CIPROFLOXACIN HCL 500 MG PO TABS: 500.0000 mg | ORAL_TABLET | Freq: Two times a day (BID) | ORAL | 0 refills | Status: DC

## 2016-10-05 NOTE — Telephone Encounter (Signed)
Called patient. Gave lab results and Cipro instructions. Verified pharmacy.  Patient verbalized understanding.

## 2016-10-05 NOTE — Telephone Encounter (Signed)
-----   Message from Hollice Espy, MD sent at 10/05/2016  8:28 AM EDT ----- Please treat with cipro 500 mg bid x 7 days.

## 2016-11-07 ENCOUNTER — Other Ambulatory Visit: Payer: Self-pay | Admitting: Urology

## 2016-11-07 DIAGNOSIS — N200 Calculus of kidney: Secondary | ICD-10-CM

## 2016-11-19 DIAGNOSIS — N6489 Other specified disorders of breast: Secondary | ICD-10-CM | POA: Insufficient documentation

## 2017-02-20 ENCOUNTER — Other Ambulatory Visit: Payer: Self-pay | Admitting: Urology

## 2017-02-20 DIAGNOSIS — N200 Calculus of kidney: Secondary | ICD-10-CM

## 2017-03-03 DIAGNOSIS — M810 Age-related osteoporosis without current pathological fracture: Secondary | ICD-10-CM | POA: Insufficient documentation

## 2017-05-24 DIAGNOSIS — K760 Fatty (change of) liver, not elsewhere classified: Secondary | ICD-10-CM | POA: Insufficient documentation

## 2017-08-09 ENCOUNTER — Ambulatory Visit (INDEPENDENT_AMBULATORY_CARE_PROVIDER_SITE_OTHER): Payer: Medicare Other

## 2017-08-09 VITALS — BP 139/90 | HR 87

## 2017-08-09 DIAGNOSIS — R300 Dysuria: Secondary | ICD-10-CM | POA: Diagnosis not present

## 2017-08-09 LAB — URINALYSIS, COMPLETE
Bilirubin, UA: NEGATIVE
Glucose, UA: NEGATIVE
Nitrite, UA: NEGATIVE
RBC, UA: NEGATIVE
Specific Gravity, UA: 1.025 (ref 1.005–1.030)
Urobilinogen, Ur: 0.2 mg/dL (ref 0.2–1.0)
pH, UA: 5.5 (ref 5.0–7.5)

## 2017-08-09 LAB — MICROSCOPIC EXAMINATION: RBC, UA: NONE SEEN /hpf (ref 0–2)

## 2017-08-09 MED ORDER — NITROFURANTOIN MONOHYD MACRO 100 MG PO CAPS: 100.0000 mg | ORAL_CAPSULE | Freq: Two times a day (BID) | ORAL | 0 refills | Status: DC

## 2017-08-09 NOTE — Progress Notes (Signed)
Patient present today complaining of urinary urgency and dysuria.  Per Dr. Erlene Quan urine today looks positive for infection will start on abx and send for culture and call with results. Patient notified on vmail

## 2017-08-12 ENCOUNTER — Telehealth: Payer: Self-pay

## 2017-08-12 LAB — CULTURE, URINE COMPREHENSIVE

## 2017-08-12 NOTE — Telephone Encounter (Signed)
-----   Message from Hollice Espy, MD sent at 08/12/2017  8:01 AM EDT ----- Urine culture reviewed, antibiotics previously prescribed are appropriate.  Hollice Espy, MD

## 2017-08-12 NOTE — Telephone Encounter (Signed)
Patient notified on vmail 

## 2017-09-03 ENCOUNTER — Ambulatory Visit (INDEPENDENT_AMBULATORY_CARE_PROVIDER_SITE_OTHER): Payer: Medicare Other | Admitting: Urology

## 2017-09-03 ENCOUNTER — Other Ambulatory Visit: Payer: Self-pay | Admitting: Urology

## 2017-09-03 DIAGNOSIS — N200 Calculus of kidney: Secondary | ICD-10-CM

## 2017-09-03 DIAGNOSIS — N261 Atrophy of kidney (terminal): Secondary | ICD-10-CM

## 2017-09-03 NOTE — Progress Notes (Signed)
Patient was scheduled for routine annual follow-up today with renal ultrasound.  Unfortunately, her renal ultrasound was not yet performed.  As such, we will re-schedule her appointment following the renal ultrasound.  Hollice Espy, MD

## 2017-10-18 ENCOUNTER — Ambulatory Visit
Admission: RE | Admit: 2017-10-18 | Discharge: 2017-10-18 | Disposition: A | Discharge status: Home / Self Care | Payer: Medicare Other | Source: Ambulatory Visit | Attending: Urology | Admitting: Urology

## 2017-10-18 DIAGNOSIS — N261 Atrophy of kidney (terminal): Secondary | ICD-10-CM | POA: Diagnosis present

## 2017-10-18 DIAGNOSIS — N200 Calculus of kidney: Secondary | ICD-10-CM | POA: Diagnosis present

## 2017-10-21 ENCOUNTER — Ambulatory Visit
Admission: RE | Admit: 2017-10-21 | Discharge: 2017-10-21 | Disposition: A | Discharge status: Home / Self Care | Payer: Medicare Other | Source: Ambulatory Visit | Attending: Urology | Admitting: Urology

## 2017-10-21 ENCOUNTER — Encounter: Payer: Self-pay | Admitting: Urology

## 2017-10-21 ENCOUNTER — Ambulatory Visit (INDEPENDENT_AMBULATORY_CARE_PROVIDER_SITE_OTHER): Payer: Medicare Other | Admitting: Urology

## 2017-10-21 VITALS — BP 106/70 | HR 88 | Ht <= 58 in | Wt 107.0 lb

## 2017-10-21 DIAGNOSIS — N390 Urinary tract infection, site not specified: Secondary | ICD-10-CM

## 2017-10-21 DIAGNOSIS — N200 Calculus of kidney: Secondary | ICD-10-CM

## 2017-10-21 DIAGNOSIS — N261 Atrophy of kidney (terminal): Secondary | ICD-10-CM

## 2017-10-21 NOTE — Progress Notes (Signed)
10/21/2017 1:04 PM   Kimberly Meyer [birth year] [id number]  Referring provider: Marinda Elk, MD [place] [facility] [place], [place] [address]  Chief Complaint  Patient presents with  . Nephrolithiasis    HPI: 72 yo F who returns today for f/u stones, left renal atrophy who returns today for routine follow-up.    Nephrolithiasis Pre-2016  Multiple recurrent stones.  s/p ESWL fall 2015.  24 urine 2015 which showed very low urinary volume, 1.1 L, started on potassium citrate.   10/2014 Incidental 1.3 cm left distal ureteral stone mod/ severe hydro on CT at [facility]..  S/p L URS, LL, stent on 10/2014.  Follow up RUS with L renal atrophy, significant improvement of L hydro (mild).   CT abd with contrast for work of abd pain on 05/03/15 revealed chronic left renal atrophy which has progressed "mildy" since 2011 with chronic dilation of the left proximal/ mid ureter without obstructing stone.   Lasix renogram on 07/2015 shows left kidney with 33% function, T1/2 15 minutes. T1/2 half her right kidney is 9 minutes.    Most recent imaging in the form of follow-up renal ultrasound in 09/28/2017 shows minimal left parenchymal thinning without hydronephrosis.  There is a 9 mm left lower pole stone seen on renal ultrasound.  This appears slightly larger than last year which is 5 mm.  Cr 0.8 on 04/2017.  No episodes of flank pain or gross hematuria.  She has had no stone episodes since last visit 1 year ago.      No significant urinary symptoms today.   She did have a single UTI treated in 07/2017 which ultimately grew Enterococcus.  No other UTIs this year.  No dysuria.     PMH: Past Medical History:  Diagnosis Date  . 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 Anemia   . Anxiety   . Chronic tension headache   . Depression   . GERD (gastroesophageal reflux disease)   . History of pulmonary embolus (PE)   . Hyperlipemia   . Hypertension   . IBS (irritable bowel syndrome)   .  Kidney stone   . Low back pain   . Situational stress     Surgical History: Past Surgical History:  Procedure Laterality Date  . APPENDECTOMY    . AUGMENTATION MAMMAPLASTY  1990's  . BLADDER SURGERY     bladder tack  . BREAST IMPLANT REMOVAL Left 07/15/2016   Procedure: REMOVAL OF EXPOSED LEFT BREAST IMPLANT;  Surgeon: Wallace Going, DO;  Location: [facility];  Service: Plastics;  Laterality: Left;  . CHOLECYSTECTOMY    . COLONOSCOPY    . COLONOSCOPY WITH PROPOFOL N/A 07/22/2015   Procedure: COLONOSCOPY WITH PROPOFOL;  Surgeon: Manya Silvas, MD;  Location[facility] ENDOSCOPY;  Service: Endoscopy;  Laterality: N/A;  . CYSTOSCOPY/URETEROSCOPY/HOLMIUM LASER/STENT PLACEMENT Left 11/13/2014   Procedure: CYSTOSCOPY/URETEROSCOPY/HOLMIUM LASER/STENT PLACEMENT;  Surgeon: Hollice Espy, MD;  Location: [facility] ORS;  Service: Urology;  Laterality: Left;  . HIP SURGERY    . KYPHOPLASTY N/A 06/04/2015   Procedure: KYPHOPLASTY T11, T12;  Surgeon: Hessie Knows, MD;  Location: [facility] ORS;  Service: Orthopedics;  Laterality: N/A;  . LITHOTRIPSY    . 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 PLACEMENT OF BREAST IMPLANTS    . TUBAL LIGATION    . VENTRAL HERNIA REPAIR    . WHIPPLE PROCEDURE      Home Medications:  Allergies as of 10/21/2017  Reactions   Sulfa Antibiotics Other (See Comments)   Does not remember reaction-from childhood      Medication List        Accurate as of 10/21/17  1:04 PM. Always use your most recent med list.          aspirin 81 MG tablet Take 81 mg by mouth daily.   buPROPion 150 MG 12 hr tablet Commonly known as:  WELLBUTRIN SR Take by mouth.   cephALEXin 500 MG capsule Commonly known as:  KEFLEX Take 1 capsule (500 mg total) by mouth 4 (four) times daily.   esomeprazole 20 MG capsule Commonly known as:  NEXIUM Take 20 mg by mouth daily at 12 noon.   fluticasone 50 MCG/ACT nasal spray Commonly known as:   FLONASE Place into the nose.   levocetirizine 5 MG tablet Commonly known as:  XYZAL Take by mouth.   lisinopril 10 MG tablet Commonly known as:  PRINIVIL,ZESTRIL Take by mouth.   METAMUCIL SMOOTH TEXTURE 28.3 % Powd Generic drug:  Psyllium Take by mouth. 3 TSP IN  8 OZ WATER NIGHTLY   MULTI-VITAMINS Tabs Take by mouth.   niacin 500 MG tablet Take by mouth.   Omega 3 1000 MG Caps Take by mouth.   Potassium Citrate 15 MEQ (1620 MG) Tbcr TAKE 1 TABLET TWICE DAILY   ZENPEP 20000-68000 units Cpep Generic drug:  Pancrelipase (Lip-Prot-Amyl) TAKE AS DIRECTED BEFORE MEALS AND SNACKS       Allergies:  Allergies  Allergen Reactions  . Sulfa Antibiotics Other (See Comments)    Does not remember reaction-from childhood    Family History: Family History  Problem Relation Age of Onset  . Alzheimer's disease Father   . Kidney Stones Father   . Stroke Mother   . Diabetes type II Mother   . Breast cancer Neg Hx     Social History:  reports that she has quit smoking. She has never used smokeless tobacco. She reports that she does not drink alcohol or use drugs.  ROS: UROLOGY Frequent Urination?: No Hard to postpone urination?: No Burning/pain with urination?: No Get up at night to urinate?: No Leakage of urine?: No Urine stream starts and stops?: Yes Trouble starting stream?: No Do you have to strain to urinate?: No Blood in urine?: No Urinary tract infection?: No Sexually transmitted disease?: No Injury to kidneys or bladder?: No Painful intercourse?: No Weak stream?: No Currently pregnant?: No Vaginal bleeding?: No Last menstrual period?: n  Gastrointestinal Nausea?: No Vomiting?: No Indigestion/heartburn?: No Diarrhea?: No Constipation?: No  Constitutional Fever: No Night sweats?: No Weight loss?: No Fatigue?: No  Skin Skin rash/lesions?: No Itching?: No  Eyes Blurred vision?: No Double vision?: No  Ears/Nose/Throat Sore throat?:  No Sinus problems?: No  Hematologic/Lymphatic Swollen glands?: No Easy bruising?: No  Cardiovascular Leg swelling?: No Chest pain?: No  Respiratory Cough?: Yes Shortness of breath?: No  Endocrine Excessive thirst?: No  Musculoskeletal Back pain?: No Joint pain?: No  Neurological Headaches?: No Dizziness?: No  Psychologic Depression?: No Anxiety?: No  Physical Exam: BP 106/70   Pulse 88   Ht 4\' 10"  (1.473 m)   Wt 107 lb (48.5 kg)   BMI 22.36 kg/m   Constitutional:  Alert and oriented, No acute distress. Well dressed. HEENT:  AT, moist mucus membranes.  Trachea midline, no masses. Cardiovascular: No clubbing, cyanosis, or edema. Respiratory: Normal respiratory effort, no increased work of breathing. GI: Abdomen is soft, nontender, nondistended, no abdominal masses GU: No  CVA tenderness  Skin: No rashes, bruises or suspicious lesions. Neurologic: Grossly intact, no focal deficits, moving all 4 extremities. Psychiatric: Normal mood and affect.  Laboratory Data: Cr 0.8 on 04/2017  Urinalysis NA  Pertinent Imaging: Results for orders placed during the hospital encounter of 10/18/17  US RENAL   Narrative CLINICAL DATA:  72 year old female with left renal atrophy. Recurrent nephrolithiasis. Subsequent encounter.  EXAM: RENAL / URINARY TRACT ULTRASOUND COMPLETE  COMPARISON:  08/31/2016 ultrasound and 05/03/2015 CT.  FINDINGS: Right Kidney:  Length: 10.2 cm. Echogenicity within normal limits. No mass or hydronephrosis visualized.  Left Kidney:  Length: 9.4 cm. Minimal renal parenchymal thinning. No hydronephrosis. Nonobstructing lower pole 9 mm calculus.  Bladder:  Appears normal for degree of bladder distention. Bilateral ureteral jets noted.  IMPRESSION: Minimal left renal parenchymal thinning.  No hydronephrosis.  Nonobstructing left lower pole 9 mm calculus.   Electronically Signed   By: Genia Del M.D.   On: 10/18/2017 14:05     RUS imaging was personally reviewed today.  This was directly compared to previous RUS last years.  Assessment & Plan:    1. Left renal atrophy Overall renal function stable Parenchyma appears unchanged on RUS (about 2 cm thick, stable) Will likely continue to follow with annual RUS   - DG Abd 1 View; Future  2. Left nephrolithiasis KUB ordered today Will call with results and discuss treatment options May be interested in prophylactic ESWL if stone sizable vs observation  3. Recurrent UTI Single infection this year Currently asymptomatic Nurse visit with UTI symptoms  Hollice Espy, MD  [facility] [address] [place], [place] [address] [phone]

## 2017-10-26 ENCOUNTER — Telehealth: Payer: Self-pay | Admitting: Urology

## 2017-10-26 DIAGNOSIS — N200 Calculus of kidney: Secondary | ICD-10-CM

## 2017-10-26 NOTE — Telephone Encounter (Signed)
KUB results were reviewed with the patient today.  The overall quality of which was relatively poor secondary to bowel contents previous abdominal surgeries.  There may or may not be a greater than 1 cm nonobstructing stone on the left kidney.  This point time, she remains relatively asymptomatic.  She is mostly worried about the stone.  We discussed options including proceeding with a noncontrast CT scan versus repeating serial renal ultrasound about 6 months to reassess.  She is most interested in conservative management at this time.  We will arrange for renal ultrasound in 6 months with the appropriate follow-up.  She will return sooner as needed.  Hollice Espy, MD

## 2017-10-26 NOTE — Telephone Encounter (Signed)
Pt was calling to talk to you about her X-Ray.  [phone]

## 2017-12-09 ENCOUNTER — Other Ambulatory Visit: Payer: Self-pay | Admitting: Family Medicine

## 2017-12-09 DIAGNOSIS — N200 Calculus of kidney: Secondary | ICD-10-CM

## 2017-12-09 MED ORDER — POTASSIUM CITRATE ER 15 MEQ (1620 MG) PO TBCR: 1.0000 | EXTENDED_RELEASE_TABLET | Freq: Two times a day (BID) | ORAL | 3 refills | Status: DC

## 2018-04-28 ENCOUNTER — Ambulatory Visit
Admission: RE | Admit: 2018-04-28 | Discharge: 2018-04-28 | Disposition: A | Discharge status: Home / Self Care | Payer: Medicare Other | Source: Ambulatory Visit | Attending: Urology | Admitting: Urology

## 2018-04-28 ENCOUNTER — Other Ambulatory Visit: Payer: Self-pay | Admitting: Urology

## 2018-04-28 DIAGNOSIS — N200 Calculus of kidney: Secondary | ICD-10-CM

## 2018-04-28 MED ORDER — POTASSIUM CITRATE ER 15 MEQ (1620 MG) PO TBCR
1.0000 | EXTENDED_RELEASE_TABLET | Freq: Two times a day (BID) | ORAL | 3 refills | Status: AC
Start: 2018-04-28 — End: ?

## 2018-04-28 NOTE — Telephone Encounter (Signed)
Potassium Citrate rx sent to [facility].

## 2018-04-28 NOTE — Telephone Encounter (Signed)
Pt former pharmacy went out of business, Kimberly Meyer, new pharmacy [facility] is calling for a Rx for Potassium Citrate, as they are having trouble with Rx being transferred from [employer] to [employer]. Please advise [phone].

## 2018-04-28 NOTE — Telephone Encounter (Signed)
Received a voice mail message from [place] at [place] [phone]) regarding the patient's prescription for potassium citrate ER.  She is having trouble transferring the prescription from the patient's previous pharmacy.  She is requesting that we send over a new prescription.

## 2018-05-03 ENCOUNTER — Ambulatory Visit: Payer: Medicare Other | Admitting: Urology

## 2018-05-03 ENCOUNTER — Encounter: Payer: Self-pay | Admitting: Urology

## 2018-05-18 ENCOUNTER — Ambulatory Visit: Payer: Medicare Other | Admitting: Urology

## 2018-05-18 NOTE — Progress Notes (Incomplete)
05/18/2018 8:22 AM   Kimberly Meyer 1946-04-23 [id number]  Referring provider: Marinda Elk, [place] [facility] [place] [address]  No chief complaint on file.   HPI: Kimberly Meyer is a 73 yo F who returns today for a routine f/u for the evaluation and management of stones and left renal atrophy.    Nephrolithiasis Pre-2016 Multiple recurrent stones. s/p ESWL fall 2015. 24 urine 2015 which showed very low urinary volume, 1.1 L, started on potassium citrate.  10/2014 Incidental 1.3 cm left distal ureteral stone mod/ severe hydro on CT at [facility].. S/p L URS, LL, stent on 10/2014. Follow up RUS with L renal atrophy, significant improvement of L hydro (mild).  CT abd with contrast for work of abd pain on 05/03/15 revealed chronic left renal atrophy which has progressed "mildy" since 2011 with chronic dilation of the left proximal/ mid ureter without obstructing stone.  Lasix renogramon 4/2017shows left kidney with 33% function, T1/2 15 minutes. T1/2 half her right kidney is 9 minutes.   Renal ultrasound on 09/28/2017 shows minimal left parenchymal thinning without hydronephrosis.  There is a 9 mm left lower pole stone seen on renal ultrasound.  This appears slightly larger than last year which is 5 mm.  Cr 0.8on 04/2017.  She did have a single UTI treated in 07/2017 which ultimately grew Enterococcus.  No other UTIs this year.  No dysuria.   Renal ultrasound on 04/28/2018 showed grossly stable 11 mm nonobstructive left renal calculus. No hydronephrosis or renal obstruction noted.     PMH: 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[facility])    Anemia    Anxiety    Chronic tension headache    Depression    GERD (gastroesophageal reflux disease)    History of pulmonary embolus (PE)    Hyperlipemia    Hypertension    IBS (irritable bowel syndrome)    Kidney stone    Low back pain    Situational stress      Surgical History: Past Surgical History:  Procedure Laterality Date   APPENDECTOMY     AUGMENTATION MAMMAPLASTY  1990's   BLADDER SURGERY     bladder tack   BREAST IMPLANT REMOVAL Left 07/15/2016   Procedure: REMOVAL OF EXPOSED LEFT BREAST IMPLANT;  Surgeon: Wallace Going, DO;  Location: [facility];  Service: Plastics;  Laterality: Left;   CHOLECYSTECTOMY     COLONOSCOPY     COLONOSCOPY WITH PROPOFOL N/A 07/22/2015   Procedure: COLONOSCOPY WITH PROPOFOL;  Surgeon: Manya Silvas, MD;  Location: [facility] ENDOSCOPY;  Service: Endoscopy;  Laterality: N/A;   CYSTOSCOPY/URETEROSCOPY/HOLMIUM LASER/STENT PLACEMENT Left 11/13/2014   Procedure: CYSTOSCOPY/URETEROSCOPY/HOLMIUM LASER/STENT PLACEMENT;  Surgeon: Hollice Espy, MD;  Location: [facility] ORS;  Service: Urology;  Laterality: Left;   HIP SURGERY     KYPHOPLASTY N/A 06/04/2015   Procedure: KYPHOPLASTY T11, T12;  Surgeon: Hessie Knows, MD;  Location: [facility] ORS;  Service: Orthopedics;  Laterality: N/A;   LITHOTRIPSY     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 Always use your most recent med list.        aspirin 81 MG tablet Take 81 mg by mouth daily.   buPROPion 150 MG 12 hr tablet Commonly known as:  WELLBUTRIN SR Take by mouth.   cephALEXin 500 MG capsule Commonly known as:  KEFLEX Take 1 capsule (500 mg total) by mouth 4 (four) times daily.   esomeprazole 20 MG capsule Commonly known as:  NEXIUM Take 20 mg by mouth daily at 12 noon.   fluticasone 50 MCG/ACT nasal spray Commonly known as:  FLONASE Place into the nose.   levocetirizine 5 MG  tablet Commonly known as:  XYZAL Take by mouth.   lisinopril 10 MG tablet Commonly known as:  PRINIVIL,ZESTRIL Take by mouth.   METAMUCIL SMOOTH TEXTURE 28.3 % Powd Generic drug:  Psyllium Take by mouth. 3 TSP IN  8 OZ WATER NIGHTLY   MULTI-VITAMINS Tabs Take by mouth.   niacin 500 MG tablet Take by mouth.   Omega 3 1000 MG Caps Take by mouth.   Potassium Citrate 15 MEQ (1620 MG) Tbcr Take 1 tablet by mouth 2 (two) times daily.   ZENPEP 20000-68000 units Cpep Generic drug:  Pancrelipase (Lip-Prot-Amyl) TAKE AS DIRECTED BEFORE MEALS AND SNACKS       Allergies:  Allergies  Allergen Reactions   Sulfa Antibiotics Other (See Comments)    Does not remember reaction-from childhood    Family History: Family History  Problem Relation Age of Onset   Alzheimer's disease Father    Kidney Stones Father    Stroke Mother    Diabetes type II Mother    Breast cancer Neg Hx     Social History:  reports that she has quit smoking. She has never used smokeless tobacco. She reports that she does not drink alcohol or use drugs.  ROS:                                        Physical Exam: There were no vitals taken for this visit.  Constitutional:  Alert and oriented, No acute distress. HEENT: [place] AT, moist mucus membranes.  Trachea midline, no masses. Cardiovascular: No clubbing, cyanosis, or edema. Respiratory: Normal respiratory effort, no increased work of breathing. GI: Abdomen is soft, nontender, nondistended, no abdominal masses GU: No CVA tenderness Lymph: No cervical or inguinal lymphadenopathy. Skin: No rashes, bruises or suspicious lesions. Neurologic: Grossly intact, no focal deficits, moving all 4 extremities. Psychiatric: Normal mood and affect.  Laboratory Data: Lab Results  Component Value Date   WBC 6.1 11/07/2014   HGB 14.3 11/07/2014   HCT 41.1 11/07/2014   MCV 89 11/07/2014   PLT 254 11/07/2014    Lab Results   Component Value Date   CREATININE 0.94 11/07/2014    No results found for: PSA  No results found for: TESTOSTERONE  No results found for: HGBA1C  Urinalysis    Component Value Date/Time   COLORURINE Yellow 01/16/2014 2104   APPEARANCEUR Clear 08/09/2017 1049   LABSPEC 1.013 01/16/2014 2104   PHURINE 7.0 01/16/2014 2104   GLUCOSEU Negative 08/09/2017 1049   GLUCOSEU Negative 01/16/2014 2104   HGBUR Negative 01/16/2014 2104   BILIRUBINUR Negative 08/09/2017 1049   BILIRUBINUR Negative 01/16/2014 2104   KETONESUR Negative 01/16/2014 2104   PROTEINUR Trace (A) 08/09/2017 1049   PROTEINUR Negative 01/16/2014 2104  NITRITE Negative 08/09/2017 1049   NITRITE Negative 01/16/2014 2104   LEUKOCYTESUR 2+ (A) 08/09/2017 1049   LEUKOCYTESUR Negative 01/16/2014 2104    Lab Results  Component Value Date   LABMICR See below: 08/09/2017   WBCUA 11-30 (A) 08/09/2017   RBCUA None seen 08/09/2017   LABEPIT 0-10 08/09/2017   MUCUS Present (A) 08/09/2017   BACTERIA Moderate (A) 08/09/2017    Pertinent Imaging: CLINICAL DATA:  Left nephrolithiasis.  EXAM: RENAL / URINARY TRACT ULTRASOUND COMPLETE  COMPARISON:  Ultrasound of October 18, 2017.  FINDINGS: Right Kidney:  Renal measurements: 10.1 x 4.2 x 3.9 cm = volume: 81 mL . Echogenicity within normal limits. No mass or hydronephrosis visualized.  Left Kidney:  Renal measurements: 10.6 x 3.5 x 3.2 cm = volume: 61 mL. 11 mm rounded echogenic focus is seen in lower pole of left kidney. Echogenicity within normal limits. No mass or hydronephrosis visualized.  Bladder:  Appears normal for degree of bladder distention.  IMPRESSION: Grossly stable 11 mm nonobstructive left renal calculus. No hydronephrosis or renal obstruction is noted. No other renal abnormality is noted.   Electronically Signed   By: Marijo Conception, M.D.   On: 04/28/2018 13:12  I have reviewed film independently and with pt.    Assessment & Plan:    1. Left renal atrophy Overall renal function stable Parenchyma appears unchanged on RUS (about 2 cm thick, stable) Will likely continue to follow with annual RUS    2. Left nephrolithiasis KUB ordered today Will call with results and discuss treatment options May be interested in prophylactic ESWL if stone sizable vs observation  3. Recurrent UTI Single infection this year Currently asymptomatic Nurse visit with UTI symptoms  No follow-ups on file.  [facility] [address] [place], [place] [address] [phone]  I, Lucas Mallow, am acting as a scribe for Dr. Hollice Espy,  {Add Scribe Attestation Statement}

## 2018-05-31 ENCOUNTER — Ambulatory Visit (INDEPENDENT_AMBULATORY_CARE_PROVIDER_SITE_OTHER): Payer: Medicare Other | Admitting: Urology

## 2018-05-31 ENCOUNTER — Encounter: Payer: Self-pay | Admitting: Urology

## 2018-05-31 VITALS — BP 93/61 | HR 83 | Ht <= 58 in | Wt 110.2 lb

## 2018-05-31 DIAGNOSIS — N200 Calculus of kidney: Secondary | ICD-10-CM | POA: Diagnosis not present

## 2018-05-31 NOTE — Progress Notes (Signed)
05/31/2018  2:31 PM   Kimberly Meyer [birth year] [id number]  Referring provider: Marinda Elk, MD [facility] [place] [address]  Chief Complaint  Patient presents with  . Follow-up    HPI: Kimberly Meyer is a 73 y.o. White or Caucasian female that presents today for a routine evaluation and management for nephrolithiasis.  Nephrolithiasis Prior to 2016 she had multiple recurent stones. S/p ESWL in Fall 2015. 2015 24-hr urine with very low urinary volume (1.1 L), stared on potassium citrate.  10/2014: incidental 1.3-cm left distal ureteral stone with moderate/severe hydronephrosis on CT at [facility]. S/p URS, LL, and stent. F/U RUS with L Renal Atrophy and significant improvement f left hydronephrosis (mild).  CT Abd w contrast in 04/2015 revealed chronic left renal atrophy which has progressed "mildly" since 2011 with chronic dilation of the left proximal/mid ureter without obstructing stone.  Lasix renogram in 07/2015 shows let kidney with 33% function, T1/2 15 minutes, T1/2 her right kidney is 9 minutes.  RUS in 09/2017 with minimal left parenchymal thinning without hydronephrosis.  There is a 9 mm left lower pole stone seen on renal ultrasound.  This appears slightly larger than last year which is 5 mm.  RUS in 04/2018 with grossly stable 11-mm non-obstructing left renal calculus. No hydronephrosis or renal obstruction is noted. No other renal abnormality is noted.  Denies flank/suprapubic pain and recent symptomatic UTIs. She reports that she was recently given antibiotics as she experienced occasional burning with urination and her UA had many WBCs.   PMH: Past Medical History:  Diagnosis Date  . 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 Anemia   . Anxiety   . Chronic tension headache   . Depression   . GERD (gastroesophageal reflux disease)   . History of pulmonary embolus (PE)   . Hyperlipemia   . Hypertension   . IBS (irritable bowel syndrome)    . Kidney stone   . Low back pain   . Situational stress     Surgical History: Past Surgical History:  Procedure Laterality Date  . APPENDECTOMY    . AUGMENTATION MAMMAPLASTY  1990's  . BLADDER SURGERY     bladder tack  . BREAST IMPLANT REMOVAL Left 07/15/2016   Procedure: REMOVAL OF EXPOSED LEFT BREAST IMPLANT;  Surgeon: Wallace Going, DO;  Location: [place];  Service: Plastics;  Laterality: Left;  . CHOLECYSTECTOMY    . COLONOSCOPY    . COLONOSCOPY WITH PROPOFOL N/A 07/22/2015   Procedure: COLONOSCOPY WITH PROPOFOL;  Surgeon: Manya Silvas, MD;  Location: [facility]COPY;  Service: Endoscopy;  Laterality: N/A;  . CYSTOSCOPY/URETEROSCOPY/HOLMIUM LASER/STENT PLACEMENT Left 11/13/2014   Procedure: CYSTOSCOPY/URETEROSCOPY/HOLMIUM LASER/STENT PLACEMENT;  Surgeon: Kimberly Espy, MD;  Location: [facility] ORS;  Service: Urology;  Laterality: Left;  . HIP SURGERY    . KYPHOPLASTY N/A 06/04/2015   Procedure: KYPHOPLASTY T11, T12;  Surgeon: Kimberly Knows, MD;  Location: [facility] ORS;  Service: Orthopedics;  Laterality: N/A;  . LITHOTRIPSY    . 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 PLACEMENT OF BREAST IMPLANTS    . TUBAL LIGATION    . VENTRAL HERNIA REPAIR    . WHIPPLE PROCEDURE      Home Medications:  Allergies as of 05/31/2018      Reactions   Sulfa Antibiotics Other (See Comments)   Does not remember reaction-from childhood      Medication List       Accurate  as of May 31, 2018  2:31 PM. Always use your most recent med list.        aspirin 81 MG tablet Take 81 mg by mouth daily.   buPROPion 150 MG 12 hr tablet Commonly known as:  WELLBUTRIN SR Take by mouth.   esomeprazole 20 MG capsule Commonly known as:  NEXIUM Take 20 mg by mouth daily at 12 noon.   fluticasone 50 MCG/ACT nasal spray Commonly known as:  FLONASE Place into the nose.   levocetirizine 5 MG tablet Commonly known as:  XYZAL Take by mouth.   lisinopril 10  MG tablet Commonly known as:  PRINIVIL,ZESTRIL Take by mouth.   MULTI-VITAMINS Tabs Take by mouth.   niacin 500 MG tablet Take by mouth.   Omega 3 1000 MG Caps Take by mouth.   Potassium Citrate 15 MEQ (1620 MG) Tbcr Take 1 tablet by mouth 2 (two) times daily.   ZENPEP 20000-68000 units Cpep Generic drug:  Pancrelipase (Lip-Prot-Amyl) TAKE AS DIRECTED BEFORE MEALS AND SNACKS       Allergies:  Allergies  Allergen Reactions  . Sulfa Antibiotics Other (See Comments)    Does not remember reaction-from childhood    Family History: Family History  Problem Relation Age of Onset  . Alzheimer's disease Father   . Kidney Stones Father   . Stroke Mother   . Diabetes type II Mother   . Breast cancer Neg Hx     Social History:  reports that she has quit smoking. She has never used smokeless tobacco. She reports that she does not drink alcohol or use drugs.  ROS: UROLOGY Frequent Urination?: No Hard to postpone urination?: No Burning/pain with urination?: No Get up at night to urinate?: Yes Leakage of urine?: No Urine stream starts and stops?: No Trouble starting stream?: No Do you have to strain to urinate?: No Blood in urine?: No Urinary tract infection?: No Sexually transmitted disease?: No Injury to kidneys or bladder?: No Painful intercourse?: No Weak stream?: No Currently pregnant?: No Vaginal bleeding?: No Last menstrual period?: n  Gastrointestinal Nausea?: No Vomiting?: No Indigestion/heartburn?: No Diarrhea?: No Constipation?: No  Constitutional Fever: No Night sweats?: No Weight loss?: No Fatigue?: No  Skin Skin rash/lesions?: No Itching?: No  Eyes Blurred vision?: No Double vision?: No  Ears/Nose/Throat Sore throat?: No Sinus problems?: No  Hematologic/Lymphatic Swollen glands?: No Easy bruising?: No  Cardiovascular Leg swelling?: No Chest pain?: No  Respiratory Cough?: Yes Shortness of breath?: No  Endocrine Excessive  thirst?: Yes  Musculoskeletal Back pain?: No Joint pain?: No  Neurological Headaches?: No Dizziness?: No  Psychologic Depression?: No Anxiety?: Yes  Physical Exam: BP 93/61 (BP Location: Left Arm, Patient Position: Sitting, Cuff Size: Normal)   Pulse 83   Ht 4\' 10"  (1.473 m)   Wt 110 lb 3.2 oz (50 kg)   BMI 23.03 kg/m   Constitutional:  Alert and oriented, No acute distress. Respiratory: Normal respiratory effort, no increased work of breathing. Head: Normocephalic and Atraumatic. GU: No CVA tenderness Skin: No rashes, bruises or suspicious lesions. Neurologic: Grossly intact, no focal deficits, moving all 4 extremities. Psychiatric: Normal mood and affect.  Pertinent Imaging: US Renal  Result Date: 04/28/2018  CLINICAL DATA:  Left nephrolithiasis.   EXAM: RENAL / URINARY TRACT ULTRASOUND COMPLETE COMPARISON:  Ultrasound of October 18, 2017.   FINDINGS:   Right Kidney: Renal measurements: 10.1 x 4.2 x 3.9 cm = volume: 81 mL . Echogenicity within normal limits. No mass or hydronephrosis visualized.  Left Kidney: Renal measurements: 10.6 x 3.5 x 3.2 cm = volume: 61 mL. 11 mm rounded echogenic focus is seen in lower pole of left kidney. Echogenicity within normal limits. No mass or hydronephrosis visualized. Bladder: Appears normal for degree of bladder distention.   IMPRESSION:  Grossly stable 11 mm nonobstructive left renal calculus. No hydronephrosis or renal obstruction is noted. No other renal abnormality is noted.   Electronically Signed    By: Marijo Conception, M.D.   On: 04/28/2018 13:12   I have personally reviewed all the images today.  Assessment & Plan:    1. Nephrolithiasiss  - Left renal calculi has grown to 11-mm as visualized on 04/2018 RUS  - Discussed EWSL v. ureteroscopy with stent placement vs continued observation. Discussed possible need for staged procedure with ESWL given how/if the stone breaks   - Explained that it is importantt that the  paitnet stop aspirin 5 days prior to procedure  Return for ESWL.  [facility] [place] [address] [place], [place] [address] [phone]  I, Stephania Fragmin , am acting as a scribe for Kimberly Espy, MD  I have reviewed the above documentation for accuracy and completeness, and I agree with the above.   Kimberly Espy, MD

## 2018-05-31 NOTE — H&P (View-Only) (Signed)
05/31/2018  2:31 PM   Kimberly Meyer 14-Jun-1945 [id number]  Referring provider: Marinda Elk, MD [facility] [place] [address]  Chief Complaint  Patient presents with  . Follow-up    HPI: Kimberly Meyer is a 73 y.o. White or Caucasian female that presents today for a routine evaluation and management for nephrolithiasis.  Nephrolithiasis Prior to 2016 she had multiple recurent stones. S/p ESWL in Fall 2015. 2015 24-hr urine with very low urinary volume (1.1 L), stared on potassium citrate.  10/2014: incidental 1.3-cm left distal ureteral stone with moderate/severe hydronephrosis on CT at [facility]. S/p URS, LL, and stent. F/U RUS with L Renal Atrophy and significant improvement f left hydronephrosis (mild).  CT Abd w contrast in 04/2015 revealed chronic left renal atrophy which has progressed "mildly" since 2011 with chronic dilation of the left proximal/mid ureter without obstructing stone.  Lasix renogram in 07/2015 shows let kidney with 33% function, T1/2 15 minutes, T1/2 her right kidney is 9 minutes.  RUS in 09/2017 with minimal left parenchymal thinning without hydronephrosis.  There is a 9 mm left lower pole stone seen on renal ultrasound.  This appears slightly larger than last year which is 5 mm.  RUS in 04/2018 with grossly stable 11-mm non-obstructing left renal calculus. No hydronephrosis or renal obstruction is noted. No other renal abnormality is noted.  Denies flank/suprapubic pain and recent symptomatic UTIs. She reports that she was recently given antibiotics as she experienced occasional burning with urination and her UA had many WBCs.   PMH: Past Medical History:  Diagnosis Date  . 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 Anemia   . Anxiety   . Chronic tension headache   . Depression   . GERD (gastroesophageal reflux disease)   . History of pulmonary embolus (PE)   . Hyperlipemia   . Hypertension   . IBS (irritable bowel syndrome)    . Kidney stone   . Low back pain   . Situational stress     Surgical History: Past Surgical History:  Procedure Laterality Date  . APPENDECTOMY    . AUGMENTATION MAMMAPLASTY  1990's  . BLADDER SURGERY     bladder tack  . BREAST IMPLANT REMOVAL Left 07/15/2016   Procedure: REMOVAL OF EXPOSED LEFT BREAST IMPLANT;  Surgeon: Wallace Going, DO;  Location: [place];  Service: Plastics;  Laterality: Left;  . CHOLECYSTECTOMY    . COLONOSCOPY    . COLONOSCOPY WITH PROPOFOL N/A 07/22/2015   Procedure: COLONOSCOPY WITH PROPOFOL;  Surgeon: Manya Silvas, MD;  [facility] END[facility]COPY;  Service: Endoscopy;  Laterality: N/A;  . CYSTOSCOPY/URETEROSCOPY/HOLMIUM LASER/STENT PLACEMENT Left 11/13/2014   Procedure: CYSTOSCOPY/URETEROSCOPY/HOLMIUM LASER/STENT PLACEMENT;  Surgeon: Hollice Espy, MD;  Location: [facility] ORS;  Service: Urology;  Laterality: Left;  . HIP SURGERY    . KYPHOPLASTY N/A 06/04/2015   Procedure: KYPHOPLASTY T11, T12;  Surgeon: Hessie Knows, MD;  Location: [facility] ORS;  Service: Orthopedics;  Laterality: N/A;  . LITHOTRIPSY    . 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 PLACEMENT OF BREAST IMPLANTS    . TUBAL LIGATION    . VENTRAL HERNIA REPAIR    . WHIPPLE PROCEDURE      Home Medications:  Allergies as of 05/31/2018      Reactions   Sulfa Antibiotics Other (See Comments)   Does not remember reaction-from childhood      Medication List       Accurate  as of May 31, 2018  2:31 PM. Always use your most recent med list.        aspirin 81 MG tablet Take 81 mg by mouth daily.   buPROPion 150 MG 12 hr tablet Commonly known as:  WELLBUTRIN SR Take by mouth.   esomeprazole 20 MG capsule Commonly known as:  NEXIUM Take 20 mg by mouth daily at 12 noon.   fluticasone 50 MCG/ACT nasal spray Commonly known as:  FLONASE Place into the nose.   levocetirizine 5 MG tablet Commonly known as:  XYZAL Take by mouth.   lisinopril 10  MG tablet Commonly known as:  PRINIVIL,ZESTRIL Take by mouth.   MULTI-VITAMINS Tabs Take by mouth.   niacin 500 MG tablet Take by mouth.   Omega 3 1000 MG Caps Take by mouth.   Potassium Citrate 15 MEQ (1620 MG) Tbcr Take 1 tablet by mouth 2 (two) times daily.   ZENPEP 20000-68000 units Cpep Generic drug:  Pancrelipase (Lip-Prot-Amyl) TAKE AS DIRECTED BEFORE MEALS AND SNACKS       Allergies:  Allergies  Allergen Reactions  . Sulfa Antibiotics Other (See Comments)    Does not remember reaction-from childhood    Family History: Family History  Problem Relation Age of Onset  . Alzheimer's disease Father   . Kidney Stones Father   . Stroke Mother   . Diabetes type II Mother   . Breast cancer Neg Hx     Social History:  reports that she has quit smoking. She has never used smokeless tobacco. She reports that she does not drink alcohol or use drugs.  ROS: UROLOGY Frequent Urination?: No Hard to postpone urination?: No Burning/pain with urination?: No Get up at night to urinate?: Yes Leakage of urine?: No Urine stream starts and stops?: No Trouble starting stream?: No Do you have to strain to urinate?: No Blood in urine?: No Urinary tract infection?: No Sexually transmitted disease?: No Injury to kidneys or bladder?: No Painful intercourse?: No Weak stream?: No Currently pregnant?: No Vaginal bleeding?: No Last menstrual period?: n  Gastrointestinal Nausea?: No Vomiting?: No Indigestion/heartburn?: No Diarrhea?: No Constipation?: No  Constitutional Fever: No Night sweats?: No Weight loss?: No Fatigue?: No  Skin Skin rash/lesions?: No Itching?: No  Eyes Blurred vision?: No Double vision?: No  Ears/Nose/Throat Sore throat?: No Sinus problems?: No  Hematologic/Lymphatic Swollen glands?: No Easy bruising?: No  Cardiovascular Leg swelling?: No Chest pain?: No  Respiratory Cough?: Yes Shortness of breath?: No  Endocrine Excessive  thirst?: Yes  Musculoskeletal Back pain?: No Joint pain?: No  Neurological Headaches?: No Dizziness?: No  Psychologic Depression?: No Anxiety?: Yes  Physical Exam: BP 93/61 (BP Location: Left Arm, Patient Position: Sitting, Cuff Size: Normal)   Pulse 83   Ht 4\' 10"  (1.473 m)   Wt 110 lb 3.2 oz (50 kg)   BMI 23.03 kg/m   Constitutional:  Alert and oriented, No acute distress. Respiratory: Normal respiratory effort, no increased work of breathing. Head: Normocephalic and Atraumatic. GU: No CVA tenderness Skin: No rashes, bruises or suspicious lesions. Neurologic: Grossly intact, no focal deficits, moving all 4 extremities. Psychiatric: Normal mood and affect.  Pertinent Imaging: US Renal  Result Date: 04/28/2018  CLINICAL DATA:  Left nephrolithiasis.   EXAM: RENAL / URINARY TRACT ULTRASOUND COMPLETE COMPARISON:  Ultrasound of October 18, 2017.   FINDINGS:   Right Kidney: Renal measurements: 10.1 x 4.2 x 3.9 cm = volume: 81 mL . Echogenicity within normal limits. No mass or hydronephrosis visualized.  Left Kidney: Renal measurements: 10.6 x 3.5 x 3.2 cm = volume: 61 mL. 11 mm rounded echogenic focus is seen in lower pole of left kidney. Echogenicity within normal limits. No mass or hydronephrosis visualized. Bladder: Appears normal for degree of bladder distention.   IMPRESSION:  Grossly stable 11 mm nonobstructive left renal calculus. No hydronephrosis or renal obstruction is noted. No other renal abnormality is noted.   Electronically Signed    By: Marijo Conception, M.D.   On: 04/28/2018 13:12   I have personally reviewed all the images today.  Assessment & Plan:    1. Nephrolithiasiss  - Left renal calculi has grown to 11-mm as visualized on 04/2018 RUS  - Discussed EWSL v. ureteroscopy with stent placement vs continued observation. Discussed possible need for staged procedure with ESWL given how/if the stone breaks   - Explained that it is importantt that the  paitnet stop aspirin 5 days prior to procedure  Return for ESWL.  [facility] [address] [place], [place] [address] [phone]  I, Stephania Fragmin , am acting as a scribe for Hollice Espy, MD  I have reviewed the above documentation for accuracy and completeness, and I agree with the above.   Hollice Espy, MD

## 2018-06-01 ENCOUNTER — Other Ambulatory Visit: Payer: Self-pay | Admitting: Radiology

## 2018-06-01 ENCOUNTER — Telehealth: Payer: Self-pay | Admitting: Radiology

## 2018-06-01 DIAGNOSIS — N200 Calculus of kidney: Secondary | ICD-10-CM

## 2018-06-01 NOTE — Telephone Encounter (Signed)
LMOM on both home & cell numbers for return call to discuss instructions for Shockwave Lithotripsy scheduled 06/09/2018.

## 2018-06-02 ENCOUNTER — Other Ambulatory Visit: Payer: Medicare Other

## 2018-06-02 DIAGNOSIS — N200 Calculus of kidney: Secondary | ICD-10-CM

## 2018-06-02 LAB — URINALYSIS, COMPLETE
Bilirubin, UA: NEGATIVE
Glucose, UA: NEGATIVE
Ketones, UA: NEGATIVE
Nitrite, UA: NEGATIVE
Protein, UA: NEGATIVE
Specific Gravity, UA: 1.02 (ref 1.005–1.030)
Urobilinogen, Ur: 0.2 mg/dL (ref 0.2–1.0)
pH, UA: 6 (ref 5.0–7.5)

## 2018-06-02 LAB — MICROSCOPIC EXAMINATION

## 2018-06-06 ENCOUNTER — Telehealth: Payer: Self-pay | Admitting: Family Medicine

## 2018-06-06 LAB — CULTURE, URINE COMPREHENSIVE

## 2018-06-06 MED ORDER — CIPROFLOXACIN HCL 500 MG PO TABS: 500.0000 mg | ORAL_TABLET | Freq: Two times a day (BID) | ORAL | 0 refills | Status: DC

## 2018-06-06 NOTE — Telephone Encounter (Signed)
Patient notified and ABX was sent to pharmacy. 

## 2018-06-06 NOTE — Telephone Encounter (Signed)
-----   Message from Hollice Espy, MD sent at 06/06/2018  2:22 PM EST ----- Preop urine culture grew enterococcus, likely asymptomatic bacteriuria but in light of upcoming surgical intervention, would recommend this be treated.  Please start Cipro 500 mg twice daily x7 days starting today.  Hollice Espy, MD

## 2018-06-07 ENCOUNTER — Other Ambulatory Visit: Payer: Self-pay | Admitting: Radiology

## 2018-06-07 ENCOUNTER — Telehealth: Payer: Self-pay | Admitting: Radiology

## 2018-06-07 NOTE — Telephone Encounter (Signed)
Notified patient of extracorporeal shockwave lithotripsy scheduled on 06/09/2018 at 6:15. Patient should arrive at that time to Same Day Surgery. Pre-op instructions for lithotripsy were discussed with patient.    Advised patient to hold NSAIDS & aspirin products for 3 days prior to the procedure beginning on 06/06/2018 & patient states this was done.  Patient's questions were answered & she expresses understanding of these instructions.    Ranell Patrick, RN

## 2018-06-09 ENCOUNTER — Ambulatory Visit
Admission: RE | Admit: 2018-06-09 | Discharge: 2018-06-09 | Disposition: A | Discharge status: Home / Self Care | Payer: Medicare Other | Attending: Urology | Admitting: Urology

## 2018-06-09 ENCOUNTER — Other Ambulatory Visit: Payer: Self-pay

## 2018-06-09 ENCOUNTER — Ambulatory Visit: Payer: Medicare Other

## 2018-06-09 ENCOUNTER — Encounter
Admission: RE | Disposition: A | Discharge status: Home / Self Care | Payer: Self-pay | Source: Home / Self Care | Attending: Urology

## 2018-06-09 ENCOUNTER — Encounter: Payer: Self-pay | Admitting: *Deleted

## 2018-06-09 DIAGNOSIS — N200 Calculus of kidney: Secondary | ICD-10-CM | POA: Diagnosis present

## 2018-06-09 DIAGNOSIS — F329 Major depressive disorder, single episode, unspecified: Secondary | ICD-10-CM | POA: Insufficient documentation

## 2018-06-09 DIAGNOSIS — Z87442 Personal history of urinary calculi: Secondary | ICD-10-CM | POA: Diagnosis not present

## 2018-06-09 DIAGNOSIS — I10 Essential (primary) hypertension: Secondary | ICD-10-CM | POA: Insufficient documentation

## 2018-06-09 DIAGNOSIS — Z882 Allergy status to sulfonamides status: Secondary | ICD-10-CM | POA: Diagnosis not present

## 2018-06-09 DIAGNOSIS — K219 Gastro-esophageal reflux disease without esophagitis: Secondary | ICD-10-CM | POA: Diagnosis not present

## 2018-06-09 DIAGNOSIS — Z7951 Long term (current) use of inhaled steroids: Secondary | ICD-10-CM | POA: Insufficient documentation

## 2018-06-09 DIAGNOSIS — Z79899 Other long term (current) drug therapy: Secondary | ICD-10-CM | POA: Diagnosis not present

## 2018-06-09 DIAGNOSIS — Z86711 Personal history of pulmonary embolism: Secondary | ICD-10-CM | POA: Diagnosis not present

## 2018-06-09 DIAGNOSIS — Z841 Family history of disorders of kidney and ureter: Secondary | ICD-10-CM | POA: Diagnosis not present

## 2018-06-09 DIAGNOSIS — Z7982 Long term (current) use of aspirin: Secondary | ICD-10-CM | POA: Insufficient documentation

## 2018-06-09 DIAGNOSIS — Z87891 Personal history of nicotine dependence: Secondary | ICD-10-CM | POA: Insufficient documentation

## 2018-06-09 HISTORY — PX: EXTRACORPOREAL SHOCK WAVE LITHOTRIPSY: SHX1557

## 2018-06-09 LAB — GLUCOSE, CAPILLARY: Glucose-Capillary: 144 mg/dL — ABNORMAL HIGH (ref 70–99)

## 2018-06-09 SURGERY — LITHOTRIPSY, ESWL
Anesthesia: Moderate Sedation | Laterality: Left

## 2018-06-09 MED ORDER — DIPHENHYDRAMINE HCL 25 MG PO CAPS
25.0000 mg | ORAL_CAPSULE | ORAL | Status: AC
  Administered 2018-06-09: 25 mg via ORAL

## 2018-06-09 MED ORDER — DIPHENHYDRAMINE HCL 25 MG PO CAPS
ORAL_CAPSULE | ORAL | Status: AC
  Administered 2018-06-09: 25 mg via ORAL
  Filled 2018-06-09: qty 1

## 2018-06-09 MED ORDER — ONDANSETRON HCL 2 MG/ML IV SOLN: 4.0000 mg | Freq: Once | INTRAVENOUS | Status: DC

## 2018-06-09 MED ORDER — CIPROFLOXACIN HCL 500 MG PO TABS
500.0000 mg | ORAL_TABLET | ORAL | Status: AC
  Administered 2018-06-09: 500 mg via ORAL

## 2018-06-09 MED ORDER — CIPROFLOXACIN HCL 500 MG PO TABS
ORAL_TABLET | ORAL | Status: AC
  Administered 2018-06-09: 500 mg via ORAL
  Filled 2018-06-09: qty 1

## 2018-06-09 MED ORDER — TAMSULOSIN HCL 0.4 MG PO CAPS: 0.4000 mg | ORAL_CAPSULE | Freq: Every day | ORAL | 0 refills | Status: DC

## 2018-06-09 MED ORDER — ONDANSETRON HCL 4 MG/2ML IJ SOLN
INTRAMUSCULAR | Status: AC
  Administered 2018-06-09: 4 mg via INTRAVENOUS
  Filled 2018-06-09: qty 2

## 2018-06-09 MED ORDER — SODIUM CHLORIDE 0.9 % IV SOLN
INTRAVENOUS | Status: DC
  Administered 2018-06-09: 07:00:00 via INTRAVENOUS

## 2018-06-09 MED ORDER — ONDANSETRON HCL 4 MG/2ML IJ SOLN
4.0000 mg | Freq: Once | INTRAMUSCULAR | Status: AC
  Administered 2018-06-09: 4 mg via INTRAVENOUS

## 2018-06-09 MED ORDER — HYDROCODONE-ACETAMINOPHEN 5-325 MG PO TABS: 1.0000 | ORAL_TABLET | ORAL | 0 refills | Status: DC | PRN

## 2018-06-09 MED ORDER — DIAZEPAM 5 MG PO TABS
10.0000 mg | ORAL_TABLET | ORAL | Status: AC
  Administered 2018-06-09: 10 mg via ORAL

## 2018-06-09 MED ORDER — DIAZEPAM 5 MG PO TABS
ORAL_TABLET | ORAL | Status: AC
  Administered 2018-06-09: 10 mg via ORAL
  Filled 2018-06-09: qty 2

## 2018-06-09 NOTE — Discharge Instructions (Signed)
Lithotripsy, Care After °This sheet gives you information about how to care for yourself after your procedure. Your health care provider may also give you more specific instructions. If you have problems or questions, contact your health care provider. °What can I expect after the procedure? °After the procedure, it is common to have: °· Some blood in your urine. This should only last for a few days. °· Soreness in your back, sides, or upper abdomen for a few days. °· Blotches or bruises on your back where the pressure wave entered the skin. °· Pain, discomfort, or nausea when pieces (fragments) of the kidney stone move through the tube that carries urine from the kidney to the bladder (ureter). Stone fragments may pass soon after the procedure, but they may continue to pass for up to 4-8 weeks. °? If you have severe pain or nausea, contact your health care provider. This may be caused by a large stone that was not broken up, and this may mean that you need more treatment. °· Some pain or discomfort during urination. °· Some pain or discomfort in the lower abdomen or (in men) at the base of the penis. °Follow these instructions at home: °Medicines °· Take over-the-counter and prescription medicines only as told by your health care provider. °· If you were prescribed an antibiotic medicine, take it as told by your health care provider. Do not stop taking the antibiotic even if you start to feel better. °· Do not drive for 24 hours if you were given a medicine to help you relax (sedative). °· Do not drive or use heavy machinery while taking prescription pain medicine. °Eating and drinking ° °  ° °· Drink enough water and fluids to keep your urine clear or pale yellow. This helps any remaining pieces of the stone to pass. It can also help prevent new stones from forming. °· Eat plenty of fresh fruits and vegetables. °· Follow instructions from your health care provider about eating and drinking restrictions. You may be  instructed: °? To reduce how much salt (sodium) you eat or drink. Check ingredients and nutrition facts on packaged foods and beverages. °? To reduce how much meat you eat. °· Eat the recommended amount of calcium for your age and gender. Ask your health care provider how much calcium you should have. °General instructions °· Get plenty of rest. °· Most people can resume normal activities 1-2 days after the procedure. Ask your health care provider what activities are safe for you. °· Your health care provider may direct you to lie in a certain position (postural drainage) and tap firmly (percuss) over your kidney area to help stone fragments pass. Follow instructions as told by your health care provider. °· If directed, strain all urine through the strainer that was provided by your health care provider. °? Keep all fragments for your health care provider to see. Any stones that are found may be sent to a medical lab for examination. The stone may be as small as a grain of salt. °· Keep all follow-up visits as told by your health care provider. This is important. °Contact a health care provider if: °· You have pain that is severe or does not get better with medicine. °· You have nausea that is severe or does not go away. °· You have blood in your urine longer than your health care provider told you to expect. °· You have more blood in your urine. °· You have pain during urination that does   not go away.  You urinate more frequently than usual and this does not go away.  You develop a rash or any other possible signs of an allergic reaction. Get help right away if:  You have severe pain in your back, sides, or upper abdomen.  You have severe pain while urinating.  Your urine is very dark red.  You have blood in your stool (feces).  You cannot pass any urine at all.  You feel a strong urge to urinate after emptying your bladder.  You have a fever or chills.  You develop shortness of breath,  difficulty breathing, or chest pain.  You have severe nausea that leads to persistent vomiting.  You faint. Summary  After this procedure, it is common to have some pain, discomfort, or nausea when pieces (fragments) of the kidney stone move through the tube that carries urine from the kidney to the bladder (ureter). If this pain or nausea is severe, however, you should contact your health care provider.  Most people can resume normal activities 1-2 days after the procedure. Ask your health care provider what activities are safe for you.  Drink enough water and fluids to keep your urine clear or pale yellow. This helps any remaining pieces of the stone to pass, and it can help prevent new stones from forming.  If directed, strain your urine and keep all fragments for your health care provider to see. Fragments or stones may be as small as a grain of salt.  Get help right away if you have severe pain in your back, sides, or upper abdomen or have severe pain while urinating. This information is not intended to replace advice given to you by your health care provider. Make sure you discuss any questions you have with your health care provider. Document Released: 05/03/2007 Document Revised: 09/22/2017 Document Reviewed: 03/04/2016 Elsevier Interactive Patient Education  2019 [place]   1) The drugs that you were given will stay in your system until tomorrow so for the next 24 hours you should not:  A) Drive an automobile B) Make any legal decisions C) Drink any alcoholic beverage   2) You may resume regular meals tomorrow.  Today it is better to start with liquids and gradually work up to solid foods.  You may eat anything you prefer, but it is better to start with liquids, then soup and crackers, and gradually work up to solid foods.   3) Please notify your doctor immediately if you have any unusual bleeding, trouble breathing,  redness and pain at the surgery site, drainage, fever, or pain not relieved by medication.    4) Additional Instructions:        Please contact your physician with any problems or Same Day Surgery at [phone], Monday through Friday 6 am to 4 pm, or  at [facility] number at [phone].

## 2018-06-09 NOTE — Interval H&P Note (Signed)
History and Physical Interval Note:  06/09/2018 8:51 AM  Kimberly Meyer  has presented today for surgery, with the diagnosis of Kidney stone  The various methods of treatment have been discussed with the patient and family. After consideration of risks, benefits and other options for treatment, the patient has consented to  Procedure(s): EXTRACORPOREAL SHOCK WAVE LITHOTRIPSY (ESWL) (Left) as a surgical intervention .  The patient's history has been reviewed, patient examined, no change in status, stable for surgery.  I have reviewed the patient's chart and labs.  Questions were answered to the patient's satisfaction.     [place]

## 2018-06-10 ENCOUNTER — Encounter: Payer: Self-pay | Admitting: Urology

## 2018-06-23 ENCOUNTER — Ambulatory Visit
Admission: RE | Admit: 2018-06-23 | Discharge: 2018-06-23 | Disposition: A | Discharge status: Home / Self Care | Payer: Medicare Other | Source: Ambulatory Visit | Attending: Urology | Admitting: Urology

## 2018-06-23 ENCOUNTER — Ambulatory Visit (INDEPENDENT_AMBULATORY_CARE_PROVIDER_SITE_OTHER): Payer: Medicare Other | Admitting: Urology

## 2018-06-23 ENCOUNTER — Ambulatory Visit
Admission: RE | Admit: 2018-06-23 | Discharge: 2018-06-23 | Disposition: A | Discharge status: Home / Self Care | Payer: Medicare Other | Attending: Urology | Admitting: Urology

## 2018-06-23 ENCOUNTER — Ambulatory Visit: Payer: Medicare Other | Admitting: Urology

## 2018-06-23 ENCOUNTER — Encounter: Payer: Self-pay | Admitting: Urology

## 2018-06-23 VITALS — BP 103/65 | HR 91 | Ht <= 58 in | Wt 109.0 lb

## 2018-06-23 DIAGNOSIS — N200 Calculus of kidney: Secondary | ICD-10-CM | POA: Diagnosis not present

## 2018-06-23 NOTE — Progress Notes (Signed)
06/23/2018  10:19 AM   Kimberly Meyer [birth year] [id number]  Referring provider: Marinda Elk, MD [facility] [place], [place] [address]  Chief Complaint  Patient presents with  . Nephrolithiasis    HPI: Kimberly Meyer is a 73 yo F who presents today for a 2 week f/u post ESWL for the evaluation and management of nephrolithiasis.   RUS in 09/2017 with minimal left parenchymal thinning without hydronephrosis. There is a 9 mm left lower pole stone seen on renal ultrasound. This appears slightly larger than last year which is 5 mm.  RUS in 04/2018 with grossly stable 11-mm non-obstructing left renal calculus. No hydronephrosis or renal obstruction is noted. No other renal abnormality is noted.  KUB on 06/09/2018 showed a 7 mm left renal calculus. She underwent ESWL on 06/09/2018.   She reports of passing a stone after her ESWL. She did not know she had to bring the stone in for a stone analysis. She denies gross hematuria or pain. Her KUB showed no stone present.   PMH: Past Medical History:  Diagnosis Date  . 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 Anemia   . Anxiety   . Chronic tension headache   . Depression   . GERD (gastroesophageal reflux disease)   . History of pulmonary embolus (PE)   . Hyperlipemia   . Hypertension   . IBS (irritable bowel syndrome)   . Kidney stone   . Low back pain   . Situational stress     Surgical History: Past Surgical History:  Procedure Laterality Date  . APPENDECTOMY    . AUGMENTATION MAMMAPLASTY  1990's  . BLADDER SURGERY     bladder tack  . BREAST IMPLANT REMOVAL Left 07/15/2016   Procedure: REMOVAL OF EXPOSED LEFT BREAST IMPLANT;  Surgeon: Wallace Going, DO;  Location: [place];  Service: Plastics;  Laterality: Left;  . CHOLECYSTECTOMY    . COLONOSCOPY    . COLONOSCOPY WITH PROPOFOL N/A 07/22/2015   Procedure: COLONOSCOPY WITH PROPOFOL;  Surgeon: Manya Silvas, MD;  Location:  [facility] ENDOSCOPY;  Service: Endoscopy;  Laterality: N/A;  . CYSTOSCOPY/URETEROSCOPY/HOLMIUM LASER/STENT PLACEMENT Left 11/13/2014   Procedure: CYSTOSCOPY/URETEROSCOPY/HOLMIUM LASER/STENT PLACEMENT;  Surgeon: Hollice Espy, MD;  Location: [facility] ORS;  Service: Urology;  Laterality: Left;  . EXTRACORPOREAL SHOCK WAVE LITHOTRIPSY Left 06/09/2018   Procedure: EXTRACORPOREAL SHOCK WAVE LITHOTRIPSY (ESWL);  Surgeon: Abbie Sons, MD;  Location: [facility] ORS;  Service: Urology;  Laterality: Left;  . HIP SURGERY    . KYPHOPLASTY N/A 06/04/2015   Procedure: KYPHOPLASTY T11, T12;  Surgeon: Hessie Knows, MD;  Location: [facility] ORS;  Service: Orthopedics;  Laterality: N/A;  . LITHOTRIPSY    . 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 PLACEMENT OF BREAST IMPLANTS    . TUBAL LIGATION    . VENTRAL HERNIA REPAIR    . WHIPPLE PROCEDURE      Home Medications:  Allergies as of 06/23/2018      Reactions   Sulfa Antibiotics Other (See Comments)   Does not remember reaction-from childhood      Medication List       Accurate as of June 23, 2018 10:19 AM. Always use your most recent med list.        alendronate 70 MG tablet Commonly known as:  FOSAMAX   aspirin 81 MG tablet Take 81 mg by mouth daily.   buPROPion 150 MG 12 hr tablet Commonly known  as:  WELLBUTRIN SR Take by mouth.   ciprofloxacin 500 MG tablet Commonly known as:  CIPRO Take 1 tablet (500 mg total) by mouth every 12 (twelve) hours.   esomeprazole 20 MG capsule Commonly known as:  NEXIUM Take 20 mg by mouth daily at 12 noon.   fluticasone 50 MCG/ACT nasal spray Commonly known as:  FLONASE Place into the nose.   HYDROcodone-acetaminophen 5-325 MG tablet Commonly known as:  NORCO/VICODIN Take 1 tablet by mouth every 4 (four) hours as needed for moderate pain.   levocetirizine 5 MG tablet Commonly known as:  XYZAL Take by mouth.   lisinopril 10 MG tablet Commonly known as:  PRINIVIL,ZESTRIL Take by  mouth.   MULTI-VITAMINS Tabs Take by mouth.   niacin 500 MG tablet Take by mouth.   Omega 3 1000 MG Caps Take by mouth.   Potassium Citrate 15 MEQ (1620 MG) Tbcr Take 1 tablet by mouth 2 (two) times daily.   tamsulosin 0.4 MG Caps capsule Commonly known as:  FLOMAX Take 1 capsule (0.4 mg total) by mouth daily after breakfast.   ZENPEP 20000-68000 units Cpep Generic drug:  Pancrelipase (Lip-Prot-Amyl) TAKE AS DIRECTED BEFORE MEALS AND SNACKS       Allergies:  Allergies  Allergen Reactions  . Sulfa Antibiotics Other (See Comments)    Does not remember reaction-from childhood    Family History: Family History  Problem Relation Age of Onset  . Alzheimer's disease Father   . Kidney Stones Father   . Stroke Mother   . Diabetes type II Mother   . Breast cancer Neg Hx     Social History:  reports that she has quit smoking. She has never used smokeless tobacco. She reports that she does not drink alcohol or use drugs.  ROS: UROLOGY Frequent Urination?: No Hard to postpone urination?: Yes Burning/pain with urination?: No Get up at night to urinate?: Yes Leakage of urine?: No Urine stream starts and stops?: No Trouble starting stream?: No Do you have to strain to urinate?: No Blood in urine?: No Urinary tract infection?: No Sexually transmitted disease?: No Injury to kidneys or bladder?: No Painful intercourse?: No Weak stream?: No Currently pregnant?: No Vaginal bleeding?: No Last menstrual period?: n  Gastrointestinal Nausea?: No Vomiting?: No Indigestion/heartburn?: No Diarrhea?: No Constipation?: No  Constitutional Fever: No Night sweats?: No Weight loss?: No Fatigue?: No  Skin Skin rash/lesions?: No Itching?: No  Eyes Blurred vision?: No Double vision?: No  Ears/Nose/Throat Sore throat?: No Sinus problems?: No  Hematologic/Lymphatic Swollen glands?: No Easy bruising?: No  Cardiovascular Leg swelling?: No Chest pain?:  No  Respiratory Cough?: No Shortness of breath?: No  Endocrine Excessive thirst?: No  Musculoskeletal Back pain?: No Joint pain?: No  Neurological Headaches?: No Dizziness?: No  Psychologic Depression?: No Anxiety?: No  Physical Exam: BP 103/65 (BP Location: Left Arm, Patient Position: Sitting)   Pulse 91   Ht 4\' 10"  (1.473 m)   Wt 109 lb (49.4 kg)   BMI 22.78 kg/m   Constitutional:  Alert and oriented, No acute distress. HEENT: [place] AT, moist mucus membranes.  Trachea midline, no masses. Cardiovascular: No clubbing, cyanosis, or edema. Respiratory: Normal respiratory effort, no increased work of breathing. Skin: No rashes, bruises or suspicious lesions. Neurologic: Grossly intact, no focal deficits, moving all 4 extremities. Psychiatric: Normal mood and affect.  Pertinent Imaging:  CLINICAL DATA:  LEFT-sided lithotripsy last week, follow-up exam.  EXAM: ABDOMEN - 1 VIEW  COMPARISON:  Plain film of the abdomen  dated 06/09/2018.  FINDINGS: The 7 mm LEFT renal calculus demonstrated on earlier plain film is not seen today, compatible with the given history of interval lithotripsy. No evidence of renal or ureteral calculi on today's exam. Multiple vascular phleboliths again noted within the lower pelvis.  Bowel gas pattern is nonobstructive. Bowel sutures, hernia mesh, cholecystectomy clips, vertebroplasty changes and RIGHT hip arthroplasty again noted. No acute appearing osseous abnormality.  IMPRESSION: No radiographically visible renal or ureteral calculi on today's exam.   Electronically Signed   By: Franki Cabot M.D.   On: 06/23/2018 14:09 I have reviewed the KUB. KUB showed no stones present.   Assessment & Plan:    1. Nephrolithiasis  -Underwent ESWL on 06/09/2018 -Reports of passing stone, will bring stone in later in the week  -KUB today showed no stone present    Return in about 1 month (around 07/22/2018) for RUS report .  [facility] [address], [place], [place] [address] [phone]  I, Lucas Mallow, am acting as a Education administrator for [employer],  I have reviewed the above documentation for accuracy and completeness, and I agree with the above.    Zara Council, PA-C

## 2018-06-24 ENCOUNTER — Ambulatory Visit (INDEPENDENT_AMBULATORY_CARE_PROVIDER_SITE_OTHER): Payer: Medicare Other | Admitting: Family Medicine

## 2018-06-24 ENCOUNTER — Encounter: Payer: Self-pay | Admitting: Family Medicine

## 2018-06-24 VITALS — BP 121/73 | HR 99

## 2018-06-24 DIAGNOSIS — N390 Urinary tract infection, site not specified: Secondary | ICD-10-CM | POA: Diagnosis not present

## 2018-06-24 LAB — URINALYSIS, COMPLETE
Bilirubin, UA: NEGATIVE
Glucose, UA: NEGATIVE
Ketones, UA: NEGATIVE
Nitrite, UA: NEGATIVE
Protein, UA: NEGATIVE
RBC, UA: NEGATIVE
Specific Gravity, UA: 1.02 (ref 1.005–1.030)
Urobilinogen, Ur: 0.2 mg/dL (ref 0.2–1.0)
pH, UA: 5.5 (ref 5.0–7.5)

## 2018-06-24 LAB — MICROSCOPIC EXAMINATION: RBC, UA: NONE SEEN /hpf (ref 0–2)

## 2018-06-24 MED ORDER — CIPROFLOXACIN HCL 250 MG PO TABS: 250.0000 mg | ORAL_TABLET | Freq: Two times a day (BID) | ORAL | 0 refills | Status: DC

## 2018-06-24 NOTE — Progress Notes (Signed)
Patient presents today with dysuria. Her symptoms began after her visit in office yesterday. A urine was collect for UA, UCX. Patient states she was on Cipro post surgery this month. Larene Beach reviewed the UA in office today and Cipro was sent. We will call with UCX results.

## 2018-06-28 ENCOUNTER — Telehealth: Payer: Self-pay

## 2018-06-28 LAB — CULTURE, URINE COMPREHENSIVE

## 2018-06-28 NOTE — Telephone Encounter (Signed)
Advised patients husband per DPR of culture results. He states he will pass message on to patient.

## 2018-06-28 NOTE — Telephone Encounter (Signed)
-----   Message from Nori Riis, PA-C sent at 06/28/2018  1:58 PM EST ----- Please let [name] know that her urine culture was positive for infection.  The Cipro she was prescribed is the appropriate antibiotic.  I hope she is feeling better.  If not, she needs to come in for an appointment.

## 2018-07-11 ENCOUNTER — Other Ambulatory Visit: Payer: Self-pay

## 2018-07-11 ENCOUNTER — Ambulatory Visit
Admission: RE | Admit: 2018-07-11 | Discharge: 2018-07-11 | Disposition: A | Discharge status: Home / Self Care | Payer: Medicare Other | Source: Ambulatory Visit | Attending: Urology | Admitting: Urology

## 2018-07-11 ENCOUNTER — Telehealth: Payer: Self-pay | Admitting: Urology

## 2018-07-11 DIAGNOSIS — N200 Calculus of kidney: Secondary | ICD-10-CM | POA: Insufficient documentation

## 2018-07-11 NOTE — Telephone Encounter (Signed)
Pt having burning with urination, started Friday, pt started taking AZO states she has no relief, denies any other symptom, no pain, no fever/chills. Added to nurse schedule Thurs afternoon.

## 2018-07-14 ENCOUNTER — Ambulatory Visit: Payer: Medicare Other

## 2018-07-14 ENCOUNTER — Other Ambulatory Visit: Payer: Self-pay

## 2018-07-14 ENCOUNTER — Ambulatory Visit (INDEPENDENT_AMBULATORY_CARE_PROVIDER_SITE_OTHER): Payer: Medicare Other | Admitting: Urology

## 2018-07-14 ENCOUNTER — Encounter: Payer: Self-pay | Admitting: Urology

## 2018-07-14 VITALS — BP 128/76 | HR 87 | Ht <= 58 in | Wt 109.0 lb

## 2018-07-14 DIAGNOSIS — N200 Calculus of kidney: Secondary | ICD-10-CM

## 2018-07-14 DIAGNOSIS — R300 Dysuria: Secondary | ICD-10-CM

## 2018-07-14 LAB — URINALYSIS, COMPLETE
Bilirubin, UA: NEGATIVE
Glucose, UA: NEGATIVE
Ketones, UA: NEGATIVE
Nitrite, UA: NEGATIVE
Protein, UA: NEGATIVE
Specific Gravity, UA: 1.025 (ref 1.005–1.030)
Urobilinogen, Ur: 0.2 mg/dL (ref 0.2–1.0)
pH, UA: 6 (ref 5.0–7.5)

## 2018-07-14 LAB — MICROSCOPIC EXAMINATION
Bacteria, UA: NONE SEEN
WBC, UA: >30 /hpf — AB (ref 0–5)

## 2018-07-14 MED ORDER — CIPROFLOXACIN HCL 250 MG PO TABS: 250.0000 mg | ORAL_TABLET | Freq: Two times a day (BID) | ORAL | 0 refills | Status: DC

## 2018-07-14 NOTE — Progress Notes (Signed)
07/14/2018 9:15 AM   Kimberly Meyer 02-27-1946 [id number]  Referring provider: Marinda Elk, MD [facility] [place], [place] [address]  Chief Complaint  Patient presents with   Follow-up    HPI: Kimberly Meyer is a 73 y.o. female Caucasian with a history of nephrolithiasis who presents today to discuss the results of her follow up RUS.     RUS in 09/2017 with minimal left parenchymal thinning without hydronephrosis. There is a 9 mm left lower pole stone seen on renal ultrasound. This appears slightly larger than last year which is 5 mm.  RUS in 04/2018 with grossly stable 11-mm non-obstructing left renal calculus. No hydronephrosis or renal obstruction is noted. No other renal abnormality is noted.  KUB on 06/09/2018 showed a 7 mm left renal calculus. She underwent ESWL on 06/09/2018.   On 06/23/2018, she reported passing a stone after her ESWL.  She did not know she had to bring the stone in for a stone analysis; she was willing to bring the stone in later that week for analysis.  She denied gross hematuria or pain. Her KUB showed no stone present.  RUS on 07/12/2018 reported the impression by Dr. Jeannine Kitten of: 1. 9 mm nonobstructing left renal stone.  2. Minimal left renal parenchymal thinning.  3. No hydronephrosis.  Today (07/14/2018), she has lost the stone fragment so we are unable to send the stone for analysis.  She states that she started experiencing dysuria that began last Thursday or Friday.   She started on AZO and felt that it did ease her dysuria.  She did have a positive urine culture for enterococcus faecalis on 06/24/2018 for which Cipro was prescribed.  When she was asked if the Cipro had helped with this infection, she could not give a timeframe from when she completed the Cipro and when her symptoms returned.  Patient denies any gross hematuria, dysuria or suprapubic/flank pain.  Patient denies any fevers, chills, nausea or vomiting.    UA is positive for > 30 WBC's.    PMH: 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[facility])    Anemia    Anxiety    Chronic tension headache    Depression    GERD (gastroesophageal reflux disease)    History of pulmonary embolus (PE)    Hyperlipemia    Hypertension    IBS (irritable bowel syndrome)    Kidney stone    Low back pain    Situational stress     Surgical History: Past Surgical History:  Procedure Laterality Date   APPENDECTOMY     AUGMENTATION MAMMAPLASTY  1990's   BLADDER SURGERY     bladder tack   BREAST IMPLANT REMOVAL Left 07/15/2016   Procedure: REMOVAL OF EXPOSED LEFT BREAST IMPLANT;  Surgeon: Wallace Going, DO;  Location: [place];  Service: Plastics;  Laterality: Left;   CHOLECYSTECTOMY     COLONOSCOPY     COLONOSCOPY WITH PROPOFOL N/A 07/22/2015   Procedure: COLONOSCOPY WITH PROPOFOL;  Surgeon: Manya Silvas, MD;  Location: [facility] ENDOSCOPY;  Service: Endoscopy;  Laterality: N/A;   CYSTOSCOPY/URETEROSCOPY/HOLMIUM LASER/STENT PLACEMENT Left 11/13/2014   Procedure: CYSTOSCOPY/URETEROSCOPY/HOLMIUM LASER/STENT PLACEMENT;  Surgeon: Hollice Espy, MD;  Location: [facility] ORS;  Service: Urology;  Laterality: Left;   EXTRACORPOREAL SHOCK WAVE LITHOTRIPSY Left 06/09/2018   Procedure: EXTRACORPOREAL SHOCK WAVE LITHOTRIPSY (ESWL);  Surgeon: Abbie Sons, MD;  Location: [facility] ORS;  Service: Urology;  Laterality: Left;  HIP SURGERY     KYPHOPLASTY N/A 06/04/2015   Procedure: KYPHOPLASTY T11, T12;  Surgeon: Hessie Knows, MD;  Location: [facility] ORS;  Service: Orthopedics;  Laterality: N/A;   LITHOTRIPSY     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 Always use your most recent med list.        alendronate 70 MG tablet Commonly known as:  FOSAMAX   aspirin 81 MG tablet Take 81 mg by mouth daily.   AZO URINARY PAIN PO Take by mouth.   buPROPion 150 MG 12 hr tablet Commonly known as:  WELLBUTRIN SR Take by mouth.   ciprofloxacin 250 MG tablet Commonly known as:  Cipro Take 1 tablet (250 mg total) by mouth 2 (two) times daily.   esomeprazole 20 MG capsule Commonly known as:  NEXIUM Take 20 mg by mouth daily at 12 noon.   fluticasone 50 MCG/ACT nasal spray Commonly known as:  FLONASE Place into the nose.   HYDROcodone-acetaminophen 5-325 MG tablet Commonly known as:  NORCO/VICODIN Take 1 tablet by mouth every 4 (four) hours as needed for moderate pain.   levocetirizine 5 MG tablet Commonly known as:  XYZAL Take by mouth.   lisinopril 10 MG tablet Commonly known as:  PRINIVIL,ZESTRIL Take by mouth.   Multi-Vitamins Tabs Take by mouth.   niacin 500 MG tablet Take by mouth.   Omega 3 1000 MG Caps Take by mouth.   Potassium Citrate 15 MEQ (1620 MG) Tbcr Take 1 tablet by mouth 2 (two) times daily.   tamsulosin 0.4 MG Caps capsule Commonly known as:  FLOMAX Take 1 capsule (0.4 mg total) by mouth daily after breakfast.   Zenpep 20000-68000 units Cpep Generic drug:  Pancrelipase (Lip-Prot-Amyl) TAKE AS DIRECTED BEFORE MEALS AND SNACKS       Allergies:  Allergies  Allergen Reactions   Sulfa Antibiotics Other (See Comments)    Does not remember reaction-from childhood    Family History: Family History  Problem Relation Age of Onset   Alzheimer's disease Father    Kidney Stones Father    Stroke Mother    Diabetes type II Mother    Breast cancer Neg Hx     Social History:  reports that she has quit smoking. She has never used smokeless tobacco. She reports that she does not drink alcohol or use  drugs.  ROS: UROLOGY Frequent Urination?: No Hard to postpone urination?: No Burning/pain with urination?: Yes Get up at night to urinate?: No Leakage of urine?: No Urine stream starts and stops?: No Trouble starting stream?: No Do you have to strain to urinate?: No Blood in urine?: No Urinary tract infection?: No Sexually transmitted disease?: No Injury to kidneys or bladder?: No Painful intercourse?: No Weak stream?: No Currently pregnant?: No Vaginal bleeding?: No Last menstrual period?: n  Gastrointestinal Nausea?: No Vomiting?: No Indigestion/heartburn?: No Diarrhea?: No Constipation?: No  Constitutional Fever: No Night sweats?: No Weight loss?: No Fatigue?: Yes  Skin Skin rash/lesions?: No Itching?: No  Eyes Blurred vision?: No Double vision?:  No  Ears/Nose/Throat Sore throat?: No Sinus problems?: No  Hematologic/Lymphatic Swollen glands?: No Easy bruising?: No  Cardiovascular Leg swelling?: No Chest pain?: No  Respiratory Cough?: No Shortness of breath?: No  Endocrine Excessive thirst?: No  Musculoskeletal Back pain?: No Joint pain?: No  Neurological Headaches?: No Dizziness?: No  Psychologic Depression?: No Anxiety?: No  Physical Exam: BP 128/76    Pulse 87    Ht 4\' 10"  (1.473 m)    Wt 109 lb (49.4 kg)    BMI 22.78 kg/m   Constitutional:  Well nourished. Alert and oriented, No acute distress. HEENT: [place] AT, moist mucus membranes.  Trachea midline, no masses. Cardiovascular: No clubbing, cyanosis, or edema. Respiratory: Normal respiratory effort, no increased work of breathing. Neurologic: Grossly intact, no focal deficits, moving all 4 extremities. Psychiatric: Normal mood and affect.  Laboratory Data:  Urinalysis Cloudy, BLO trace-intact, LEU 2+, >30 WBC.  See Epic.  I have reviewed the labs.  Pertinent Imaging:  Results for orders placed during the hospital encounter of 07/11/18  US RENAL   Narrative CLINICAL  DATA:  73 year old female with left-sided kidney stones. Subsequent encounter.  EXAM: RENAL / URINARY TRACT ULTRASOUND COMPLETE  COMPARISON:  06/23/2018 plain film exam. 04/28/2018 renal sonogram. 05/03/2015 CT.  FINDINGS: Right Kidney:  Renal measurements: 10 x 4 x 3.5 cm = volume: 74 mL . Echogenicity within normal limits. No mass or hydronephrosis visualized.  Left Kidney:  Renal measurements: 9.6 x 3.5 x 4.3 cm = volume: 75.9 mL. Minimal renal parenchymal thinning. No hydronephrosis. 9 mm nonobstructing stone mid to lower aspect.  Bladder:  Appears normal for degree of bladder distention.  IMPRESSION: 1. 9 mm nonobstructing left renal stone. 2. Minimal left renal parenchymal thinning. 3. No hydronephrosis.   Electronically Signed   By: Genia Del M.D.   On: 07/12/2018 08:14    I have independently reviewed the films and do not note hydronephrosis.    Assessment & Plan:    1. Nephrolithiasis  - Underwent ESWL on 06/09/2018 - Reports of passing stone; was unable to bring stone for analysis - KUB on 06/23/2018 showed no stone present  - RUS demonstrates a 9 mm left renal stone   2. rUTI - UA is suspicious for infection  - Patient's urine was sent out for culture - Patient started on Cipro 250 mg BID  Return for pending urine culture results .  Zara Council, PA-C  [facility] [address] [place], [place] [address] [phone]  I, Adele Schilder, am acting as a Education administrator for [employer], PA-C.   I have reviewed the above documentation for accuracy and completeness, and I agree with the above.    Zara Council, PA-C

## 2018-07-18 LAB — CULTURE, URINE COMPREHENSIVE

## 2018-07-19 ENCOUNTER — Telehealth: Payer: Self-pay | Admitting: Family Medicine

## 2018-07-19 NOTE — Telephone Encounter (Signed)
Patient notified and voiced understanding.

## 2018-07-19 NOTE — Telephone Encounter (Signed)
-----   Message from Nori Riis, PA-C sent at 07/18/2018  4:38 PM EDT ----- Please let Kimberly Meyer know that her urine culture was positive for infection and that Cipro is the appropriate antibiotic.

## 2018-07-22 ENCOUNTER — Ambulatory Visit: Payer: Medicare Other | Admitting: Urology

## 2018-07-22 ENCOUNTER — Telehealth: Payer: Self-pay | Admitting: Urology

## 2018-07-22 DIAGNOSIS — N200 Calculus of kidney: Secondary | ICD-10-CM

## 2018-07-22 NOTE — Telephone Encounter (Signed)
Please have Kimberly Meyer follow up in 6 months with a RUS for stones and an office visit.  Orders are in chart for RUS.

## 2018-07-28 ENCOUNTER — Telehealth: Payer: Self-pay | Admitting: Urology

## 2018-07-28 NOTE — Telephone Encounter (Signed)
Patient notified will come in on Monday , due to covid no provider on Friday. She was encouraged to take AZO and increase water intake to help with the burning

## 2018-07-28 NOTE — Telephone Encounter (Signed)
Pt is having burning w/urination that started yesterday.  No other symptoms.  Please advise.

## 2018-07-28 NOTE — Telephone Encounter (Signed)
She will have to come in for a CATH UA and culture.

## 2018-08-01 ENCOUNTER — Ambulatory Visit (INDEPENDENT_AMBULATORY_CARE_PROVIDER_SITE_OTHER): Payer: Medicare Other

## 2018-08-01 ENCOUNTER — Other Ambulatory Visit: Payer: Self-pay

## 2018-08-01 DIAGNOSIS — R300 Dysuria: Secondary | ICD-10-CM

## 2018-08-01 DIAGNOSIS — N390 Urinary tract infection, site not specified: Secondary | ICD-10-CM

## 2018-08-01 LAB — URINALYSIS, COMPLETE
Bilirubin, UA: NEGATIVE
Glucose, UA: NEGATIVE
Ketones, UA: NEGATIVE
Nitrite, UA: NEGATIVE
Protein,UA: NEGATIVE
RBC, UA: NEGATIVE
Specific Gravity, UA: 1.025 (ref 1.005–1.030)
Urobilinogen, Ur: 0.2 mg/dL (ref 0.2–1.0)
pH, UA: 6.5 (ref 5.0–7.5)

## 2018-08-01 LAB — MICROSCOPIC EXAMINATION
Bacteria, UA: NONE SEEN
RBC, Urine: NONE SEEN /hpf (ref 0–2)

## 2018-08-01 NOTE — Progress Notes (Signed)
In and Out Catheterization  Patient is present today for a I & O catheterization due to recurrent UTI. Patient was cleaned and prepped in a sterile fashion with betadine.  A 14FR cath was inserted no complications were noted , 179ml of urine return was noted, urine was dark yellow in color. A clean urine sample was collected for UA and CX. Bladder was drained  And catheter was removed with out difficulty.    Preformed by: Gordy Clement, CMA (AAMA)  Follow up/ Additional notes: Will call with results

## 2018-08-04 ENCOUNTER — Telehealth: Payer: Self-pay

## 2018-08-04 LAB — CULTURE, URINE COMPREHENSIVE

## 2018-08-04 MED ORDER — NITROFURANTOIN MONOHYD MACRO 100 MG PO CAPS: 100.0000 mg | ORAL_CAPSULE | Freq: Two times a day (BID) | ORAL | 0 refills | Status: DC

## 2018-08-04 NOTE — Telephone Encounter (Signed)
-----   Message from Nori Riis, PA-C sent at 08/04/2018  9:42 AM EDT ----- Please let Kimberly Meyer know that her urine culture is positive for infection.  She needs to start Macrobid 100 mg, BID x seven days.

## 2018-08-04 NOTE — Telephone Encounter (Signed)
Patient's husband notified , script sent in

## 2018-10-30 ENCOUNTER — Other Ambulatory Visit: Payer: Self-pay

## 2018-10-30 DIAGNOSIS — R112 Nausea with vomiting, unspecified: Secondary | ICD-10-CM | POA: Diagnosis not present

## 2018-10-30 DIAGNOSIS — R339 Retention of urine, unspecified: Secondary | ICD-10-CM | POA: Insufficient documentation

## 2018-10-30 DIAGNOSIS — Z87891 Personal history of nicotine dependence: Secondary | ICD-10-CM | POA: Diagnosis not present

## 2018-10-30 DIAGNOSIS — I10 Essential (primary) hypertension: Secondary | ICD-10-CM | POA: Insufficient documentation

## 2018-10-30 DIAGNOSIS — R109 Unspecified abdominal pain: Secondary | ICD-10-CM | POA: Diagnosis present

## 2018-10-30 DIAGNOSIS — N201 Calculus of ureter: Secondary | ICD-10-CM | POA: Diagnosis not present

## 2018-10-30 DIAGNOSIS — Z79899 Other long term (current) drug therapy: Secondary | ICD-10-CM | POA: Insufficient documentation

## 2018-10-30 LAB — COMPREHENSIVE METABOLIC PANEL
ALT: 31 U/L (ref 0–44)
AST: 24 U/L (ref 15–41)
Albumin: 4.3 g/dL (ref 3.5–5.0)
Alkaline Phosphatase: 90 U/L (ref 38–126)
Anion gap: 9 (ref 5–15)
BUN: 23 mg/dL (ref 8–23)
CO2: 26 mmol/L (ref 22–32)
Calcium: 9.2 mg/dL (ref 8.9–10.3)
Chloride: 103 mmol/L (ref 98–111)
Creatinine, Ser: 0.98 mg/dL (ref 0.44–1.00)
GFR calc Af Amer: >60 mL/min (ref >60)
GFR calc non Af Amer: 57 mL/min — ABNORMAL LOW (ref >60)
Glucose, Bld: 169 mg/dL — ABNORMAL HIGH (ref 70–99)
Potassium: 4 mmol/L (ref 3.5–5.1)
Sodium: 138 mmol/L (ref 135–145)
Total Bilirubin: 0.4 mg/dL (ref 0.3–1.2)
Total Protein: 7 g/dL (ref 6.5–8.1)

## 2018-10-30 LAB — CBC WITH DIFFERENTIAL/PLATELET
Abs Immature Granulocytes: 0.07 10*3/uL (ref 0.00–0.07)
Basophils Absolute: 0.1 10*3/uL (ref 0.0–0.1)
Basophils Relative: 1 %
Eosinophils Absolute: 0.3 10*3/uL (ref 0.0–0.5)
Eosinophils Relative: 2 %
HCT: 42.5 % (ref 36.0–46.0)
Hemoglobin: 14.2 g/dL (ref 12.0–15.0)
Immature Granulocytes: 1 %
Lymphocytes Relative: 18 %
Lymphs Abs: 2.3 10*3/uL (ref 0.7–4.0)
MCH: 30.9 pg (ref 26.0–34.0)
MCHC: 33.4 g/dL (ref 30.0–36.0)
MCV: 92.6 fL (ref 80.0–100.0)
Monocytes Absolute: 0.9 10*3/uL (ref 0.1–1.0)
Monocytes Relative: 7 %
Neutro Abs: 9.1 10*3/uL — ABNORMAL HIGH (ref 1.7–7.7)
Neutrophils Relative %: 71 %
Platelets: 218 10*3/uL (ref 150–400)
RBC: 4.59 MIL/uL (ref 3.87–5.11)
RDW: 12.7 % (ref 11.5–15.5)
WBC: 12.7 10*3/uL — ABNORMAL HIGH (ref 4.0–10.5)
nRBC: 0 % (ref 0.0–0.2)

## 2018-10-30 LAB — URINALYSIS, COMPLETE (UACMP) WITH MICROSCOPIC
Bilirubin Urine: NEGATIVE
Glucose, UA: NEGATIVE mg/dL
Ketones, ur: NEGATIVE mg/dL
Nitrite: NEGATIVE
Protein, ur: 30 mg/dL — AB
RBC / HPF: >50 RBC/hpf — ABNORMAL HIGH (ref 0–5)
Specific Gravity, Urine: 1.017 (ref 1.005–1.030)
WBC, UA: >50 WBC/hpf — ABNORMAL HIGH (ref 0–5)
pH: 5 (ref 5.0–8.0)

## 2018-10-30 NOTE — ED Triage Notes (Signed)
Patient reports earlier today with left sided abdominal pain and states now she can't urinate.  Reports last urinated sometime int he afternoon.

## 2018-10-30 NOTE — ED Notes (Signed)
Bladder scan=77ml.

## 2018-10-31 ENCOUNTER — Telehealth: Payer: Self-pay | Admitting: Urology

## 2018-10-31 ENCOUNTER — Emergency Department
Admission: EM | Admit: 2018-10-31 | Discharge: 2018-10-31 | Disposition: A | Discharge status: Home / Self Care | Payer: Medicare Other | Attending: Emergency Medicine | Admitting: Emergency Medicine

## 2018-10-31 ENCOUNTER — Emergency Department: Payer: Medicare Other

## 2018-10-31 DIAGNOSIS — N201 Calculus of ureter: Secondary | ICD-10-CM | POA: Diagnosis not present

## 2018-10-31 DIAGNOSIS — R109 Unspecified abdominal pain: Secondary | ICD-10-CM

## 2018-10-31 MED ORDER — ONDANSETRON 4 MG PO TBDP: ORAL_TABLET | ORAL | 0 refills | Status: DC

## 2018-10-31 MED ORDER — TAMSULOSIN HCL 0.4 MG PO CAPS: ORAL_CAPSULE | ORAL | 0 refills | Status: DC

## 2018-10-31 MED ORDER — DOCUSATE SODIUM 100 MG PO CAPS: ORAL_CAPSULE | ORAL | 0 refills | Status: DC

## 2018-10-31 MED ORDER — OXYCODONE-ACETAMINOPHEN 5-325 MG PO TABS: 2.0000 | ORAL_TABLET | Freq: Four times a day (QID) | ORAL | 0 refills | Status: DC | PRN

## 2018-10-31 MED ORDER — CEPHALEXIN 500 MG PO CAPS: 500.0000 mg | ORAL_CAPSULE | Freq: Four times a day (QID) | ORAL | 0 refills | Status: AC

## 2018-10-31 MED ORDER — CEPHALEXIN 500 MG PO CAPS
500.0000 mg | ORAL_CAPSULE | Freq: Once | ORAL | Status: AC
  Administered 2018-10-31: 500 mg via ORAL
  Filled 2018-10-31: qty 1

## 2018-10-31 NOTE — Discharge Instructions (Signed)
You have been seen in the Emergency Department (ED) today for pain that we believe based on your workup, is caused by kidney stones.  As we have discussed, please drink plenty of fluids.  Please make a follow up appointment with the physician(s) listed elsewhere in this documentation.  It is also very important that you take the antibiotics as prescribed because at this point we cannot be certain that you do not have a urinary tract infection, and it is very important to treat urinary tract infections particularly when they are present in the setting of a kidney/ureteral stone.  You may take pain medication as needed but ONLY as prescribed.  Please also take your prescribed Flomax daily.  We also recommend that you take over-the-counter ibuprofen regularly according to label instructions over the next 5 days.  Take it with meals to minimize stomach discomfort.  Please see your doctor as soon as possible as stones may take 1-3 weeks to pass and you may require additional care or medications.  Do not drink alcohol, drive or participate in any other potentially dangerous activities while taking opiate pain medication as it may make you sleepy. Do not take this medication with any other sedating medications, either prescription or over-the-counter. If you were prescribed Percocet or Vicodin, do not take these with acetaminophen (Tylenol) as it is already contained within these medications.   Take Percocet as needed for severe pain.  This medication is an opiate (or narcotic) pain medication and can be habit forming.  Use it as little as possible to achieve adequate pain control.  Do not use or use it with extreme caution if you have a history of opiate abuse or dependence.  If you are on a pain contract with your primary care doctor or a pain specialist, be sure to let them know you were prescribed this medication today from the [facility] Emergency Department.  This medication is intended for your use  only - do not give any to anyone else and keep it in a secure place where nobody else, especially children, have access to it.  It will also cause or worsen constipation, so you may want to consider taking an over-the-counter stool softener while you are taking this medication.  Return to the Emergency Department (ED) or call your doctor if you have any worsening pain, fever, painful urination, are unable to urinate, or develop other symptoms that concern you.

## 2018-10-31 NOTE — Telephone Encounter (Signed)
Pt called and states that she went to the ER for kidney stone on 10/30/2018 and wants to know if she should be seen prior to her next scheduled appt in September. Please advise.

## 2018-10-31 NOTE — Telephone Encounter (Signed)
Kimberly Meyer should see someone within the next two weeks.

## 2018-10-31 NOTE — ED Provider Notes (Signed)
[facility] Emergency Department Provider Note  ____________________________________________   First MD Initiated Contact with Patient 10/31/18 0147     (approximate)  I have reviewed the triage vital signs and the nursing notes.   HISTORY  Chief Complaint Abdominal Pain and Urinary Retention    HPI Kimberly Meyer is a 73 y.o. female with medical history as listed below which notably includes recurrent kidney stones and she has been under the care of Dr. Hollice Espy in the past.  She presents tonight for evaluation of acute onset left-sided abdominal and flank pain as well as nausea, vomiting, and an inability to urinate.  She reports that the onset of the pain was acute, the pain was severe and sharp, nothing in particular made it better or worse.  She was able to urinate twice since coming to the emergency department and since waiting almost 4 hours to get to her room, however, and by the time I saw her she reports that her pain is completely gone and she is ready to go home.  She has only required urological intervention once when she had lithotripsy earlier this year and she has never had a stent placement.  Recently she denies any fever, chills, sore throat, cough, shortness of breath, or chest pain.  She only had some burning dysuria right at the time that she was in the most discomfort and she has not been having any symptoms of urinary tract infection recently as per her own report.         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[place]) 06/07/2015   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   Chronic tension-type headache, intractable 11/07/2014   Acid reflux 11/07/2014   HLD (hyperlipidemia) 11/07/2014   BP (high blood pressure) 11/07/2014   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   Calculus of kidney 11/07/2014   PE (pulmonary embolism) 11/07/2014   Functional bowel disorder 11/07/2014   History of artificial joint 03/06/2014   Acute situational disturbance 02/21/2014   Pulmonary embolism ([place]) 02/27/2009    Past Surgical History:  Procedure Laterality Date   APPENDECTOMY     AUGMENTATION MAMMAPLASTY  1990's   BLADDER SURGERY     bladder tack   BREAST IMPLANT REMOVAL Left 07/15/2016   Procedure: REMOVAL OF EXPOSED LEFT BREAST IMPLANT;  Surgeon: Wallace Going, DO;  Location: [facility];  Service: Plastics;  Laterality: Left;   CHOLECYSTECTOMY     COLONOSCOPY     COLONOSCOPY WITH PROPOFOL N/A 07/22/2015   Procedure: COLONOSCOPY WITH PROPOFOL;  Surgeon: Manya Silvas, MD;  Location: [facility] ENDOSCOPY;  Service: Endoscopy;  Laterality: N/A;   CYSTOSCOPY/URETEROSCOPY/HOLMIUM LASER/STENT PLACEMENT Left 11/13/2014   Procedure: CYSTOSCOPY/URETEROSCOPY/HOLMIUM LASER/STENT PLACEMENT;  Surgeon: Hollice Espy, MD;  Location: [facility] ORS;  Service: Urology;  Laterality: Left;  EXTRACORPOREAL SHOCK WAVE LITHOTRIPSY Left 06/09/2018   Procedure: EXTRACORPOREAL SHOCK WAVE LITHOTRIPSY (ESWL);  Surgeon: Abbie Sons, MD;  Location: [facility] ORS;  Service: Urology;  Laterality: Left;   HIP SURGERY     KYPHOPLASTY N/A 06/04/2015   Procedure: KYPHOPLASTY T11, T12;  Surgeon: Hessie Knows, MD;  Location: [facility] ORS;  Service: Orthopedics;  Laterality: N/A;   LITHOTRIPSY     PANCREATICODUODENECTOMY     secondary  to ampullary carcinoma   PLACEMENT OF BREAST IMPLANTS     TUBAL LIGATION     VENTRAL HERNIA REPAIR     WHIPPLE PROCEDURE      Prior to Admission medications   Medication Sig Start Date End Date Taking? Authorizing Provider  alendronate (FOSAMAX) 70 MG tablet  06/20/18   [provider]  aspirin 81 MG tablet Take 81 mg by mouth daily.    [provider]  buPROPion (WELLBUTRIN SR) 150 MG 12 hr tablet Take by mouth. 09/27/14   [provider]  cephALEXin (KEFLEX) 500 MG capsule Take 1 capsule (500 mg total) by mouth 4 (four) times daily for 12 days. 10/31/18 11/12/18  Hinda Kehr, MD  ciprofloxacin (CIPRO) 250 MG tablet Take 1 tablet (250 mg total) by mouth 2 (two) times daily. 07/14/18   Zara Council A, PA-C  docusate sodium (COLACE) 100 MG capsule Take 1 tablet once or twice daily as needed for constipation while taking narcotic pain medicine 10/31/18   Hinda Kehr, MD  esomeprazole (NEXIUM) 20 MG capsule Take 20 mg by mouth daily at 12 noon.    [provider]  fluticasone (FLONASE) 50 MCG/ACT nasal spray Place into the nose. 09/27/14   [provider]  HYDROcodone-acetaminophen (NORCO/VICODIN) 5-325 MG tablet Take 1 tablet by mouth every 4 (four) hours as needed for moderate pain. 06/09/18   Stoioff, Ronda Fairly, MD  levocetirizine (XYZAL) 5 MG tablet Take by mouth.    [provider]  lisinopril (PRINIVIL,ZESTRIL) 10 MG tablet Take by mouth. 09/27/14   [provider]  Multiple Vitamin (MULTI-VITAMINS) TABS Take by mouth.    [provider]  niacin 500 MG tablet Take by mouth.    [provider]  nitrofurantoin, macrocrystal-monohydrate, (MACROBID) 100 MG capsule Take 1 capsule (100 mg total) by mouth every 12 (twelve) hours. 08/04/18   McGowan, Larene Beach A, PA-C  Omega 3 1000 MG CAPS Take by mouth.    [provider]  ondansetron (ZOFRAN ODT) 4 MG disintegrating tablet Allow 1-2 tablets to dissolve in your  mouth every 8 hours as needed for nausea/vomiting 10/31/18   Hinda Kehr, MD  oxyCODONE-acetaminophen (PERCOCET) 5-325 MG tablet Take 2 tablets by mouth every 6 (six) hours as needed for severe pain. 10/31/18   Hinda Kehr, MD  Pancrelipase, Lip-Prot-Amyl, (ZENPEP) 20000 UNITS CPEP TAKE AS DIRECTED BEFORE MEALS AND SNACKS 09/27/14   [provider]  Phenazopyridine HCl (AZO URINARY PAIN PO) Take by mouth.    [provider]  Potassium Citrate 15 MEQ (1620 MG) TBCR Take 1 tablet by mouth 2 (two) times daily. 04/28/18   Hollice Espy, MD  tamsulosin [facility]) 0.4 MG CAPS capsule Take 1 tablet by mouth daily until you pass the kidney stone or no longer have symptoms 10/31/18   Hinda Kehr, MD    Allergies Sulfa antibiotics  Family History  Problem Relation Age of Onset   Alzheimer's disease Father    Kidney Stones Father    Stroke Mother    Diabetes type II Mother  Breast cancer Neg Hx     Social History Social History   Tobacco Use   Smoking status: Former Smoker   Smokeless tobacco: Never Used  Substance Use Topics   Alcohol use: No    Alcohol/week: 0.0 standard drinks   Drug use: No    Review of Systems Constitutional: No fever/chills Eyes: No visual changes. ENT: No sore throat. Cardiovascular: Denies chest pain. Respiratory: Denies shortness of breath. Gastrointestinal: Left-sided flank and abdominal pain is with associated nausea and vomiting as described above. Genitourinary: Difficulty urinating.  Some mild dysuria while having left-sided pain. Musculoskeletal: Negative for neck pain.  Left-sided flank pain. Integumentary: Negative for rash. Neurological: Negative for headaches, focal weakness or numbness.   ____________________________________________   PHYSICAL EXAM:  VITAL SIGNS: ED Triage Vitals  Enc Vitals Group     BP 10/30/18 2221 134/70     Pulse Rate 10/30/18 2221 73     Resp 10/30/18 2221 17     Temp 10/30/18 2221 97.6  F (36.4 C)     Temp Source 10/30/18 2221 Oral     SpO2 10/30/18 2221 98 %     Weight 10/30/18 2222 48.5 kg (107 lb)     Height 10/30/18 2222 1.473 m (4\' 10" )     Head Circumference --      [place] Flow --      Pain Score 10/30/18 2221 3     Pain Loc --      Pain Edu? --      Excl. in [place]? --     Constitutional: Alert and oriented. Well appearing and in no acute distress. Eyes: Conjunctivae are normal.  Head: Atraumatic. Nose: No congestion/rhinnorhea. Mouth/Throat: Mucous membranes are moist. Neck: No stridor.  No meningeal signs.   Cardiovascular: Normal rate, regular rhythm. Good peripheral circulation. Grossly normal heart sounds. Respiratory: Normal respiratory effort.  No retractions. No audible wheezing. Gastrointestinal: Soft and nontender. No distention.  Musculoskeletal: No left-sided CVA tenderness to percussion.  No lower extremity tenderness nor edema. No gross deformities of extremities. Neurologic:  Normal speech and language. No gross focal neurologic deficits are appreciated.  Skin:  Skin is warm, dry and intact. No rash noted. Psychiatric: Mood and affect are normal. Speech and behavior are normal.  ____________________________________________   LABS (all labs ordered are listed, but only abnormal results are displayed)  Labs Reviewed  CBC WITH DIFFERENTIAL/PLATELET - Abnormal; Notable for the following components:      Result Value   WBC 12.7 (*)    Neutro Abs 9.1 (*)    All other components within normal limits  COMPREHENSIVE METABOLIC PANEL - Abnormal; Notable for the following components:   Glucose, Bld 169 (*)    GFR calc non Af Amer 57 (*)    All other components within normal limits  URINALYSIS, COMPLETE (UACMP) WITH MICROSCOPIC - Abnormal; Notable for the following components:   Color, Urine YELLOW (*)    APPearance CLOUDY (*)    Hgb urine dipstick LARGE (*)    Protein, ur 30 (*)    Leukocytes,Ua LARGE (*)    RBC / HPF >50 (*)    WBC, UA >50 (*)     Bacteria, UA RARE (*)    All other components within normal limits  URINE CULTURE   ____________________________________________  EKG  No indication for EKG ____________________________________________  RADIOLOGY   ED MD interpretation: Obstructing 3 x 4 mm stone in the distal right ureter with some mild left perinephric stranding and a  3 mm nonobstructing stone in the left kidney.  Official radiology report(s): Ct Renal Stone Study  Result Date: 10/31/2018 CLINICAL DATA:  Left-sided abdominal pain. Patient reports she cannot urinate. History of renal stones. EXAM: CT ABDOMEN AND PELVIS WITHOUT CONTRAST TECHNIQUE: Multidetector CT imaging of the abdomen and pelvis was performed following the standard protocol without IV contrast. COMPARISON:  Renal ultrasound 07/01/2018. most recent CT 05/03/2015 FINDINGS: Lower chest: Calcified granuloma in the right lower lobe. Minor right lower lobe atelectasis or scarring. Hepatobiliary: Elongated left lobe of the liver. Suggestion of slight capsular nodularity. No focal hepatic abnormality. No common bile duct dilatation. Post cholecystectomy. Pancreas: Postsurgical change of the proximal pancreas. Distal body and tail are intact. Spleen: Normal in size without focal abnormality. Adrenals/Urinary Tract: Mild bilateral adrenal thickening without dominant nodule. Obstructing 3 x 4 mm stone in the distal right ureter just proximal to the ureterovesicular junction with moderate to severe hydroureteronephrosis. Mild left perinephric edema. 3 mm nonobstructing stone in the left kidney. No right hydronephrosis. No right renal stone. Right ureter is decompressed without stone along the course. Urinary bladder is partially distended, no bladder wall thickening or obvious bladder stone. Streak artifact from right hip arthroplasty partially obscures evaluation. Stomach/Bowel: Ingested material in the stomach. Postsurgical change of the stomach with multiple suture  lines in presumed gastrojejunostomy. No small bowel dilatation, inflammation, or obstruction. Large colonic stool burden. Significant colonic redundancy. No colonic inflammation. Minor left colonic diverticulosis without diverticulitis. Vascular/Lymphatic: Aortic atherosclerosis. No aneurysm. No enlarged lymph nodes in the abdomen or pelvis. Reproductive: Atrophic uterus with single calcification. No adnexal mass. Other: No ascites. No free air. Postsurgical change of the anterior abdominal wall. Musculoskeletal: Moderate L4 superior endplate compression fracture, new from 2017 but age indeterminate. Chronic T11 and T12 compression fractures with vertebral augmentation. Prominent Schmorl's node superior endplate of L5 is unchanged. Right hip arthroplasty. There are no acute or suspicious osseous abnormalities. IMPRESSION: 1. Obstructing 3 x 4 mm stone in the distal left ureter just proximal to the ureterovesicular junction with moderate to severe hydroureteronephrosis. 2. Nonobstructing stone in the left kidney. 3. Large colonic stool burden with colonic redundancy, suggesting constipation. 4. Moderate L4 superior endplate compression fracture, new from 2017 but age indeterminate. 5.  Aortic Atherosclerosis (ICD10-I70.0). Electronically Signed   By: Keith Rake M.D.   On: 10/31/2018 01:48    ____________________________________________   PROCEDURES   Procedure(s) performed (including Critical Care):  Procedures   ____________________________________________   INITIAL IMPRESSION / MDM / [place] / ED COURSE  As part of my medical decision making, I reviewed the following data within the [place] notes reviewed and incorporated, Labs reviewed , Old chart reviewed, Notes from prior ED visits and [place] Controlled Substance Database        Differential diagnosis includes, but is not limited to, UTI/pyelonephritis, ureteral/renal stone, infected obstructing  stone, musculoskeletal pain, renal infarction.  The patient has hematuria but the urinalysis is nitrite negative.  Her symptoms have completely resolved and she is walking around the ED and wanting to go home.  She has a mild leukocytosis but has had no infectious signs or symptoms.  Her onset was acute and may have represented the initial movement of the kidney stone.  She has an obstructing stone on the right but her pain was actually on the left.  I discussed with her the severity of having an infected obstructing stone but she is completely asymptomatic and had not been having  any signs or symptoms of UTI prior to the onset of the pain tonight.  She is comfortable with the plan for discharge and outpatient follow-up with Dr. Erlene Quan and I feel this is appropriate given her stable vital signs, lack of fever, reassuring physical exam, and complete resolution of her symptoms.  However I gave her strict return precautions and gave her a first dose of Keflex 500 mg by mouth and a prescription for pyelonephritis dosing of Keflex to get filled in the morning.  I also sent a message through [facility] to Dr. Erlene Quan to make her aware of the case.  The patient understands and agrees with the plan.  I also provided prescriptions as listed below for standard ureteral stone care.      ____________________________________________  FINAL CLINICAL IMPRESSION(S) / ED DIAGNOSES  Final diagnoses:  Left ureteral stone  Acute left flank pain     MEDICATIONS GIVEN DURING THIS VISIT:  Medications  cephALEXin (KEFLEX) capsule 500 mg (500 mg Oral Given 10/31/18 0233)     ED Discharge Orders         Ordered    oxyCODONE-acetaminophen (PERCOCET) 5-325 MG tablet  Every 6 hours PRN     10/31/18 0237    ondansetron (ZOFRAN ODT) 4 MG disintegrating tablet     10/31/18 0237    tamsulosin (FLOMAX) 0.4 MG CAPS capsule     10/31/18 0237    docusate sodium (COLACE) 100 MG capsule     10/31/18 0237    cephALEXin (KEFLEX)  500 MG capsule  4 times daily     10/31/18 0240          *Please note:  LAURANN MCMORRIS was evaluated in Emergency Department on 10/31/2018 for the symptoms described in the history of present illness. She was evaluated in the context of the global COVID-19 pandemic, which necessitated consideration that the patient might be at risk for infection with the SARS-CoV-2 virus that causes COVID-19. Institutional protocols and algorithms that pertain to the evaluation of patients at risk for COVID-19 are in a state of rapid change based on information released by regulatory bodies including the CDC and federal and state organizations. These policies and algorithms were followed during the patient's care in the ED.  Some ED evaluations and interventions may be delayed as a result of limited staffing during the pandemic.*  Note:  This document was prepared using Dragon voice recognition software and may include unintentional dictation errors.   Hinda Kehr, MD 10/31/18 [phone]

## 2018-11-02 ENCOUNTER — Other Ambulatory Visit: Payer: Self-pay

## 2018-11-02 ENCOUNTER — Ambulatory Visit (INDEPENDENT_AMBULATORY_CARE_PROVIDER_SITE_OTHER): Payer: Medicare Other | Admitting: Urology

## 2018-11-02 VITALS — BP 110/70 | HR 109 | Ht <= 58 in | Wt 107.0 lb

## 2018-11-02 DIAGNOSIS — N201 Calculus of ureter: Secondary | ICD-10-CM

## 2018-11-02 DIAGNOSIS — R300 Dysuria: Secondary | ICD-10-CM | POA: Diagnosis not present

## 2018-11-02 DIAGNOSIS — N390 Urinary tract infection, site not specified: Secondary | ICD-10-CM | POA: Diagnosis not present

## 2018-11-02 LAB — URINALYSIS, COMPLETE
Bilirubin, UA: NEGATIVE
Glucose, UA: NEGATIVE
Ketones, UA: NEGATIVE
Nitrite, UA: NEGATIVE
Protein,UA: NEGATIVE
Specific Gravity, UA: 1.025 (ref 1.005–1.030)
Urobilinogen, Ur: 0.2 mg/dL (ref 0.2–1.0)
pH, UA: 5.5 (ref 5.0–7.5)

## 2018-11-02 LAB — URINE CULTURE
Culture: >=100000 — AB
Special Requests: NORMAL

## 2018-11-02 LAB — MICROSCOPIC EXAMINATION

## 2018-11-02 MED ORDER — CIPROFLOXACIN HCL 500 MG PO TABS: 500.0000 mg | ORAL_TABLET | Freq: Two times a day (BID) | ORAL | 0 refills | Status: DC

## 2018-11-02 NOTE — Progress Notes (Signed)
11/02/2018 2:54 PM   [name] 1945-05-21 [id number]  Referring provider: Marinda Elk, MD [facility],  [place] [address]  Chief Complaint  Patient presents with  . Flank Pain    HPI: Kimberly Meyer is a 73 y.o. female with PMH nephrolithiasis with chronic mild left hydronephrosis who presents today for ED follow-up.  Patient presented to [facility] ED on 10/30/2018 with sudden onset left-sided abdominal and flank pain with associated nausea and vomiting. She also reported an inability to urinate that resolved while in the ED.   CT stone study at the time demonstrated an obstructing left UVJ stone, 3x64mm in size, with moderate to severate hydroureteronephrosis on the ipsilateral side. Creatinine was 0.98; WBC were 12.7. Patient remained hemodynamically stable and afebrile throughout her stay. UA was significant for >50 RBCs and WBCs/HPF with rare bacteria. Subsequent urine culture was positive for >100k CFUs E faecalis.   Of note, patient has a PMH of E faecalis UTIs which do not show a pattern of antibiotic resistance. Patient was discharged home on a course of cephalexin for her apparent infection.  Today, patient denies pain and reports she has not vomited since the day of her initial presentation. She does not believe she has passed the stone yet. UA in clinic today is reassuring for persistent UTI.  Notably, she has not had any pain since her ER visit.  PMH: Past Medical History:  Diagnosis Date  . 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 Anemia   . Anxiety   . Chronic tension headache   . Depression   . GERD (gastroesophageal reflux disease)   . History of pulmonary embolus (PE)   . Hyperlipemia   . Hypertension   . IBS (irritable bowel syndrome)   . Kidney stone   . Low back pain   . Situational stress     Surgical History: Past Surgical History:  Procedure Laterality Date  . APPENDECTOMY    . AUGMENTATION MAMMAPLASTY  1990's  .  BLADDER SURGERY     bladder tack  . BREAST IMPLANT REMOVAL Left 07/15/2016   Procedure: REMOVAL OF EXPOSED LEFT BREAST IMPLANT;  Surgeon: Wallace Going, DO;  Location: [place];  Service: Plastics;  Laterality: Left;  . CHOLECYSTECTOMY    . COLONOSCOPY    . COLONOSCOPY WITH PROPOFOL N/A 07/22/2015   Procedure: COLONOSCOPY WITH PROPOFOL;  Surgeon: Manya Silvas, MD;  Location: [facility] ENDOSCOPY;  Service: Endoscopy;  Laterality: N/A;  . CYSTOSCOPY/URETEROSCOPY/HOLMIUM LASER/STENT PLACEMENT Left 11/13/2014   Procedure: CYSTOSCOPY/URETEROSCOPY/HOLMIUM LASER/STENT PLACEMENT;  Surgeon: Hollice Espy, MD;  Location: [facility] ORS;  Service: Urology;  Laterality: Left;  . EXTRACORPOREAL SHOCK WAVE LITHOTRIPSY Left 06/09/2018   Procedure: EXTRACORPOREAL SHOCK WAVE LITHOTRIPSY (ESWL);  Surgeon: Abbie Sons, MD;  Location: [facility] ORS;  Service: Urology;  Laterality: Left;  . HIP SURGERY    . KYPHOPLASTY N/A 06/04/2015   Procedure: KYPHOPLASTY T11, T12;  Surgeon: Hessie Knows, MD;  Location: [facility] ORS;  Service: Orthopedics;  Laterality: N/A;  . LITHOTRIPSY    . 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 PLACEMENT OF BREAST IMPLANTS    . TUBAL LIGATION    . VENTRAL HERNIA REPAIR    . WHIPPLE PROCEDURE     Home Medications:  Allergies as of 11/02/2018      Reactions   Sulfa Antibiotics Other (See Comments)   Does not remember reaction-from childhood      Medication  List       Accurate as of November 02, 2018  2:54 PM. If you have any questions, ask your nurse or doctor.        STOP taking these medications   HYDROcodone-acetaminophen 5-325 MG tablet Commonly known as: NORCO/VICODIN Stopped by: Hollice Espy, MD   nitrofurantoin (macrocrystal-monohydrate) 100 MG capsule Commonly known as: MACROBID Stopped by: Hollice Espy, MD   ondansetron 4 MG disintegrating tablet Commonly known as: Zofran ODT Stopped by: Hollice Espy, MD    oxyCODONE-acetaminophen 5-325 MG tablet Commonly known as: Percocet Stopped by: Hollice Espy, MD     TAKE these medications   alendronate 70 MG tablet Commonly known as: FOSAMAX   aspirin 81 MG tablet Take 81 mg by mouth daily.   AZO URINARY PAIN PO Take by mouth.   buPROPion 150 MG 12 hr tablet Commonly known as: WELLBUTRIN SR Take by mouth.   cephALEXin 500 MG capsule Commonly known as: KEFLEX Take 1 capsule (500 mg total) by mouth 4 (four) times daily for 12 days.   ciprofloxacin 500 MG tablet Commonly known as: Cipro Take 1 tablet (500 mg total) by mouth 2 (two) times daily. What changed:   medication strength  how much to take Changed by: Hollice Espy, MD   docusate sodium 100 MG capsule Commonly known as: Colace Take 1 tablet once or twice daily as needed for constipation while taking narcotic pain medicine   esomeprazole 20 MG capsule Commonly known as: NEXIUM Take 20 mg by mouth daily at 12 noon.   fluticasone 50 MCG/ACT nasal spray Commonly known as: FLONASE Place into the nose.   levocetirizine 5 MG tablet Commonly known as: XYZAL Take by mouth.   lisinopril 10 MG tablet Commonly known as: ZESTRIL Take by mouth.   Multi-Vitamins Tabs Take by mouth.   niacin 500 MG tablet Take by mouth.   Omega 3 1000 MG Caps Take by mouth.   Potassium Citrate 15 MEQ (1620 MG) Tbcr Take 1 tablet by mouth 2 (two) times daily.   tamsulosin 0.4 MG Caps capsule Commonly known as: FLOMAX Take 1 tablet by mouth daily until you pass the kidney stone or no longer have symptoms   Zenpep 20000-68000 units Cpep Generic drug: Pancrelipase (Lip-Prot-Amyl) TAKE AS DIRECTED BEFORE MEALS AND SNACKS       Allergies:  Allergies  Allergen Reactions  . Sulfa Antibiotics Other (See Comments)    Does not remember reaction-from childhood    Family History: Family History  Problem Relation Age of Onset  . Alzheimer's disease Father   . Kidney Stones Father    . Stroke Mother   . Diabetes type II Mother   . Breast cancer Neg Hx     Social History:  reports that she has quit smoking. She has never used smokeless tobacco. She reports that she does not drink alcohol or use drugs.  ROS: UROLOGY Frequent Urination?: No Hard to postpone urination?: No Burning/pain with urination?: Yes Get up at night to urinate?: No Leakage of urine?: No Urine stream starts and stops?: No Trouble starting stream?: No Do you have to strain to urinate?: No Blood in urine?: No Urinary tract infection?: Yes Sexually transmitted disease?: No Injury to kidneys or bladder?: No Painful intercourse?: No Weak stream?: No Currently pregnant?: No Vaginal bleeding?: No Last menstrual period?: n  Gastrointestinal Nausea?: No Vomiting?: No Indigestion/heartburn?: No Diarrhea?: No Constipation?: No  Constitutional Fever: No Night sweats?: No Weight loss?: No Fatigue?: No  Skin Skin  rash/lesions?: No Itching?: No  Eyes Blurred vision?: No  Ears/Nose/Throat Sore throat?: No Sinus problems?: No  Hematologic/Lymphatic Swollen glands?: No Easy bruising?: No  Cardiovascular Leg swelling?: No Chest pain?: No  Respiratory Cough?: No Shortness of breath?: No  Endocrine Excessive thirst?: No  Musculoskeletal Back pain?: No Joint pain?: No  Neurological Headaches?: No Dizziness?: No  Psychologic Depression?: No Anxiety?: No  Physical Exam: BP 110/70   Pulse (!) 109   Ht 4\' 10"  (1.473 m)   Wt 107 lb (48.5 kg)   BMI 22.36 kg/m   Constitutional:  Alert and oriented, No acute distress. HEENT: [place] AT, moist mucus membranes.  Trachea midline, no masses. Cardiovascular: No clubbing, cyanosis, or edema. Respiratory: Normal respiratory effort, no increased work of breathing. Skin: No rashes, bruises or suspicious lesions. Neurologic: Grossly intact, no focal deficits, moving all 4 extremities. Psychiatric: Normal mood and affect.   Laboratory Data: Lab Results  Component Value Date   WBC 12.7 (H) 10/30/2018   HGB 14.2 10/30/2018   HCT 42.5 10/30/2018   MCV 92.6 10/30/2018   PLT 218 10/30/2018    Lab Results  Component Value Date   CREATININE 0.98 10/30/2018    Urinalysis Glucose NEG Bil NEG Ket NEG SG 1.025 Blood 1+ PH 5.5 Pro NEG Uro 0.2 Nit NEG Leu TRACE  Pertinent Imaging: CLINICAL DATA:  Left-sided abdominal pain. Patient reports she cannot urinate. History of renal stones.  EXAM: CT ABDOMEN AND PELVIS WITHOUT CONTRAST  TECHNIQUE: Multidetector CT imaging of the abdomen and pelvis was performed following the standard protocol without IV contrast.  COMPARISON:  Renal ultrasound 07/01/2018. most recent CT 05/03/2015  IMPRESSION: 1. Obstructing 3 x 4 mm stone in the distal left ureter just proximal to the ureterovesicular junction with moderate to severe hydroureteronephrosis. 2. Nonobstructing stone in the left kidney. 3. Large colonic stool burden with colonic redundancy, suggesting constipation. 4. Moderate L4 superior endplate compression fracture, new from 2017 but age indeterminate. 5.  Aortic Atherosclerosis (ICD10-I70.0).   Electronically Signed   By: Keith Rake M.D.   On: 10/31/2018 01:48  CT scan imaging was personally reviewed today.  This was also directly compared to previous CT scans and it does appear that the degree of hydronephrosis is more pronounced today in the setting of obstructing ureteral calculus.  Assessment & Plan:    1. Left UVJ obstructing stone Based on patient's reported resolution of symptoms and the distal location of the stone at the time of imaging, I suspect the stone may have passed already. Will have patient return to clinic in 4 weeks with RUS to assess for hydronephrosis.  Warning symptoms were discussed at length today.  2. UTI Originally planned to adjust ED-prescribed antibiotic regimen from cephalexin to cipro, however  given benign UA in clinic today, will defer this at this time. Counseled patient to complete her cephalexin regimen as prescribed and call the office if she develops a fever or urinary frequency, urgency, or dysuria.  Return in about 4 weeks (around 11/30/2018) for RUS.  Debroah Loop, PA-C  [facility] [address] [place], [place] [address] [phone]  I have seen and examined the patient, labs/ imaging reviewed and discussed  management with Debroah Loop.  I reviewed the PA's note and agree with the documented findings and plan of care.

## 2018-11-17 ENCOUNTER — Other Ambulatory Visit
Admission: RE | Admit: 2018-11-17 | Discharge: 2018-11-17 | Disposition: A | Discharge status: Home / Self Care | Payer: Medicare Other | Source: Ambulatory Visit | Attending: Unknown Physician Specialty | Admitting: Unknown Physician Specialty

## 2018-11-25 ENCOUNTER — Other Ambulatory Visit: Payer: Medicare Other

## 2018-11-29 ENCOUNTER — Ambulatory Visit: Payer: Medicare Other | Admitting: Urology

## 2018-12-14 ENCOUNTER — Other Ambulatory Visit: Payer: Self-pay | Admitting: Neurology

## 2018-12-14 ENCOUNTER — Other Ambulatory Visit (HOSPITAL_COMMUNITY): Payer: Self-pay | Admitting: Neurology

## 2018-12-14 DIAGNOSIS — R413 Other amnesia: Secondary | ICD-10-CM

## 2018-12-30 ENCOUNTER — Other Ambulatory Visit
Admission: RE | Admit: 2018-12-30 | Payer: Medicare Other | Source: Ambulatory Visit | Attending: Unknown Physician Specialty | Admitting: Unknown Physician Specialty

## 2018-12-30 ENCOUNTER — Ambulatory Visit (HOSPITAL_COMMUNITY)
Admission: RE | Admit: 2018-12-30 | Discharge: 2018-12-30 | Disposition: A | Discharge status: Home / Self Care | Payer: Medicare Other | Source: Ambulatory Visit | Attending: Neurology | Admitting: Neurology

## 2018-12-30 ENCOUNTER — Other Ambulatory Visit: Payer: Self-pay

## 2018-12-30 DIAGNOSIS — R413 Other amnesia: Secondary | ICD-10-CM

## 2019-01-04 ENCOUNTER — Encounter: Admission: RE | Payer: Self-pay | Source: Ambulatory Visit

## 2019-01-04 ENCOUNTER — Ambulatory Visit: Admission: RE | Admit: 2019-01-04 | Payer: Medicare Other | Source: Ambulatory Visit | Admitting: Internal Medicine

## 2019-01-04 SURGERY — COLONOSCOPY WITH PROPOFOL
Anesthesia: General

## 2019-01-20 ENCOUNTER — Other Ambulatory Visit: Payer: Self-pay

## 2019-01-20 ENCOUNTER — Ambulatory Visit
Admission: RE | Admit: 2019-01-20 | Discharge: 2019-01-20 | Disposition: A | Discharge status: Home / Self Care | Payer: Medicare Other | Source: Ambulatory Visit | Attending: Urology | Admitting: Urology

## 2019-01-20 ENCOUNTER — Ambulatory Visit: Payer: Medicare Other

## 2019-01-20 DIAGNOSIS — N201 Calculus of ureter: Secondary | ICD-10-CM

## 2019-01-23 NOTE — Progress Notes (Signed)
01/24/2019 10:33 AM   Kimberly Meyer 01/13/1946 [id number]  Referring provider: Marinda Elk, MD [facility] [place],  [place] [address]  Chief Complaint  Patient presents with  . Follow-up    HPI: Kimberly Meyer is a 73 y.o. female with PMH nephrolithiasis with chronic mild left hydronephrosis who presents today for RUS report.    Background history Patient presented to [facility] ED on 10/30/2018 with sudden onset left-sided abdominal and flank pain with associated nausea and vomiting. She also reported an inability to urinate that resolved while in the ED.  CT stone study at the time demonstrated an obstructing left UVJ stone, 3x69mm in size, with moderate to severate hydroureteronephrosis on the ipsilateral side. Creatinine was 0.98; WBC were 12.7. Patient remained hemodynamically stable and afebrile throughout her stay. UA was significant for >50 RBCs and WBCs/HPF with rare bacteria. Subsequent urine culture was positive for >100k CFUs E faecalis.   She was seen on 11/02/2018 and her pain and nausea had abated.  She was taking Keflex for an urine culture positive for enterococcus faecalis.    Today, she is experiencing painful urination, nocturia and intermittency.   She states the burning is very infrequent and resolves with AZO.  Patient denies any gross hematuria, dysuria or suprapubic/flank pain.  Patient denies any fevers, chills, nausea or vomiting.    RUS on 01/20/2019 revealed a non-obstructing 10.7 mm stone in the lower pole of the left kidney.  PMH: Past Medical History:  Diagnosis Date  . 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 Anemia   . Anxiety   . Chronic tension headache   . Depression   . GERD (gastroesophageal reflux disease)   . History of pulmonary embolus (PE)   . Hyperlipemia   . Hypertension   . IBS (irritable bowel syndrome)   . Kidney stone   . Low back pain   . Situational stress     Surgical History: Past Surgical History:   Procedure Laterality Date  . APPENDECTOMY    . AUGMENTATION MAMMAPLASTY  1990's  . BLADDER SURGERY     bladder tack  . BREAST IMPLANT REMOVAL Left 07/15/2016   Procedure: REMOVAL OF EXPOSED LEFT BREAST IMPLANT;  Surgeon: Wallace Going, DO;  Location: [place];  Service: Plastics;  Laterality: Left;  . CHOLECYSTECTOMY    . COLONOSCOPY    . COLONOSCOPY WITH PROPOFOL N/A 07/22/2015   Procedure: COLONOSCOPY WITH PROPOFOL;  Surgeon: Manya Silvas, MD;  Location: [facility] ENDOSCOPY;  Service: Endoscopy;  Laterality: N/A;  . CYSTOSCOPY/URETEROSCOPY/HOLMIUM LASER/STENT PLACEMENT Left 11/13/2014   Procedure: CYSTOSCOPY/URETEROSCOPY/HOLMIUM LASER/STENT PLACEMENT;  Surgeon: Hollice Espy, MD;  Location: [facility] ORS;  Service: Urology;  Laterality: Left;  . EXTRACORPOREAL SHOCK WAVE LITHOTRIPSY Left 06/09/2018   Procedure: EXTRACORPOREAL SHOCK WAVE LITHOTRIPSY (ESWL);  Surgeon: Abbie Sons, MD;  Location: [facility] ORS;  Service: Urology;  Laterality: Left;  . HIP SURGERY    . KYPHOPLASTY N/A 06/04/2015   Procedure: KYPHOPLASTY T11, T12;  Surgeon: Hessie Knows, MD;  Location: [facility] ORS;  Service: Orthopedics;  Laterality: N/A;  . LITHOTRIPSY    . 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 PLACEMENT OF BREAST IMPLANTS    . TUBAL LIGATION    . VENTRAL HERNIA REPAIR    . WHIPPLE PROCEDURE     Home Medications:  Allergies as of 01/24/2019      Reactions   Sulfa Antibiotics Other (See Comments)   Does  not remember reaction-from childhood      Medication List       Accurate as of January 24, 2019 10:33 AM. If you have any questions, ask your nurse or doctor.        alendronate 70 MG tablet Commonly known as: FOSAMAX   aspirin 81 MG tablet Take 81 mg by mouth daily.   AZO URINARY PAIN PO Take by mouth.   buPROPion 150 MG 12 hr tablet Commonly known as: WELLBUTRIN SR Take by mouth.   ciprofloxacin 500 MG tablet Commonly known as: Cipro Take 1  tablet (500 mg total) by mouth 2 (two) times daily.   docusate sodium 100 MG capsule Commonly known as: Colace Take 1 tablet once or twice daily as needed for constipation while taking narcotic pain medicine   esomeprazole 20 MG capsule Commonly known as: NEXIUM Take 20 mg by mouth daily at 12 noon.   fluticasone 50 MCG/ACT nasal spray Commonly known as: FLONASE Place into the nose.   levocetirizine 5 MG tablet Commonly known as: XYZAL Take by mouth.   lisinopril 10 MG tablet Commonly known as: ZESTRIL Take by mouth.   Multi-Vitamins Tabs Take by mouth.   niacin 500 MG tablet Take by mouth.   Omega 3 1000 MG Caps Take by mouth.   Potassium Citrate 15 MEQ (1620 MG) Tbcr Take 1 tablet by mouth 2 (two) times daily.   tamsulosin 0.4 MG Caps capsule Commonly known as: FLOMAX Take 1 tablet by mouth daily until you pass the kidney stone or no longer have symptoms   Zenpep 20000-68000 units Cpep Generic drug: Pancrelipase (Lip-Prot-Amyl) TAKE AS DIRECTED BEFORE MEALS AND SNACKS       Allergies:  Allergies  Allergen Reactions  . Sulfa Antibiotics Other (See Comments)    Does not remember reaction-from childhood    Family History: Family History  Problem Relation Age of Onset  . Alzheimer's disease Father   . Kidney Stones Father   . Stroke Mother   . Diabetes type II Mother   . Breast cancer Neg Hx     Social History:  reports that she has quit smoking. She has never used smokeless tobacco. She reports that she does not drink alcohol or use drugs.  ROS: UROLOGY Frequent Urination?: No Hard to postpone urination?: No Burning/pain with urination?: Yes Get up at night to urinate?: Yes Leakage of urine?: No Urine stream starts and stops?: Yes Trouble starting stream?: No Do you have to strain to urinate?: No Blood in urine?: No Urinary tract infection?: No Sexually transmitted disease?: No Injury to kidneys or bladder?: No Painful intercourse?: No  Weak stream?: No Currently pregnant?: No Vaginal bleeding?: No Last menstrual period?: n  Gastrointestinal Nausea?: No Vomiting?: No Indigestion/heartburn?: No Diarrhea?: No Constipation?: No  Constitutional Fever: No Night sweats?: No Weight loss?: No Fatigue?: No  Skin Skin rash/lesions?: No Itching?: No  Eyes Blurred vision?: No Double vision?: No  Ears/Nose/Throat Sore throat?: No Sinus problems?: No  Hematologic/Lymphatic Swollen glands?: No Easy bruising?: No  Cardiovascular Leg swelling?: No Chest pain?: No  Respiratory Cough?: No Shortness of breath?: No  Endocrine Excessive thirst?: No  Musculoskeletal Back pain?: No Joint pain?: No  Neurological Headaches?: No Dizziness?: No  Psychologic Depression?: No Anxiety?: Yes  Physical Exam: BP 115/72   Pulse 88   Ht 4\' 10"  (1.473 m)   Wt 105 lb (47.6 kg)   BMI 21.95 kg/m   Constitutional:  Well nourished. Alert and oriented, No acute  distress. HEENT: [place] AT, moist mucus membranes.  Trachea midline, no masses. Cardiovascular: No clubbing, cyanosis, or edema. Respiratory: Normal respiratory effort, no increased work of breathing. Neurologic: Grossly intact, no focal deficits, moving all 4 extremities. Psychiatric: Normal mood and affect.   Laboratory Data: Lab Results  Component Value Date   WBC 12.7 (H) 10/30/2018   HGB 14.2 10/30/2018   HCT 42.5 10/30/2018   MCV 92.6 10/30/2018   PLT 218 10/30/2018    Lab Results  Component Value Date   CREATININE 0.98 10/30/2018    I have reviewed the labs.  Pertinent Imaging: CLINICAL DATA:  Left ureteral stone.  Pain.  EXAM: RENAL / URINARY TRACT ULTRASOUND COMPLETE  COMPARISON:  CT scan October 31, 2018  FINDINGS: Right Kidney:  Renal measurements: 10.7 x 3.5 x 5.1 cm = volume: 99 mL . Echogenicity within normal limits. No mass or hydronephrosis visualized.  Left Kidney:  Renal measurements: 10.3 x 4.1 x 4.4 cm = volume:  97 mL. There is a 10.7 mm stone in the lower pole of the left kidney. No hydronephrosis identified.  Bladder:  Appears normal for degree of bladder distention.  IMPRESSION: 1. Non-obstructing 10.7 mm stone in the lower pole of the left kidney.   Electronically Signed   By: Dorise Bullion III M.D   On: 01/20/2019 20:46 I have independently reviewed the films and note the left renal stone.   Assessment & Plan:    1. Left UVJ obstructing stone Likely passed No hydro on RUS  2. Left lower pole stone Patient is not desiring treatment at this time as she had to care for her husband She will return in 6 months for KUB and office visit Patient is advised that if they should start to experience pain that is not able to be controlled with pain medication, intractable nausea and/or vomiting and/or fevers greater than 103 or shaking chills to contact the office immediately or seek treatment in the emergency department for emergent intervention.      Return in about 6 months (around 07/24/2019) for KUB.  Zara Council, PA-C  [facility] [address] [place], [place] [address] [phone]

## 2019-01-24 ENCOUNTER — Ambulatory Visit (INDEPENDENT_AMBULATORY_CARE_PROVIDER_SITE_OTHER): Payer: Medicare Other | Admitting: Urology

## 2019-01-24 ENCOUNTER — Encounter: Payer: Self-pay | Admitting: Urology

## 2019-01-24 ENCOUNTER — Other Ambulatory Visit: Payer: Self-pay

## 2019-01-24 VITALS — BP 115/72 | HR 88 | Ht <= 58 in | Wt 105.0 lb

## 2019-01-24 DIAGNOSIS — N200 Calculus of kidney: Secondary | ICD-10-CM

## 2019-03-20 ENCOUNTER — Other Ambulatory Visit: Payer: Self-pay

## 2019-03-20 DIAGNOSIS — Z20822 Contact with and (suspected) exposure to covid-19: Secondary | ICD-10-CM

## 2019-03-22 LAB — NOVEL CORONAVIRUS, NAA: SARS-CoV-2, NAA: NOT DETECTED

## 2019-05-16 ENCOUNTER — Other Ambulatory Visit: Payer: No Typology Code available for payment source | Attending: Internal Medicine

## 2019-05-18 ENCOUNTER — Ambulatory Visit: Admission: RE | Admit: 2019-05-18 | Payer: Medicare Other | Source: Home / Self Care | Admitting: Internal Medicine

## 2019-05-18 ENCOUNTER — Encounter: Admission: RE | Payer: Self-pay | Source: Home / Self Care

## 2019-05-18 SURGERY — COLONOSCOPY WITH PROPOFOL
Anesthesia: General

## 2019-06-10 ENCOUNTER — Ambulatory Visit: Payer: Medicare Other

## 2019-06-16 ENCOUNTER — Ambulatory Visit: Payer: Medicare Other

## 2019-06-23 ENCOUNTER — Ambulatory Visit: Payer: Medicare Other | Attending: Internal Medicine

## 2019-06-23 DIAGNOSIS — Z23 Encounter for immunization: Secondary | ICD-10-CM

## 2019-06-23 NOTE — Progress Notes (Signed)
   Covid-19 Vaccination Clinic  Name:  Kimberly Meyer    MRN: [id number] DOB: [birth year]  06/23/2019  Kimberly Meyer was observed post Covid-19 immunization for 15 minutes without incidence. She was provided with Vaccine Information Sheet and instruction to access the V-Safe system.   Kimberly Meyer was instructed to call 911 with any severe reactions post vaccine: Marland Kitchen Difficulty breathing  . Swelling of your face and throat  . A fast heartbeat  . A bad rash all over your body  . Dizziness and weakness    Immunizations Administered    Name Date Dose VIS Date Route   Pfizer COVID-19 Vaccine 06/23/2019  8:58 AM 0.3 mL 04/07/2019 Intramuscular   Manufacturer: [place]   Lot: [id number]   [place]: [id number]

## 2019-06-25 ENCOUNTER — Ambulatory Visit: Payer: Medicare Other

## 2019-07-18 ENCOUNTER — Ambulatory Visit: Payer: Medicare Other | Attending: Internal Medicine

## 2019-07-18 DIAGNOSIS — Z23 Encounter for immunization: Secondary | ICD-10-CM

## 2019-07-18 NOTE — Progress Notes (Signed)
   Covid-19 Vaccination Clinic  Name:  Kimberly Meyer    MRN: [id number] DOB: 07/09/1945  07/18/2019  Kimberly Meyer was observed post Covid-19 immunization for 15 minutes without incident. She was provided with Vaccine Information Sheet and instruction to access the V-Safe system.   Kimberly Meyer was instructed to call 911 with any severe reactions post vaccine: Marland Kitchen Difficulty breathing  . Swelling of face and throat  . A fast heartbeat  . A bad rash all over body  . Dizziness and weakness   Immunizations Administered    Name Date Dose VIS Date Route   Pfizer COVID-19 Vaccine 07/18/2019  1:43 PM 0.3 mL 04/07/2019 Intramuscular   Manufacturer: [employer], [employer]   Lot: [id number]   [place]: [id number]

## 2019-07-25 ENCOUNTER — Encounter: Payer: Self-pay | Admitting: Urology

## 2019-07-25 ENCOUNTER — Ambulatory Visit
Admission: RE | Admit: 2019-07-25 | Discharge: 2019-07-25 | Disposition: A | Discharge status: Home / Self Care | Payer: Medicare Other | Attending: Urology | Admitting: Urology

## 2019-07-25 ENCOUNTER — Ambulatory Visit
Admission: RE | Admit: 2019-07-25 | Discharge: 2019-07-25 | Disposition: A | Discharge status: Home / Self Care | Payer: Medicare Other | Source: Ambulatory Visit | Attending: Urology | Admitting: Urology

## 2019-07-25 ENCOUNTER — Ambulatory Visit: Payer: No Typology Code available for payment source | Admitting: Urology

## 2019-07-25 DIAGNOSIS — N200 Calculus of kidney: Secondary | ICD-10-CM

## 2019-11-13 ENCOUNTER — Other Ambulatory Visit
Admission: RE | Admit: 2019-11-13 | Discharge: 2019-11-13 | Disposition: A | Discharge status: Home / Self Care | Payer: Medicare Other | Source: Ambulatory Visit | Attending: Internal Medicine | Admitting: Internal Medicine

## 2019-11-13 ENCOUNTER — Other Ambulatory Visit: Payer: Self-pay

## 2019-11-13 DIAGNOSIS — Z20822 Contact with and (suspected) exposure to covid-19: Secondary | ICD-10-CM | POA: Insufficient documentation

## 2019-11-13 DIAGNOSIS — Z01812 Encounter for preprocedural laboratory examination: Secondary | ICD-10-CM | POA: Insufficient documentation

## 2019-11-13 LAB — SARS CORONAVIRUS 2 (TAT 6-24 HRS): SARS Coronavirus 2: NEGATIVE

## 2019-11-14 ENCOUNTER — Encounter: Payer: Self-pay | Admitting: Internal Medicine

## 2019-11-15 ENCOUNTER — Other Ambulatory Visit: Payer: Self-pay

## 2019-11-15 ENCOUNTER — Ambulatory Visit
Admission: RE | Admit: 2019-11-15 | Discharge: 2019-11-15 | Disposition: A | Discharge status: Home / Self Care | Payer: Medicare Other | Attending: Internal Medicine | Admitting: Internal Medicine

## 2019-11-15 ENCOUNTER — Encounter: Payer: Self-pay | Admitting: Internal Medicine

## 2019-11-15 ENCOUNTER — Encounter: Payer: Self-pay | Admitting: Anesthesiology

## 2019-11-15 ENCOUNTER — Encounter
Admission: RE | Disposition: A | Discharge status: Home / Self Care | Payer: Self-pay | Source: Home / Self Care | Attending: Internal Medicine

## 2019-11-15 DIAGNOSIS — Z539 Procedure and treatment not carried out, unspecified reason: Secondary | ICD-10-CM | POA: Insufficient documentation

## 2019-11-15 SURGERY — COLONOSCOPY WITH PROPOFOL
Anesthesia: General

## 2019-11-15 MED ORDER — PROPOFOL 500 MG/50ML IV EMUL
INTRAVENOUS | Status: AC
  Filled 2019-11-15: qty 450

## 2019-11-15 MED ORDER — SODIUM CHLORIDE 0.9 % IV SOLN: INTRAVENOUS | Status: DC

## 2019-11-15 NOTE — Anesthesia Preprocedure Evaluation (Deleted)
Anesthesia Evaluation  Patient identified by MRN, date of birth, ID band Patient awake    Reviewed: Allergy & Precautions, H&P , NPO status , Patient's Chart, lab work & pertinent test results, reviewed documented beta blocker date and time   Airway Mallampati: II   Neck ROM: full    Dental  (+) Poor Dentition   Pulmonary neg pulmonary ROS, former smoker,    Pulmonary exam normal        Cardiovascular Exercise Tolerance: Good hypertension, On Medications negative cardio ROS Normal cardiovascular exam Rhythm:regular Rate:Normal     Neuro/Psych  Headaches, Anxiety Depression negative psych ROS   GI/Hepatic Neg liver ROS, GERD  Medicated,  Endo/Other  negative endocrine ROS  Renal/GU Renal disease  negative genitourinary   Musculoskeletal   Abdominal   Peds  Hematology  (+) Blood dyscrasia, anemia ,   Anesthesia Other Findings Past Medical History: No date: Ampullary carcinoma ([facility]) No date: Anemia No date: Anxiety No date: Chronic tension headache No date: Depression No date: GERD (gastroesophageal reflux disease) No date: History of pulmonary embolus (PE) No date: Hyperlipemia No date: Hypertension No date: IBS (irritable bowel syndrome) No date: Kidney stone No date: Low back pain No date: Situational stress Past Surgical History: No date: APPENDECTOMY 1990's: AUGMENTATION MAMMAPLASTY No date: BLADDER SURGERY     Comment:  bladder tack 07/15/2016: BREAST IMPLANT REMOVAL; Left     Comment:  Procedure: REMOVAL OF EXPOSED LEFT BREAST IMPLANT;                Surgeon: Wallace Going, DO;  Location: [place];  Service: Plastics;  Laterality: Left; No date: CHOLECYSTECTOMY No date: COLONOSCOPY 07/22/2015: COLONOSCOPY WITH PROPOFOL; N/A     Comment:  Procedure: COLONOSCOPY WITH PROPOFOL;  Surgeon: Manya Silvas, MD;  Location: [facility] ENDOSCOPY;  Service:                Endoscopy;  Laterality: N/A; 11/13/2014: CYSTOSCOPY/URETEROSCOPY/HOLMIUM LASER/STENT PLACEMENT; Left     Comment:  Procedure: CYSTOSCOPY/URETEROSCOPY/HOLMIUM LASER/STENT               PLACEMENT;  Surgeon: Hollice Espy, MD;  Location: [facility]               ORS;  Service: Urology;  Laterality: Left; 06/09/2018: EXTRACORPOREAL SHOCK WAVE LITHOTRIPSY; Left     Comment:  Procedure: EXTRACORPOREAL SHOCK WAVE LITHOTRIPSY (ESWL);              Surgeon: Abbie Sons, MD;  Location: [facility] ORS;                Service: Urology;  Laterality: Left; No date: HIP SURGERY 06/04/2015: KYPHOPLASTY; N/A     Comment:  Procedure: KYPHOPLASTY T11, T12;  Surgeon: Hessie Knows,              MD;  Location: [facility] ORS;  Service: Orthopedics;                Laterality: N/A; No date: LITHOTRIPSY No date: 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 No date: PLACEMENT OF BREAST IMPLANTS No date: TUBAL LIGATION No date: VENTRAL HERNIA REPAIR No date: WHIPPLE PROCEDURE   Reproductive/Obstetrics negative OB ROS  Anesthesia Physical Anesthesia Plan  ASA: III  Anesthesia Plan: General   Post-op Pain Management:    Induction:   PONV Risk Score and Plan:   Airway Management Planned:   Additional Equipment:   Intra-op Plan:   Post-operative Plan:   Informed Consent: I have reviewed the patients History and Physical, chart, labs and discussed the procedure including the risks, benefits and alternatives for the proposed anesthesia with the patient or authorized representative who has indicated his/her understanding and acceptance.     Dental Advisory Given  Plan Discussed with: CRNA  Anesthesia Plan Comments:         Anesthesia Quick Evaluation

## 2020-06-11 ENCOUNTER — Other Ambulatory Visit: Payer: Self-pay

## 2020-06-11 ENCOUNTER — Emergency Department: Payer: Medicare Other

## 2020-06-11 ENCOUNTER — Emergency Department
Admission: EM | Admit: 2020-06-11 | Discharge: 2020-06-11 | Disposition: A | Discharge status: Home / Self Care | Payer: Medicare Other | Attending: Emergency Medicine | Admitting: Emergency Medicine

## 2020-06-11 DIAGNOSIS — I10 Essential (primary) hypertension: Secondary | ICD-10-CM | POA: Diagnosis not present

## 2020-06-11 DIAGNOSIS — S42201A Unspecified fracture of upper end of right humerus, initial encounter for closed fracture: Secondary | ICD-10-CM | POA: Insufficient documentation

## 2020-06-11 DIAGNOSIS — Z85068 Personal history of other malignant neoplasm of small intestine: Secondary | ICD-10-CM | POA: Diagnosis not present

## 2020-06-11 DIAGNOSIS — W1839XA Other fall on same level, initial encounter: Secondary | ICD-10-CM | POA: Diagnosis not present

## 2020-06-11 DIAGNOSIS — Z79899 Other long term (current) drug therapy: Secondary | ICD-10-CM | POA: Diagnosis not present

## 2020-06-11 DIAGNOSIS — Z7982 Long term (current) use of aspirin: Secondary | ICD-10-CM | POA: Diagnosis not present

## 2020-06-11 DIAGNOSIS — S4991XA Unspecified injury of right shoulder and upper arm, initial encounter: Secondary | ICD-10-CM | POA: Diagnosis present

## 2020-06-11 DIAGNOSIS — S42291A Other displaced fracture of upper end of right humerus, initial encounter for closed fracture: Secondary | ICD-10-CM

## 2020-06-11 DIAGNOSIS — Z87891 Personal history of nicotine dependence: Secondary | ICD-10-CM | POA: Diagnosis not present

## 2020-06-11 MED ORDER — LIDOCAINE 5 % EX PTCH: 1.0000 | MEDICATED_PATCH | Freq: Two times a day (BID) | CUTANEOUS | 0 refills | Status: DC

## 2020-06-11 MED ORDER — LIDOCAINE 5 % EX PTCH
1.0000 | MEDICATED_PATCH | CUTANEOUS | Status: DC
  Administered 2020-06-11: 1 via TRANSDERMAL
  Filled 2020-06-11: qty 1

## 2020-06-11 NOTE — Discharge Instructions (Signed)
Follow discharge care instructions and wear sling until evaluation by orthopedics.  Call today and tell them you are a follow-up from the emergency room.  They will schedule you for an appointment.  Advise extra strength Tylenol along with the Lidoderm patch as prescribed.

## 2020-06-11 NOTE — ED Triage Notes (Signed)
Pt had mechanical fall a few days ago and landed on right shoulder. States pain hasn't gone away and it's hard for her to move it.

## 2020-06-11 NOTE — ED Provider Notes (Signed)
[facility] Emergency Department Provider Note   ____________________________________________   Event Date/Time   First MD Initiated Contact with Patient 06/11/20 [phone]     (approximate)  I have reviewed the triage vital signs and the nursing notes.   HISTORY  Chief Complaint Arm Pain    HPI Kimberly Meyer is a 75 y.o. female patient presents with right upper arm pain secondary to mechanical fall a few days ago.  Patient she landed on her right shoulder.  Patient states pain continues and decreased range of motion with adduction overhead reaching.  Patient dementia state requires regular history from son.  Patient rates pain as a 10/10.         Past Medical History:  Diagnosis Date  . 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 Anemia   . Anxiety   . Chronic tension headache   . Depression   . GERD (gastroesophageal reflux disease)   . History of pulmonary embolus (PE)   . Hyperlipemia   . Hypertension   . IBS (irritable bowel syndrome)   . Kidney stone   . Low back pain   . Situational stress     Patient Active Problem List   Diagnosis Date Noted  . NAFLD (nonalcoholic fatty liver disease) 05/24/2017  . Age-related osteoporosis without current pathological fracture 03/03/2017  . Breast asymmetry 11/19/2016  . 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 Chronic tension-type headache, intractable 11/07/2014  . Acid reflux 11/07/2014  . HLD (hyperlipidemia) 11/07/2014  . BP (high blood pressure) 11/07/2014  . 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  . Calculus of kidney 11/07/2014  . PE (pulmonary embolism) 11/07/2014  . Functional bowel disorder 11/07/2014  . History of artificial joint 03/06/2014  . Acute situational disturbance 02/21/2014  . Pulmonary embolism ([place]) 02/27/2009    Past Surgical History:  Procedure Laterality Date  . APPENDECTOMY     . AUGMENTATION MAMMAPLASTY  1990's  . BLADDER SURGERY     bladder tack  . BREAST IMPLANT REMOVAL Left 07/15/2016   Procedure: REMOVAL OF EXPOSED LEFT BREAST IMPLANT;  Surgeon: Wallace Going, DO;  Location: [place];  Service: Plastics;  Laterality: Left;  . CHOLECYSTECTOMY    . COLONOSCOPY    . COLONOSCOPY WITH PROPOFOL N/A 07/22/2015   Procedure: COLONOSCOPY WITH PROPOFOL;  Surgeon: Manya Silvas, MD;  Location: [facility];  Service: Endoscopy;  Laterality: N/A;  . CYSTOSCOPY/URETEROSCOPY/HOLMIUM LASER/STENT PLACEMENT Left 11/13/2014   Procedure: CYSTOSCOPY/URETEROSCOPY/HOLMIUM LASER/STENT PLACEMENT;  Surgeon: Hollice Espy, MD;  Location: [facility] ORS;  Service: Urology;  Laterality: Left;  . EXTRACORPOREAL SHOCK WAVE LITHOTRIPSY Left 06/09/2018   Procedure: EXTRACORPOREAL SHOCK WAVE LITHOTRIPSY (ESWL);  Surgeon: Abbie Sons, MD;  Location: [facility] ORS;  Service: Urology;  Laterality: Left;  . HIP SURGERY    . KYPHOPLASTY N/A 06/04/2015   Procedure: KYPHOPLASTY T11, T12;  Surgeon: Hessie Knows, MD;  Location: [facility] ORS;  Service: Orthopedics;  Laterality: N/A;  . LITHOTRIPSY    . 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 PLACEMENT OF BREAST IMPLANTS    . TUBAL LIGATION    . VENTRAL HERNIA REPAIR    . WHIPPLE PROCEDURE      Prior to Admission medications   Medication Sig Start Date End Date Taking? Authorizing Provider  lidocaine (LIDODERM) 5 % Place 1 patch onto the skin every 12 (twelve) hours. Remove &  Discard patch within 12 hours or as directed by MD 06/11/20 06/11/21 Yes Sable Feil, PA-C  alendronate (FOSAMAX) 70 MG tablet  06/20/18   [provider]  Ascorbic Acid (VITAMIN C) 1000 MG tablet Take 1,000 mg by mouth daily.    [provider]  aspirin 81 MG tablet Take 81 mg by mouth daily.    [provider]  azelastine (ASTELIN) 0.1 % nasal spray Place into both nostrils 2 (two) times daily. Use in each  nostril as directed    [provider]  Beta Carotene (VITAMIN A) 25000 UNIT capsule Take 25,000 Units by mouth daily.    [provider]  buPROPion (WELLBUTRIN SR) 150 MG 12 hr tablet Take by mouth. 09/27/14   [provider]  docusate sodium (COLACE) 100 MG capsule Take 1 tablet once or twice daily as needed for constipation while taking narcotic pain medicine 10/31/18   Hinda Kehr, MD  donepezil (ARICEPT) 5 MG tablet Take 5 mg by mouth at bedtime.    [provider]  escitalopram (LEXAPRO) 10 MG tablet Take 10 mg by mouth daily.    [provider]  esomeprazole (NEXIUM) 20 MG capsule Take 20 mg by mouth daily at 12 noon.    [provider]  fluticasone (FLONASE) 50 MCG/ACT nasal spray Place into the nose. 09/27/14   [provider]  levocetirizine (XYZAL) 5 MG tablet Take by mouth.    [provider]  lisinopril (PRINIVIL,ZESTRIL) 10 MG tablet Take by mouth. 09/27/14   [provider]  Multiple Vitamin (MULTI-VITAMINS) TABS Take by mouth.    [provider]  niacin 500 MG tablet Take by mouth.    [provider]  Pancrelipase, Lip-Prot-Amyl, (ZENPEP) 20000 UNITS CPEP TAKE AS DIRECTED BEFORE MEALS AND SNACKS 09/27/14   [provider]  Phenazopyridine HCl (AZO URINARY PAIN PO) Take by mouth.    [provider]  Potassium Citrate 15 MEQ (1620 MG) TBCR Take 1 tablet by mouth 2 (two) times daily. 04/28/18   Hollice Espy, MD  psyllium (METAMUCIL) 58.6 % packet Take 1 packet by mouth daily.    [provider]  tamsulosin (FLOMAX) 0.4 MG CAPS capsule Take 1 tablet by mouth daily until you pass the kidney stone or no longer have symptoms 10/31/18   Hinda Kehr, MD  valACYclovir (VALTREX) 1000 MG tablet Take 1,000 mg by mouth 2 (two) times daily.    [provider]  vitamin B-12 (CYANOCOBALAMIN) 1000 MCG tablet Take 1,000 mcg by mouth daily.    [provider]     Allergies Sulfa antibiotics  Family History  Problem Relation Age of Onset  . Alzheimer's disease Father   . Kidney Stones Father   . Stroke Mother   . Diabetes type II Mother   . Breast cancer Neg Hx     Social History Social History   Tobacco Use  . Smoking status: Former Research scientist (life sciences)  . Smokeless tobacco: Never Used  Vaping Use  . Vaping Use: Never used  Substance Use Topics  . Alcohol use: No    Alcohol/week: 0.0 standard drinks  . Drug use: No    Review of Systems Constitutional: No fever/chills Eyes: No visual changes. ENT: No sore throat. Cardiovascular: Denies chest pain. Respiratory: Denies shortness of breath. Gastrointestinal: No abdominal pain.  No nausea, no vomiting.  No diarrhea.  No constipation. Genitourinary: Negative for dysuria. Musculoskeletal: Right upper arm pain. Skin: Negative for rash. Neurological: Negative for headaches,  focal weakness or numbness. Psychiatric:  Anxiety and depression. Endocrine:  Hyperlipidemia and hypertension. Allergic/Immunilogical: Sulfa antibiotics.  ____________________________________________   PHYSICAL EXAM:  VITAL SIGNS: ED Triage Vitals [06/11/20 0901]  Enc Vitals Group     BP 128/86     Pulse Rate 71     Resp 18     Temp 98.3 F (36.8 C)     Temp Source Oral     SpO2 97 %     Weight 111 lb (50.3 kg)     Height 4\' 10"  (1.473 m)     Head Circumference      Peak Flow      Pain Score 10     Pain Loc      Pain Edu?      Excl. in [place]?     Constitutional: Alert and oriented. Well appearing and in no acute distress. Hematological/Lymphatic/Immunilogical: No cervical lymphadenopathy. Cardiovascular: Normal rate, regular rhythm. Grossly normal heart sounds.  Good peripheral circulation. Respiratory: Normal respiratory effort.  No retractions. Lungs CTAB. Musculoskeletal: No obvious deformity to the right upper arm.  Patient is moderate guarding palpation of the shaft of the humerus.  Decreased range of  motion all fields.. Neurologic:  Normal speech and language. No gross focal neurologic deficits are appreciated. No gait instability. Skin:  Skin is warm, dry and intact. No rash noted.  No obvious abrasion or ecchymosis. Psychiatric: Mood and affect are normal. Speech and behavior are normal.  ____________________________________________   LABS (all labs ordered are listed, but only abnormal results are displayed)  Labs Reviewed - No data to display ____________________________________________  EKG   ____________________________________________  RADIOLOGY I, Sable Feil, personally viewed and evaluated these images (plain radiographs) as part of my medical decision making, as well as reviewing the written report by the radiologist.  ED MD interpretation:    Official radiology report(s): DG Shoulder Right  Result Date: 06/11/2020 CLINICAL DATA:  Mechanical fall few days ago landed on right shoulder, persistent pain EXAM: RIGHT SHOULDER - 2+ VIEW COMPARISON:  None. FINDINGS: Comminuted and mildly impacted fracture of the surgical neck of the humerus. Soft tissue swelling about the shoulder. Mild AC joint degenerative change. Visualized portions of the right lung appear clear. IMPRESSION: Comminuted and mildly impacted fracture of the surgical neck of the right humerus. Electronically Signed   By: Dahlia Bailiff MD   On: 06/11/2020 09:49    ____________________________________________   PROCEDURES  Procedure(s) performed (including Critical Care):  Procedures   ____________________________________________   INITIAL IMPRESSION / ASSESSMENT AND PLAN / ED COURSE  As part of my medical decision making, I reviewed the following data within the [place]         Patient presents with right upper arm pain secondary to a fall a few days ago.  Patient states she landed her right shoulder.  Did not seek medical care for initial complaint.  Presents today with  decreased range of motion increased pain.  Discussed x-ray findings with patient and son revealed a humeral neck fracture.  Patient placed in arm sling and given discharge care instruction.  Will follow orthopedic for definitive evaluation and treatment.      ____________________________________________   FINAL CLINICAL IMPRESSION(S) / ED DIAGNOSES  Final diagnoses:  Humeral head fracture, right, closed, initial encounter     ED Discharge Orders         Ordered    lidocaine (LIDODERM) 5 %  Every 12 hours  06/11/20 1005          *Please note:  Kimberly Meyer was evaluated in Emergency Department on 06/11/2020 for the symptoms described in the history of present illness. She was evaluated in the context of the global COVID-19 pandemic, which necessitated consideration that the patient might be at risk for infection with the SARS-CoV-2 virus that causes COVID-19. Institutional protocols and algorithms that pertain to the evaluation of patients at risk for COVID-19 are in a state of rapid change based on information released by regulatory bodies including the CDC and federal and state organizations. These policies and algorithms were followed during the patient's care in the ED.  Some ED evaluations and interventions may be delayed as a result of limited staffing during and the pandemic.*   Note:  This document was prepared using Dragon voice recognition software and may include unintentional dictation errors.    Sable Feil, PA-C 06/11/20 1009    Nance Pear, MD 06/11/20 1055

## 2020-06-11 NOTE — ED Notes (Signed)
See triage note  Presents ws/p fall on Sunday  States she landed on right shoulder   Increased pain with movement  Good pulses

## 2020-07-30 ENCOUNTER — Other Ambulatory Visit (HOSPITAL_COMMUNITY): Payer: Self-pay | Admitting: Physician Assistant

## 2020-07-30 ENCOUNTER — Other Ambulatory Visit: Payer: Self-pay | Admitting: Physician Assistant

## 2020-07-30 DIAGNOSIS — R7989 Other specified abnormal findings of blood chemistry: Secondary | ICD-10-CM

## 2020-08-01 ENCOUNTER — Other Ambulatory Visit: Payer: Self-pay

## 2020-08-01 ENCOUNTER — Ambulatory Visit
Admission: RE | Admit: 2020-08-01 | Discharge: 2020-08-01 | Disposition: A | Discharge status: Home / Self Care | Payer: Medicare Other | Source: Ambulatory Visit | Attending: Physician Assistant | Admitting: Physician Assistant

## 2020-08-01 DIAGNOSIS — R7989 Other specified abnormal findings of blood chemistry: Secondary | ICD-10-CM | POA: Insufficient documentation

## 2020-12-24 ENCOUNTER — Other Ambulatory Visit: Payer: Self-pay | Admitting: Gastroenterology

## 2020-12-24 DIAGNOSIS — R112 Nausea with vomiting, unspecified: Secondary | ICD-10-CM

## 2021-01-10 ENCOUNTER — Ambulatory Visit
Admission: RE | Admit: 2021-01-10 | Discharge: 2021-01-10 | Disposition: A | Discharge status: Home / Self Care | Payer: Medicare Other | Source: Ambulatory Visit | Attending: Gastroenterology | Admitting: Gastroenterology

## 2021-01-10 ENCOUNTER — Other Ambulatory Visit: Payer: Self-pay

## 2021-01-10 DIAGNOSIS — R112 Nausea with vomiting, unspecified: Secondary | ICD-10-CM | POA: Diagnosis present

## 2021-01-10 MED ORDER — IOHEXOL 300 MG/ML  SOLN
80.0000 mL | Freq: Once | INTRAMUSCULAR | Status: AC | PRN
  Administered 2021-01-10: 80 mL via INTRAVENOUS

## 2021-01-30 ENCOUNTER — Other Ambulatory Visit: Payer: Self-pay

## 2021-01-30 ENCOUNTER — Other Ambulatory Visit: Payer: Self-pay | Admitting: Physician Assistant

## 2021-01-30 ENCOUNTER — Ambulatory Visit
Admission: RE | Admit: 2021-01-30 | Discharge: 2021-01-30 | Disposition: A | Discharge status: Home / Self Care | Payer: Medicare Other | Source: Ambulatory Visit | Attending: Physician Assistant | Admitting: Physician Assistant

## 2021-01-30 DIAGNOSIS — R1011 Right upper quadrant pain: Secondary | ICD-10-CM | POA: Diagnosis present

## 2021-03-17 ENCOUNTER — Other Ambulatory Visit: Payer: Self-pay

## 2021-03-17 ENCOUNTER — Emergency Department: Payer: Medicare Other

## 2021-03-17 ENCOUNTER — Encounter: Payer: Self-pay | Admitting: Intensive Care

## 2021-03-17 ENCOUNTER — Inpatient Hospital Stay
Admission: EM | Admit: 2021-03-17 | Discharge: 2021-03-21 | DRG: 522 | Disposition: A | Discharge status: Skilled Nursing Facility | Payer: Medicare Other | Source: Skilled Nursing Facility | Attending: Internal Medicine | Admitting: Internal Medicine

## 2021-03-17 DIAGNOSIS — M47814 Spondylosis without myelopathy or radiculopathy, thoracic region: Secondary | ICD-10-CM | POA: Diagnosis present

## 2021-03-17 DIAGNOSIS — S72002A Fracture of unspecified part of neck of left femur, initial encounter for closed fracture: Secondary | ICD-10-CM

## 2021-03-17 DIAGNOSIS — S32009A Unspecified fracture of unspecified lumbar vertebra, initial encounter for closed fracture: Secondary | ICD-10-CM | POA: Diagnosis present

## 2021-03-17 DIAGNOSIS — F0390 Unspecified dementia without behavioral disturbance: Secondary | ICD-10-CM | POA: Diagnosis not present

## 2021-03-17 DIAGNOSIS — W19XXXA Unspecified fall, initial encounter: Secondary | ICD-10-CM

## 2021-03-17 DIAGNOSIS — F03A4 Unspecified dementia, mild, with anxiety: Secondary | ICD-10-CM | POA: Diagnosis present

## 2021-03-17 DIAGNOSIS — E785 Hyperlipidemia, unspecified: Secondary | ICD-10-CM | POA: Diagnosis present

## 2021-03-17 DIAGNOSIS — S22029A Unspecified fracture of second thoracic vertebra, initial encounter for closed fracture: Secondary | ICD-10-CM

## 2021-03-17 DIAGNOSIS — K589 Irritable bowel syndrome without diarrhea: Secondary | ICD-10-CM | POA: Diagnosis present

## 2021-03-17 DIAGNOSIS — Y92129 Unspecified place in nursing home as the place of occurrence of the external cause: Secondary | ICD-10-CM | POA: Diagnosis not present

## 2021-03-17 DIAGNOSIS — G8918 Other acute postprocedural pain: Secondary | ICD-10-CM

## 2021-03-17 DIAGNOSIS — Z66 Do not resuscitate: Secondary | ICD-10-CM | POA: Diagnosis present

## 2021-03-17 DIAGNOSIS — I10 Essential (primary) hypertension: Secondary | ICD-10-CM | POA: Diagnosis present

## 2021-03-17 DIAGNOSIS — M5137 Other intervertebral disc degeneration, lumbosacral region: Secondary | ICD-10-CM | POA: Diagnosis present

## 2021-03-17 DIAGNOSIS — Z87891 Personal history of nicotine dependence: Secondary | ICD-10-CM

## 2021-03-17 DIAGNOSIS — S32012A Unstable burst fracture of first lumbar vertebra, initial encounter for closed fracture: Secondary | ICD-10-CM | POA: Diagnosis not present

## 2021-03-17 DIAGNOSIS — Z7983 Long term (current) use of bisphosphonates: Secondary | ICD-10-CM | POA: Diagnosis not present

## 2021-03-17 DIAGNOSIS — Z90411 Acquired partial absence of pancreas: Secondary | ICD-10-CM

## 2021-03-17 DIAGNOSIS — Z20822 Contact with and (suspected) exposure to covid-19: Secondary | ICD-10-CM | POA: Diagnosis present

## 2021-03-17 DIAGNOSIS — M25552 Pain in left hip: Secondary | ICD-10-CM | POA: Diagnosis present

## 2021-03-17 DIAGNOSIS — S32011A Stable burst fracture of first lumbar vertebra, initial encounter for closed fracture: Secondary | ICD-10-CM | POA: Diagnosis present

## 2021-03-17 DIAGNOSIS — K219 Gastro-esophageal reflux disease without esophagitis: Secondary | ICD-10-CM | POA: Diagnosis present

## 2021-03-17 DIAGNOSIS — M810 Age-related osteoporosis without current pathological fracture: Secondary | ICD-10-CM | POA: Diagnosis present

## 2021-03-17 DIAGNOSIS — Z820 Family history of epilepsy and other diseases of the nervous system: Secondary | ICD-10-CM

## 2021-03-17 DIAGNOSIS — R531 Weakness: Secondary | ICD-10-CM | POA: Diagnosis present

## 2021-03-17 DIAGNOSIS — S22009A Unspecified fracture of unspecified thoracic vertebra, initial encounter for closed fracture: Secondary | ICD-10-CM | POA: Diagnosis present

## 2021-03-17 DIAGNOSIS — M5136 Other intervertebral disc degeneration, lumbar region: Secondary | ICD-10-CM | POA: Diagnosis present

## 2021-03-17 DIAGNOSIS — E119 Type 2 diabetes mellitus without complications: Secondary | ICD-10-CM

## 2021-03-17 DIAGNOSIS — I2699 Other pulmonary embolism without acute cor pulmonale: Secondary | ICD-10-CM | POA: Diagnosis present

## 2021-03-17 DIAGNOSIS — M4854XA Collapsed vertebra, not elsewhere classified, thoracic region, initial encounter for fracture: Secondary | ICD-10-CM | POA: Diagnosis present

## 2021-03-17 DIAGNOSIS — Z87442 Personal history of urinary calculi: Secondary | ICD-10-CM

## 2021-03-17 DIAGNOSIS — F03A3 Unspecified dementia, mild, with mood disturbance: Secondary | ICD-10-CM | POA: Diagnosis present

## 2021-03-17 DIAGNOSIS — S72012A Unspecified intracapsular fracture of left femur, initial encounter for closed fracture: Principal | ICD-10-CM | POA: Diagnosis present

## 2021-03-17 DIAGNOSIS — Z9049 Acquired absence of other specified parts of digestive tract: Secondary | ICD-10-CM

## 2021-03-17 DIAGNOSIS — Z79899 Other long term (current) drug therapy: Secondary | ICD-10-CM

## 2021-03-17 DIAGNOSIS — Z8509 Personal history of malignant neoplasm of other digestive organs: Secondary | ICD-10-CM | POA: Diagnosis not present

## 2021-03-17 DIAGNOSIS — Z7982 Long term (current) use of aspirin: Secondary | ICD-10-CM | POA: Diagnosis not present

## 2021-03-17 DIAGNOSIS — F418 Other specified anxiety disorders: Secondary | ICD-10-CM | POA: Diagnosis present

## 2021-03-17 DIAGNOSIS — Z419 Encounter for procedure for purposes other than remedying health state, unspecified: Secondary | ICD-10-CM

## 2021-03-17 DIAGNOSIS — Z833 Family history of diabetes mellitus: Secondary | ICD-10-CM

## 2021-03-17 DIAGNOSIS — R296 Repeated falls: Secondary | ICD-10-CM | POA: Diagnosis present

## 2021-03-17 DIAGNOSIS — Z823 Family history of stroke: Secondary | ICD-10-CM

## 2021-03-17 DIAGNOSIS — Z86711 Personal history of pulmonary embolism: Secondary | ICD-10-CM | POA: Diagnosis present

## 2021-03-17 DIAGNOSIS — Z86718 Personal history of other venous thrombosis and embolism: Secondary | ICD-10-CM

## 2021-03-17 LAB — APTT: aPTT: 26 seconds (ref 24–36)

## 2021-03-17 LAB — BASIC METABOLIC PANEL
Anion gap: 9 (ref 5–15)
BUN: 24 mg/dL — ABNORMAL HIGH (ref 8–23)
CO2: 29 mmol/L (ref 22–32)
Calcium: 9 mg/dL (ref 8.9–10.3)
Chloride: 98 mmol/L (ref 98–111)
Creatinine, Ser: 0.95 mg/dL (ref 0.44–1.00)
GFR, Estimated: >60 mL/min (ref >60)
Glucose, Bld: 209 mg/dL — ABNORMAL HIGH (ref 70–99)
Potassium: 4.7 mmol/L (ref 3.5–5.1)
Sodium: 136 mmol/L (ref 135–145)

## 2021-03-17 LAB — CBC
HCT: 47.5 % — ABNORMAL HIGH (ref 36.0–46.0)
Hemoglobin: 15.3 g/dL — ABNORMAL HIGH (ref 12.0–15.0)
MCH: 29.2 pg (ref 26.0–34.0)
MCHC: 32.2 g/dL (ref 30.0–36.0)
MCV: 90.6 fL (ref 80.0–100.0)
Platelets: 268 10*3/uL (ref 150–400)
RBC: 5.24 MIL/uL — ABNORMAL HIGH (ref 3.87–5.11)
RDW: 12.3 % (ref 11.5–15.5)
WBC: 8.6 10*3/uL (ref 4.0–10.5)
nRBC: 0 % (ref 0.0–0.2)

## 2021-03-17 LAB — RESP PANEL BY RT-PCR (FLU A&B, COVID) ARPGX2
Influenza A by PCR: NEGATIVE
Influenza B by PCR: NEGATIVE
SARS Coronavirus 2 by RT PCR: NEGATIVE

## 2021-03-17 LAB — TYPE AND SCREEN
ABO/RH(D): O POS
Antibody Screen: NEGATIVE

## 2021-03-17 LAB — GLUCOSE, CAPILLARY
Glucose-Capillary: 154 mg/dL — ABNORMAL HIGH (ref 70–99)
Glucose-Capillary: 213 mg/dL — ABNORMAL HIGH (ref 70–99)

## 2021-03-17 LAB — PROTIME-INR
INR: 1.1 (ref 0.8–1.2)
Prothrombin Time: 14.1 seconds (ref 11.4–15.2)

## 2021-03-17 LAB — SURGICAL PCR SCREEN
MRSA, PCR: NEGATIVE
Staphylococcus aureus: NEGATIVE

## 2021-03-17 MED ORDER — ONDANSETRON HCL 4 MG/2ML IJ SOLN
4.0000 mg | Freq: Once | INTRAMUSCULAR | Status: AC
  Administered 2021-03-17: 4 mg via INTRAVENOUS
  Filled 2021-03-17: qty 2

## 2021-03-17 MED ORDER — METHOCARBAMOL 500 MG PO TABS
500.0000 mg | ORAL_TABLET | Freq: Three times a day (TID) | ORAL | Status: DC | PRN
  Administered 2021-03-17: 500 mg via ORAL
  Filled 2021-03-17 (×2): qty 1

## 2021-03-17 MED ORDER — BUPROPION HCL ER (SR) 150 MG PO TB12
150.0000 mg | ORAL_TABLET | Freq: Two times a day (BID) | ORAL | Status: DC
  Administered 2021-03-17 – 2021-03-21 (×7): 150 mg via ORAL
  Filled 2021-03-17 (×9): qty 1

## 2021-03-17 MED ORDER — MUPIROCIN 2 % EX OINT
1.0000 "application " | TOPICAL_OINTMENT | Freq: Two times a day (BID) | CUTANEOUS | Status: DC
  Administered 2021-03-17 – 2021-03-21 (×7): 1 via NASAL
  Filled 2021-03-17: qty 22

## 2021-03-17 MED ORDER — CITALOPRAM HYDROBROMIDE 10 MG PO TABS
10.0000 mg | ORAL_TABLET | Freq: Every day | ORAL | Status: DC
  Administered 2021-03-19 – 2021-03-21 (×3): 10 mg via ORAL
  Filled 2021-03-17 (×4): qty 1

## 2021-03-17 MED ORDER — INSULIN ASPART 100 UNIT/ML IJ SOLN
0.0000 [IU] | Freq: Every day | INTRAMUSCULAR | Status: DC
  Administered 2021-03-17: 2 [IU] via SUBCUTANEOUS
  Filled 2021-03-17: qty 1

## 2021-03-17 MED ORDER — INSULIN ASPART 100 UNIT/ML IJ SOLN
0.0000 [IU] | Freq: Three times a day (TID) | INTRAMUSCULAR | Status: DC
  Administered 2021-03-19: 2 [IU] via SUBCUTANEOUS
  Administered 2021-03-19: 1 [IU] via SUBCUTANEOUS
  Administered 2021-03-20: 2 [IU] via SUBCUTANEOUS
  Administered 2021-03-20: 3 [IU] via SUBCUTANEOUS
  Administered 2021-03-21 (×2): 2 [IU] via SUBCUTANEOUS
  Filled 2021-03-17 (×7): qty 1

## 2021-03-17 MED ORDER — LISINOPRIL 10 MG PO TABS
10.0000 mg | ORAL_TABLET | Freq: Every day | ORAL | Status: DC
  Administered 2021-03-19 – 2021-03-21 (×3): 10 mg via ORAL
  Filled 2021-03-17 (×3): qty 1

## 2021-03-17 MED ORDER — ONDANSETRON HCL 4 MG/2ML IJ SOLN
4.0000 mg | Freq: Three times a day (TID) | INTRAMUSCULAR | Status: DC | PRN
  Administered 2021-03-17: 4 mg via INTRAVENOUS
  Filled 2021-03-17 (×2): qty 2

## 2021-03-17 MED ORDER — OXYCODONE-ACETAMINOPHEN 5-325 MG PO TABS: 1.0000 | ORAL_TABLET | ORAL | Status: DC | PRN

## 2021-03-17 MED ORDER — MORPHINE SULFATE (PF) 4 MG/ML IV SOLN
4.0000 mg | Freq: Once | INTRAVENOUS | Status: DC
  Filled 2021-03-17 (×2): qty 1

## 2021-03-17 MED ORDER — CLONAZEPAM 0.5 MG PO TABS
0.5000 mg | ORAL_TABLET | Freq: Every day | ORAL | Status: DC
  Administered 2021-03-17 – 2021-03-20 (×4): 0.5 mg via ORAL
  Filled 2021-03-17 (×4): qty 1

## 2021-03-17 MED ORDER — CEFAZOLIN SODIUM-DEXTROSE 1-4 GM/50ML-% IV SOLN
1.0000 g | Freq: Once | INTRAVENOUS | Status: AC
  Administered 2021-03-18: 1 g via INTRAVENOUS
  Filled 2021-03-17: qty 50

## 2021-03-17 MED ORDER — MORPHINE SULFATE (PF) 2 MG/ML IV SOLN
1.0000 mg | INTRAVENOUS | Status: DC | PRN
  Administered 2021-03-17: 1 mg via INTRAVENOUS
  Filled 2021-03-17: qty 1

## 2021-03-17 MED ORDER — MORPHINE SULFATE (PF) 2 MG/ML IV SOLN
2.0000 mg | Freq: Once | INTRAVENOUS | Status: AC
  Administered 2021-03-17: 2 mg via INTRAVENOUS
  Filled 2021-03-17: qty 1

## 2021-03-17 MED ORDER — SENNOSIDES-DOCUSATE SODIUM 8.6-50 MG PO TABS: 1.0000 | ORAL_TABLET | Freq: Every evening | ORAL | Status: DC | PRN

## 2021-03-17 MED ORDER — HYDRALAZINE HCL 20 MG/ML IJ SOLN: 5.0000 mg | INTRAMUSCULAR | Status: DC | PRN

## 2021-03-17 MED ORDER — ACETAMINOPHEN 500 MG PO TABS
1000.0000 mg | ORAL_TABLET | Freq: Once | ORAL | Status: AC
  Administered 2021-03-17: 1000 mg via ORAL
  Filled 2021-03-17: qty 2

## 2021-03-17 MED ORDER — DONEPEZIL HCL 5 MG PO TABS
10.0000 mg | ORAL_TABLET | Freq: Every day | ORAL | Status: DC
  Administered 2021-03-17 – 2021-03-20 (×4): 10 mg via ORAL
  Filled 2021-03-17 (×4): qty 2

## 2021-03-17 MED ORDER — ACETAMINOPHEN 325 MG PO TABS: 650.0000 mg | ORAL_TABLET | Freq: Four times a day (QID) | ORAL | Status: DC | PRN

## 2021-03-17 MED ORDER — PANTOPRAZOLE SODIUM 40 MG PO TBEC
40.0000 mg | DELAYED_RELEASE_TABLET | Freq: Every day | ORAL | Status: DC
  Administered 2021-03-19 – 2021-03-21 (×3): 40 mg via ORAL
  Filled 2021-03-17 (×3): qty 1

## 2021-03-17 NOTE — ED Notes (Signed)
Patient pulled up and situated in bed

## 2021-03-17 NOTE — ED Notes (Signed)
Ortho Tech called for TLSO brace, said would arrive in approx 1.5 hours

## 2021-03-17 NOTE — ED Notes (Signed)
Patient refused pain medication at this time

## 2021-03-17 NOTE — Consult Note (Signed)
Reason for Consult: Left hip fracture Referring Physician: Dr. Jeris Penta is an 75 y.o. female.  HPI: Patient is poor historian she cannot really give a history apparently suffered a fall earlier and was unable bear weight.  She is brought to emergency room where she was found to have a displaced femoral neck fracture.  She also has additional spine fracture and right hip fracture in the past  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HCC)    Anemia    Anxiety    Chronic tension headache    Depression    GERD (gastroesophageal reflux disease)    History of pulmonary embolus (PE)    Hyperlipemia    Hypertension    IBS (irritable bowel syndrome)    Kidney stone    Low back pain    Situational stress     Past Surgical History:  Procedure Laterality Date   APPENDECTOMY     AUGMENTATION MAMMAPLASTY  1990's   BLADDER SURGERY     bladder tack   BREAST IMPLANT REMOVAL Left 07/15/2016   Procedure: REMOVAL OF EXPOSED LEFT BREAST IMPLANT;  Surgeon: Wallace Going, DO;  Location: [facility];  Service: Plastics;  Laterality: Left;   CHOLECYSTECTOMY     COLONOSCOPY     COLONOSCOPY WITH PROPOFOL N/A 07/22/2015   Procedure: COLONOSCOPY WITH PROPOFOL;  Surgeon: Manya Silvas, MD;  Location: [facility] ENDOSCOPY;  Service: Endoscopy;  Laterality: N/A;   CYSTOSCOPY/URETEROSCOPY/HOLMIUM LASER/STENT PLACEMENT Left 11/13/2014   Procedure: CYSTOSCOPY/URETEROSCOPY/HOLMIUM LASER/STENT PLACEMENT;  Surgeon: Hollice Espy, MD;  Location: [facility] ORS;  Service: Urology;  Laterality: Left;   EXTRACORPOREAL SHOCK WAVE LITHOTRIPSY Left 06/09/2018   Procedure: EXTRACORPOREAL SHOCK WAVE LITHOTRIPSY (ESWL);  Surgeon: Abbie Sons, MD;  Location: [facility] OR[facility];  Service: Urology;  Laterality: Left;   HIP SURGERY     KYPHOPLASTY N/A 06/04/2015   Procedure: KYPHOPLASTY T11, T12;  Surgeon: Hessie Knows, MD;  Location: [facility] OR[facility];  Service: Orthopedics;  Laterality: N/A;    LITHOTRIPSY     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 Her smoking use included cigarettes. She has never used smokeless tobacco. She reports that she does not drink alcohol and does not use drugs.  Allergies:  Allergies  Allergen Reactions   Sulfa Antibiotics Other (See Comments)    Does not remember reaction-from childhood    Medications: I have reviewed the patient's current medications.  Results for orders placed or performed during the hospital encounter of 03/17/21 (from the past 48 hour(s))  Basic metabolic panel     Status: Abnormal   Collection Time: 03/17/21 10:46 AM  Result Value Ref Range   Sodium 136 135 - 145 mmol/L   Potassium 4.7 3.5 - 5.1 mmol/L   Chloride 98 98 - 111 mmol/L   CO2 29 22 - 32 mmol/L   Glucose, Bld 209 (H) 70 - 99 mg/dL    Comment: Glucose reference range applies only to samples taken after fasting for at least  8 hours.   BUN 24 (H) 8 - 23 mg/dL   Creatinine, Ser 0.95 0.44 - 1.00 mg/dL   Calcium 9.0 8.9 - 10.3 mg/dL   GFR, Estimated >60 >60 mL/min    Comment: (NOTE) Calculated using the CKD-EPI Creatinine Equation (2021)    Anion gap 9 5 - 15    Comment: Performed at [facility], [address] [place], [place] [address]  CBC     Status: Abnormal   Collection Time: 03/17/21 10:46 AM  Result Value Ref Range   WBC 8.6 4.0 - 10.5 K/uL   RBC 5.24 (H) 3.87 - 5.11 MIL/uL   Hemoglobin 15.3 (H) 12.0 - 15.0 g/dL   HCT 47.5 (H) 36.0 - 46.0 %   MCV 90.6 80.0 - 100.0 fL   MCH 29.2 26.0 - 34.0 pg   MCHC 32.2 30.0 - 36.0 g/dL   RDW 12.3 11.5 - 15.5 %   Platelets 268 150 - 400 K/uL    nRBC 0.0 0.0 - 0.2 %    Comment: Performed at [facility], [address] [place], [place] [address]  Type and screen [place]     Status: None   Collection Time: 03/17/21 10:46 AM  Result Value Ref Range   ABO/RH(D) O POS    Antibody Screen NEG    Sample Expiration      03/20/2021,2359 Performed at [facility], [address] [place], [place] [address] [address]   Protime-INR     Status: None   Collection Time: 03/17/21 10:46 AM  Result Value Ref Range   Prothrombin Time 14.1 11.4 - 15.2 seconds   INR 1.1 0.8 - 1.2    Comment: (NOTE) INR goal varies based on device and disease states. Performed at [facility], [address] [place], [place] [address]   APTT     Status: None   Collection Time: 03/17/21 10:46 AM  Result Value Ref Range   aPTT 26 24 - 36 seconds    Comment: Performed at [facility], [address] [place], [place] [address]    CT HEAD WO CONTRAST (5MM)  Result Date: 03/17/2021 CLINICAL DATA:  Head trauma, mod-severe; Head trauma, minor (Age >= 65y) EXAM: CT HEAD WITHOUT CONTRAST CT CERVICAL SPINE WITHOUT CONTRAST TECHNIQUE: Multidetector CT imaging of the head and cervical spine was performed following the standard protocol without intravenous contrast. Multiplanar CT image reconstructions of the cervical spine were also generated. COMPARISON:  None. FINDINGS: CT HEAD FINDINGS Brain: No evidence of acute infarction, hemorrhage, hydrocephalus, extra-axial collection or mass lesion/mass effect. Moderate patchy white matter hypodensities, nonspecific but compatible with chronic microvascular ischemic disease. Moderate atrophy with ex vacuo ventricular dilation. Vascular: No hyperdense vessel identified. Calcific intracranial atherosclerosis. Skull: No acute fracture. Sinuses/Orbits: Clear sinuses.  Unremarkable orbits. Other: No mastoid effusions. CT CERVICAL SPINE FINDINGS Alignment: Mild retrolisthesis of C5  on C6, likely degenerative in etiology given severe degenerative changes at this level. Mild anterolisthesis of C6 on C7 and C7 on T1, also likely degenerative given marked facet arthropathy at these levels. Skull base and vertebrae: Approximately 20% height loss of the T2 vertebral body with associated superior endplate trabecular sclerosis, suggestive of acute compression fracture. Probable lucency through the anterior/superior endplate. Partial osseous fusion of the posterior left C1 arch and skull base. Corticated defect in the posterior C1 arch, likely congenital or remote. Soft tissues and spinal canal: No prevertebral fluid or swelling. No visible canal hematoma. Disc levels: Severe  degenerative disease at C5-C6. Multilevel facet arthropathy. Upper chest: Biapical pleuroparenchymal scarring. Otherwise, visualized lung apices are clear. IMPRESSION: CT cervical spine: 1. Partially imaged T2 compression fracture, likely acute. Associated 20% height loss. Recommend dedicated thoracic spine CT for further evaluation. 2. Severe multilevel degenerative change, detailed above. 3. Partial left posterior atlanto-occipital assimilation. CT head: 1. No evidence of acute intracranial abnormality. 2. Moderate chronic microvascular ischemic disease and atrophy. Electronically Signed   By: Margaretha Sheffield M.D.   On: 03/17/2021 13:15   CT Cervical Spine Wo Contrast  Result Date: 03/17/2021 CLINICAL DATA:  Head trauma, mod-severe; Head trauma, minor (Age >= 65y) EXAM: CT HEAD WITHOUT CONTRAST CT CERVICAL SPINE WITHOUT CONTRAST TECHNIQUE: Multidetector CT imaging of the head and cervical spine was performed following the standard protocol without intravenous contrast. Multiplanar CT image reconstructions of the cervical spine were also generated. COMPARISON:  None. FINDINGS: CT HEAD FINDINGS Brain: No evidence of acute infarction, hemorrhage, hydrocephalus, extra-axial collection or mass lesion/mass effect. Moderate  patchy white matter hypodensities, nonspecific but compatible with chronic microvascular ischemic disease. Moderate atrophy with ex vacuo ventricular dilation. Vascular: No hyperdense vessel identified. Calcific intracranial atherosclerosis. Skull: No acute fracture. Sinuses/Orbits: Clear sinuses.  Unremarkable orbits. Other: No mastoid effusions. CT CERVICAL SPINE FINDINGS Alignment: Mild retrolisthesis of C5 on C6, likely degenerative in etiology given severe degenerative changes at this level. Mild anterolisthesis of C6 on C7 and C7 on T1, also likely degenerative given marked facet arthropathy at these levels. Skull base and vertebrae: Approximately 20% height loss of the T2 vertebral body with associated superior endplate trabecular sclerosis, suggestive of acute compression fracture. Probable lucency through the anterior/superior endplate. Partial osseous fusion of the posterior left C1 arch and skull base. Corticated defect in the posterior C1 arch, likely congenital or remote. Soft tissues and spinal canal: No prevertebral fluid or swelling. No visible canal hematoma. Disc levels: Severe degenerative disease at C5-C6. Multilevel facet arthropathy. Upper chest: Biapical pleuroparenchymal scarring. Otherwise, visualized lung apices are clear. IMPRESSION: CT cervical spine: 1. Partially imaged T2 compression fracture, likely acute. Associated 20% height loss. Recommend dedicated thoracic spine CT for further evaluation. 2. Severe multilevel degenerative change, detailed above. 3. Partial left posterior atlanto-occipital assimilation. CT head: 1. No evidence of acute intracranial abnormality. 2. Moderate chronic microvascular ischemic disease and atrophy. Electronically Signed   By: Margaretha Sheffield M.D.   On: 03/17/2021 13:15   CT Lumbar Spine Wo Contrast  Result Date: 03/17/2021 CLINICAL DATA:  Low back pain, trauma, multiple falls EXAM: CT LUMBAR SPINE WITHOUT CONTRAST TECHNIQUE: Multidetector CT  imaging of the lumbar spine was performed without intravenous contrast administration. Multiplanar CT image reconstructions were also generated. COMPARISON:  CT 01/10/2021 FINDINGS: Segmentation: 5 lumbar type vertebrae. Alignment: Normal Vertebrae: Prior vertebroplasties at T11 and T12 without evidence of progressive height loss. There is an acute L1 burst fracture with approximately 60% height loss and bony retropulsion measuring up to 5 mm. Unchanged compression deformities of L4 and L5, with mild retro portion of the superior endplate of L4. Paraspinal and other soft tissues: Prior cholecystectomy. Partially visualized pneumobilia, likely related to reported prior Whipple procedure. There is a nonobstructive 5 mm stone in the left kidney. Unchanged low-density left adrenal nodule, statistically likely to be an adenoma. Disc levels: There is moderate to severe disc height loss at L5-S1. There is disc bulging at L3-L4, L4-L5, and L5-S1 along with severe bilateral facet arthropathy resulting in varying degrees spinal canal and neural foraminal narrowing, most severe canal  narrowing at L3-L4 and worst neural foraminal narrowing at L5-S1. IMPRESSION: Acute L1 burst fracture with approximately 60% height loss and mild bony retropulsion measuring up to 5 mm. Unchanged prior vertebroplasties at T11 and T12 without evidence of progressive height loss. Unchanged compression deformities of L4 and L5. Multilevel degenerative disc disease and facet arthropathy with disc bulging in the lower lumbar spine resulting in varying degrees of spinal canal or neural foraminal narrowing, worst at L3-L4 and L5-S1. Prior Whipple procedure with pneumobilia. Electronically Signed   By: Maurine Simmering M.D.   On: 03/17/2021 13:11   DG Chest Portable 1 View  Result Date: 03/17/2021 CLINICAL DATA:  Preop for hip fracture EXAM: PORTABLE CHEST 1 VIEW COMPARISON:  01/16/2014 FINDINGS: Numerous leads and wires project over the chest. Patient  rotated minimally right. Midline trachea. Normal heart size and mediastinal contours for age. No pleural effusion or pneumothorax. Clear lungs. Osteopenia. Vertebral augmentation at 2 lower thoracic levels. Surgical clips in the upper abdomen. IMPRESSION: No acute cardiopulmonary disease. Electronically Signed   By: Abigail Miyamoto M.D.   On: 03/17/2021 14:53   DG HIP UNILAT WITH PELVIS 2-3 VIEWS LEFT  Result Date: 03/17/2021 CLINICAL DATA:  Fall, left hip pain EXAM: DG HIP (WITH OR WITHOUT PELVIS) 2-3V LEFT COMPARISON:  01/10/2021 FINDINGS: Acute left femoral neck fracture with approximately 2-3 cm of proximal displacement. Left femoral head remains aligned without dislocation. No definite fracture involvement of the intertrochanteric aspect of the proximal femur. Bones appear demineralized. Prior right hip hemiarthroplasty, grossly intact. IMPRESSION: Acute moderately displaced left femoral neck fracture. Electronically Signed   By: Davina Poke D.O.   On: 03/17/2021 12:45    Review of Systems Blood pressure 117/77, pulse 78, temperature 98.3 F (36.8 C), temperature source Oral, resp. rate 16, height 4\' 10"  (1.473 m), weight 40.8 kg, SpO2 97 %. Physical Exam Left leg is shortened she is able to flex extend the toes.  Palpable pulses Assessment/Plan: Displaced femoral neck fracture without significant osteoarthritis Plan cemented hemiarthroplasty anterior approach  Hessie Knows 03/17/2021, 3:06 PM

## 2021-03-17 NOTE — H&P (Signed)
History and Physical    Kimberly Meyer EXH:[id number] DOB: Feb 27, 1946 DOA: 03/17/2021  Referring MD/NP/PA:   PCP: Marinda Elk, MD   Patient coming from:  The patient is coming from SNF  Chief Complaint: fall, pain left hip and back  HPI: Kimberly Meyer is a 75 y.o. female with medical history significant of hypertension, hyperlipidemia, diabetes mellitus, GERD, depression, anxiety, kidney stone, IBS, remote PE not on anticoagulants, ambulatory carcinoma, colitis, dementia, who presents with fall, left hip pain and back pain.  Per report, patient has had multiple falls over the past 2 weeks.  This is likely due to increased weakness.  Most recently he fell last night and developed pain in her back and the left hip.  Left leg is shortened. He has not been able to ambulate since her recent fall.  Patient seems to have severe lower back pain and left hip pain.  She denies numbness in extremities. Patient denies chest pain, shortness breath, coughing, fever or chills.  No nausea, vomiting, diarrhea or abdominal pain.  No symptoms of UTI.  Patient has dementia, but she is alert, mental status seem to be at baseline.  ED Course: pt was found to have WBC 8.6, pending COVID-19 PCR, electrolytes renal function okay, temperature normal, blood pressure 117/77, heart rate 78, RR 16, oxygen saturation 97% on room air.  Chest x-ray negative.  CT head is negative.  CT of C-spine is negative for C-spine fracture. Pt was found to have T-spine compression fracture, L1 burst fracture, left femoral neck fracture.  Pt is admitted to [facility] bed as inpatient.  Dr. Rudene Christians of Ortho and Dr. Lacinda Axon of neurosurgery were consulted.   X-ray of left hip: Acute moderately displaced left femoral neck fracture  CT-T spin: 1. Mild superior endplate compression deformities at T2, T3 and T4, likely acute. Each of these fractures is associated with approximately 20% loss of vertebral body height. 2. Superior endplate  compression deformity at T9 does not appear acute. 3. Previous spinal augmentation for fractures at T11 and T12. Known burst fracture at L1, described on earlier lumbar spine CT. 4. Mild multilevel spondylosis with small disc protrusions. No high-grade spinal stenosis.  CT-L spin: Acute L1 burst fracture with approximately 60% height loss and mild bony retropulsion measuring up to 5 mm.   Unchanged prior vertebroplasties at T11 and T12 without evidence of progressive height loss.   Unchanged compression deformities of L4 and L5.   Multilevel degenerative disc disease and facet arthropathy with disc bulging in the lower lumbar spine resulting in varying degrees of spinal canal or neural foraminal narrowing, worst at L3-L4 and L5-S1.   Prior Whipple procedure with pneumobilia.        Review of Systems:   General: no fevers, chills, no body weight gain, has fatigue HEENT: no blurry vision, hearing changes or sore throat Respiratory: no dyspnea, coughing, wheezing CV: no chest pain, no palpitations GI: no nausea, vomiting, abdominal pain, diarrhea, constipation GU: no dysuria, burning on urination, increased urinary frequency, hematuria  Ext: no leg edema Neuro: no unilateral weakness, numbness, or tingling, no vision change or hearing loss. Has fall Skin: no rash, no skin tear. MSK: has back pain and left hip pain. Heme: No easy bruising.  Travel history: No recent long distant travel.  Allergy:  Allergies  Allergen Reactions   Sulfa Antibiotics Other (See Comments)    Does not remember reaction-from childhood    Past Medical History:  Diagnosis Date  Ampullary carcinoma (HCC)    Anemia    Anxiety    Chronic tension headache    Depression    GERD (gastroesophageal reflux disease)    History of pulmonary embolus (PE)    Hyperlipemia    Hypertension    IBS (irritable bowel syndrome)    Kidney stone    Low back pain    Situational stress     Past Surgical  History:  Procedure Laterality Date   APPENDECTOMY     AUGMENTATION MAMMAPLASTY  1990's   BLADDER SURGERY     bladder tack   BREAST IMPLANT REMOVAL Left 07/15/2016   Procedure: REMOVAL OF EXPOSED LEFT BREAST IMPLANT;  Surgeon: Wallace Going, DO;  Location: [facility];  Service: Plastics;  Laterality: Left;   CHOLECYSTECTOMY     COLONOSCOPY     COLONOSCOPY WITH PROPOFOL N/A 07/22/2015   Procedure: COLONOSCOPY WITH PROPOFOL;  Surgeon: Manya Silvas, MD;  Location: [facility] ENDOSCOPY;  Service: Endoscopy;  Laterality: N/A;   CYSTOSCOPY/URETEROSCOPY/HOLMIUM LASER/STENT PLACEMENT Left 11/13/2014   Procedure: CYSTOSCOPY/URETEROSCOPY/HOLMIUM LASER/STENT PLACEMENT;  Surgeon: Hollice Espy, MD;  Location: [facility] ORS;  Service: Urology;  Laterality: Left;   EXTRACORPOREAL SHOCK WAVE LITHOTRIPSY Left 06/09/2018   Procedure: EXTRACORPOREAL SHOCK WAVE LITHOTRIPSY (ESWL);  Surgeon: Abbie Sons, MD;  Location: [facility] OR[facility];  Service: Urology;  Laterality: Left;   HIP SURGERY     KYPHOPLASTY N/A 06/04/2015   Procedure: KYPHOPLASTY T11, T12;  Surgeon: Hessie Knows, MD;  Location: [facility] OR[facility];  Service: Orthopedics;  Laterality: N/A;   LITHOTRIPSY     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 Her smoking use included cigarettes. She has never used smokeless tobacco. She reports that she does not drink alcohol and does not use drugs.  Family History:  Family History  Problem Relation Age of Onset   Alzheimer's disease Father    Kidney Stones Father    Stroke Mother    Diabetes type II Mother    Breast cancer Neg Hx      Prior to Admission medications   Medication Sig Start Date End Date Taking? Authorizing Provider  alendronate (FOSAMAX) 70 MG tablet  06/20/18   [provider]  Ascorbic Acid (VITAMIN  C) 1000 MG tablet Take 1,000 mg by mouth daily.    [provider]  aspirin 81 MG tablet Take 81 mg by mouth daily.    [provider]  azelastine (ASTELIN) 0.1 % nasal spray Place into both nostrils 2 (two) times daily. Use in each nostril as directed    [provider]  Beta Carotene (VITAMIN A) 25000 UNIT capsule Take 25,000 Units by mouth daily.    [provider]  buPROPion (WELLBUTRIN SR) 150 MG 12 hr tablet Take by mouth. 09/27/14   [provider]  docusate sodium (COLACE) 100 MG capsule Take 1 tablet once or twice daily as needed for constipation while taking narcotic pain medicine 10/31/18   Hinda Kehr, MD  escitalopram (LEXAPRO) 10 MG tablet Take 10 mg by mouth daily.    [provider]  esomeprazole (NEXIUM) 20 MG capsule Take 20 mg by mouth daily at 12 noon.    [provider]  fluticasone (FLONASE) 50 MCG/ACT nasal spray Place into the  nose. 09/27/14   [provider]  levocetirizine (XYZAL) 5 MG tablet Take by mouth.    [provider]  lidocaine (LIDODERM) 5 % Place 1 patch onto the skin every 12 (twelve) hours. Remove & Discard patch within 12 hours or as directed by MD 06/11/20 06/11/21  Sable Feil, PA-C  lisinopril (PRINIVIL,ZESTRIL) 10 MG tablet Take by mouth. 09/27/14   [provider]  Multiple Vitamin (MULTI-VITAMINS) TABS Take by mouth.    [provider]  niacin 500 MG tablet Take by mouth.    [provider]  Pancrelipase, Lip-Prot-Amyl, (ZENPEP) 20000 UNITS CPEP TAKE AS DIRECTED BEFORE MEALS AND SNACKS 09/27/14   [provider]  Phenazopyridine HCl (AZO URINARY PAIN PO) Take by mouth.    [provider]  Potassium Citrate 15 MEQ (1620 MG) TBCR Take 1 tablet by mouth 2 (two) times daily. 04/28/18   Hollice Espy, MD  psyllium (METAMUCIL) 58.6 % packet Take 1 packet by mouth daily.    [provider]  tamsulosin (FLOMAX) 0.4 MG CAPS capsule  Take 1 tablet by mouth daily until you pass the kidney stone or no longer have symptoms 10/31/18   Hinda Kehr, MD  valACYclovir (VALTREX) 1000 MG tablet Take 1,000 mg by mouth 2 (two) times daily.    [provider]  vitamin B-12 (CYANOCOBALAMIN) 1000 MCG tablet Take 1,000 mcg by mouth daily.    [provider]    Physical Exam: Vitals:   03/17/21 1047 03/17/21 1049 03/17/21 1607  BP: 117/77  102/73  Pulse: 78  73  Resp: 16  16  Temp: 98.3 F (36.8 C)  (!) 97.4 F (36.3 C)  TempSrc: Oral    SpO2: 97%  100%  Weight:  40.8 kg   Height:  4\' 10"  (1.473 m)    General: Not in acute distress HEENT:       Eyes: PERRL, EOMI, no scleral icterus.       ENT: No discharge from the ears and nose, no pharynx injection, no tonsillar enlargement.        Neck: No JVD, no bruit, no mass felt. Heme: No neck lymph node enlargement. Cardiac: S1/S2, RRR, No murmurs, No gallops or rubs. Respiratory: No rales, wheezing, rhonchi or rubs. GI: Soft, nondistended, nontender, no rebound pain, no organomegaly, BS present. GU: No hematuria Ext: No pitting leg edema bilaterally. 1+DP/PT pulse bilaterally. Musculoskeletal: No joint deformities, No joint redness or warmth, no limitation of ROM in spin. Skin: No rashes.  Neuro: Alert, oriented X3, cranial nerves II-XII grossly intact, moves all extremities normally. Muscle strength 5/5 in all extremities, sensation to light touch intact. Brachial reflex 2+ bilaterally. Knee reflex 1+ bilaterally. Negative Babinski's sign. Normal finger to nose test. Psych: Patient is not psychotic, no suicidal or hemocidal ideation.  Labs on Admission: I have personally reviewed following labs and imaging studies  CBC: Recent Labs  Lab 03/17/21 1046  WBC 8.6  HGB 15.3*  HCT 47.5*  MCV 90.6  PLT 865   Basic Metabolic Panel: Recent Labs  Lab 03/17/21 1046  NA 136  K 4.7  CL 98  CO2 29  GLUCOSE 209*  BUN 24*  CREATININE 0.95  CALCIUM 9.0    GFR: Estimated Creatinine Clearance: 31.9 mL/min (by C-G formula based on SCr of 0.95 mg/dL). Liver Function Tests: No results for input(s): AST, ALT, ALKPHOS, BILITOT, PROT, ALBUMIN in the last 168 hours. No results for input(s): LIPASE, AMYLASE in the last 168 hours. No  results for input(s): AMMONIA in the last 168 hours. Coagulation Profile: Recent Labs  Lab 03/17/21 1046  INR 1.1   Cardiac Enzymes: No results for input(s): CKTOTAL, CKMB, CKMBINDEX, TROPONINI in the last 168 hours. BNP (last 3 results) No results for input(s): PROBNP in the last 8760 hours. HbA1C: No results for input(s): HGBA1C in the last 72 hours. CBG: Recent Labs  Lab 03/17/21 1632  GLUCAP 154*   Lipid Profile: No results for input(s): CHOL, HDL, LDLCALC, TRIG, CHOLHDL, LDLDIRECT in the last 72 hours. Thyroid Function Tests: No results for input(s): TSH, T4TOTAL, FREET4, T3FREE, THYROIDAB in the last 72 hours. Anemia Panel: No results for input(s): VITAMINB12, FOLATE, FERRITIN, TIBC, IRON, RETICCTPCT in the last 72 hours. Urine analysis:    Component Value Date/Time   COLORURINE YELLOW (A) 10/30/2018 2232   APPEARANCEUR Clear 11/02/2018 1044   LABSPEC 1.017 10/30/2018 2232   LABSPEC 1.013 01/16/2014 2104   PHURINE 5.0 10/30/2018 2232   GLUCOSEU Negative 11/02/2018 1044   GLUCOSEU Negative 01/16/2014 2104   HGBUR LARGE (A) 10/30/2018 2232   BILIRUBINUR Negative 11/02/2018 1044   BILIRUBINUR Negative 01/16/2014 2104   [place] 10/30/2018 2232   PROTEINUR Negative 11/02/2018 1044   PROTEINUR 30 (A) 10/30/2018 2232   NITRITE Negative 11/02/2018 1044   NITRITE NEGATIVE 10/30/2018 2232   LEUKOCYTESUR Trace (A) 11/02/2018 1044   LEUKOCYTESUR LARGE (A) 10/30/2018 2232   LEUKOCYTESUR Negative 01/16/2014 2104   Sepsis Labs: @LABRCNTIP (procalcitonin:4,lacticidven:4) ) Recent Results (from the past 240 hour(s))  Resp Panel by RT-PCR (Flu A&B, Covid) Nasopharyngeal Swab     Status: None    Collection Time: 03/17/21  3:37 PM   Specimen: Nasopharyngeal Swab; Nasopharyngeal(NP) swabs in vial transport medium  Result Value Ref Range Status   SARS Coronavirus 2 by RT PCR NEGATIVE NEGATIVE Final    Comment: (NOTE) SARS-CoV-2 target nucleic acids are NOT DETECTED.  The SARS-CoV-2 RNA is generally detectable in upper respiratory specimens during the acute phase of infection. The lowest concentration of SARS-CoV-2 viral copies this assay can detect is 138 copies/mL. A negative result does not preclude SARS-Cov-2 infection and should not be used as the sole basis for treatment or other patient management decisions. A negative result may occur with  improper specimen collection/handling, submission of specimen other than nasopharyngeal swab, presence of viral mutation(s) within the areas targeted by this assay, and inadequate number of viral copies(<138 copies/mL). A negative result must be combined with clinical observations, patient history, and epidemiological information. The expected result is Negative.  Fact Sheet for Patients:  EntrepreneurPulse.com.au  Fact Sheet for Healthcare Providers:  IncredibleEmployment.be  This test is no t yet approved or cleared by the [place] FDA and  has been authorized for detection and/or diagnosis of SARS-CoV-2 by FDA under an Emergency Use Authorization (EUA). This EUA will remain  in effect (meaning this test can be used) for the duration of the COVID-19 declaration under Section 564(b)(1) of the Act, 21 U.S.C.section 360bbb-3(b)(1), unless the authorization is terminated  or revoked sooner.       Influenza A by PCR NEGATIVE NEGATIVE Final   Influenza B by PCR NEGATIVE NEGATIVE Final    Comment: (NOTE) The Xpert Xpress SARS-CoV-2/FLU/RSV plus assay is intended as an aid in the diagnosis of influenza from Nasopharyngeal swab specimens and should not be used as a sole basis for treatment.  Nasal washings and aspirates are unacceptable for Xpert Xpress SARS-CoV-2/FLU/RSV testing.  Fact Sheet for Patients: EntrepreneurPulse.com.au  Fact Sheet for  Healthcare Providers: IncredibleEmployment.be  This test is not yet approved or cleared by the [place] and has been authorized for detection and/or diagnosis of SARS-CoV-2 by FDA under an Emergency Use Authorization (EUA). This EUA will remain in effect (meaning this test can be used) for the duration of the COVID-19 declaration under Section 564(b)(1) of the Act, 21 U.S.C. section 360bbb-3(b)(1), unless the authorization is terminated or revoked.  Performed at [facility], [address] [place], [place] [address]      Radiological Exams on Admission: CT HEAD WO CONTRAST (5MM)  Result Date: 03/17/2021 CLINICAL DATA:  Head trauma, mod-severe; Head trauma, minor (Age >= 65y) EXAM: CT HEAD WITHOUT CONTRAST CT CERVICAL SPINE WITHOUT CONTRAST TECHNIQUE: Multidetector CT imaging of the head and cervical spine was performed following the standard protocol without intravenous contrast. Multiplanar CT image reconstructions of the cervical spine were also generated. COMPARISON:  None. FINDINGS: CT HEAD FINDINGS Brain: No evidence of acute infarction, hemorrhage, hydrocephalus, extra-axial collection or mass lesion/mass effect. Moderate patchy white matter hypodensities, nonspecific but compatible with chronic microvascular ischemic disease. Moderate atrophy with ex vacuo ventricular dilation. Vascular: No hyperdense vessel identified. Calcific intracranial atherosclerosis. Skull: No acute fracture. Sinuses/Orbits: Clear sinuses.  Unremarkable orbits. Other: No mastoid effusions. CT CERVICAL SPINE FINDINGS Alignment: Mild retrolisthesis of C5 on C6, likely degenerative in etiology given severe degenerative changes at this level. Mild anterolisthesis of C6 on C7 and C7 on T1, also likely  degenerative given marked facet arthropathy at these levels. Skull base and vertebrae: Approximately 20% height loss of the T2 vertebral body with associated superior endplate trabecular sclerosis, suggestive of acute compression fracture. Probable lucency through the anterior/superior endplate. Partial osseous fusion of the posterior left C1 arch and skull base. Corticated defect in the posterior C1 arch, likely congenital or remote. Soft tissues and spinal canal: No prevertebral fluid or swelling. No visible canal hematoma. Disc levels: Severe degenerative disease at C5-C6. Multilevel facet arthropathy. Upper chest: Biapical pleuroparenchymal scarring. Otherwise, visualized lung apices are clear. IMPRESSION: CT cervical spine: 1. Partially imaged T2 compression fracture, likely acute. Associated 20% height loss. Recommend dedicated thoracic spine CT for further evaluation. 2. Severe multilevel degenerative change, detailed above. 3. Partial left posterior atlanto-occipital assimilation. CT head: 1. No evidence of acute intracranial abnormality. 2. Moderate chronic microvascular ischemic disease and atrophy. Electronically Signed   By: Margaretha Sheffield M.D.   On: 03/17/2021 13:15   CT Cervical Spine Wo Contrast  Result Date: 03/17/2021 CLINICAL DATA:  Head trauma, mod-severe; Head trauma, minor (Age >= 65y) EXAM: CT HEAD WITHOUT CONTRAST CT CERVICAL SPINE WITHOUT CONTRAST TECHNIQUE: Multidetector CT imaging of the head and cervical spine was performed following the standard protocol without intravenous contrast. Multiplanar CT image reconstructions of the cervical spine were also generated. COMPARISON:  None. FINDINGS: CT HEAD FINDINGS Brain: No evidence of acute infarction, hemorrhage, hydrocephalus, extra-axial collection or mass lesion/mass effect. Moderate patchy white matter hypodensities, nonspecific but compatible with chronic microvascular ischemic disease. Moderate atrophy with ex vacuo ventricular  dilation. Vascular: No hyperdense vessel identified. Calcific intracranial atherosclerosis. Skull: No acute fracture. Sinuses/Orbits: Clear sinuses.  Unremarkable orbits. Other: No mastoid effusions. CT CERVICAL SPINE FINDINGS Alignment: Mild retrolisthesis of C5 on C6, likely degenerative in etiology given severe degenerative changes at this level. Mild anterolisthesis of C6 on C7 and C7 on T1, also likely degenerative given marked facet arthropathy at these levels. Skull base and vertebrae: Approximately 20% height loss of the T2 vertebral body with associated  superior endplate trabecular sclerosis, suggestive of acute compression fracture. Probable lucency through the anterior/superior endplate. Partial osseous fusion of the posterior left C1 arch and skull base. Corticated defect in the posterior C1 arch, likely congenital or remote. Soft tissues and spinal canal: No prevertebral fluid or swelling. No visible canal hematoma. Disc levels: Severe degenerative disease at C5-C6. Multilevel facet arthropathy. Upper chest: Biapical pleuroparenchymal scarring. Otherwise, visualized lung apices are clear. IMPRESSION: CT cervical spine: 1. Partially imaged T2 compression fracture, likely acute. Associated 20% height loss. Recommend dedicated thoracic spine CT for further evaluation. 2. Severe multilevel degenerative change, detailed above. 3. Partial left posterior atlanto-occipital assimilation. CT head: 1. No evidence of acute intracranial abnormality. 2. Moderate chronic microvascular ischemic disease and atrophy. Electronically Signed   By: Margaretha Sheffield M.D.   On: 03/17/2021 13:15   CT Thoracic Spine Wo Contrast  Result Date: 03/17/2021 CLINICAL DATA:  Back pain. Multiple falls over the last 2 weeks with increasing weakness. EXAM: CT THORACIC SPINE WITHOUT CONTRAST TECHNIQUE: Multidetector CT images of the thoracic were obtained using the standard protocol without intravenous contrast. COMPARISON:  CTs of  the cervical and lumbar spine earlier today. Abdominopelvic CT 01/10/2021, thoracic spine MRI 05/10/2015 and thoracic radiographs 06/04/2015. FINDINGS: Alignment: Near anatomic. There is a slight degenerative anterolisthesis at C6-7 and C7-T1. Vertebrae: There are multiple thoracic compression deformities. As seen on earlier cervical spine CT, there is a mild superior endplate compression fracture at T2 with approximately 20% loss of vertebral body height and endplate sclerosis. Similar mild superior endplate compression fractures are present at T3 and T4. There is a superior endplate compression fracture at T9 which does not appear acute. Patient is status post spinal augmentation at T11 and T12. A burst fracture at L1 is noted, described on earlier lumbar spine CT. The posterior elements appear intact. Paraspinal and other soft tissues: No significant paraspinal or epidural fluid collections are identified. Mild pulmonary emphysematous changes with scattered calcified granulomas. Disc levels: Multilevel thoracic spondylosis with disc and endplate degeneration and scattered mild facet degenerative changes. There are small disc protrusions, most notable centrally at T4-5, T5-6 and T6-7. No large disc herniation or high-grade spinal stenosis identified. IMPRESSION: 1. Mild superior endplate compression deformities at T2, T3 and T4, likely acute. Each of these fractures is associated with approximately 20% loss of vertebral body height. 2. Superior endplate compression deformity at T9 does not appear acute. 3. Previous spinal augmentation for fractures at T11 and T12. Known burst fracture at L1, described on earlier lumbar spine CT. 4. Mild multilevel spondylosis with small disc protrusions. No high-grade spinal stenosis. Electronically Signed   By: Richardean Sale M.D.   On: 03/17/2021 15:15   CT Lumbar Spine Wo Contrast  Result Date: 03/17/2021 CLINICAL DATA:  Low back pain, trauma, multiple falls EXAM: CT  LUMBAR SPINE WITHOUT CONTRAST TECHNIQUE: Multidetector CT imaging of the lumbar spine was performed without intravenous contrast administration. Multiplanar CT image reconstructions were also generated. COMPARISON:  CT 01/10/2021 FINDINGS: Segmentation: 5 lumbar type vertebrae. Alignment: Normal Vertebrae: Prior vertebroplasties at T11 and T12 without evidence of progressive height loss. There is an acute L1 burst fracture with approximately 60% height loss and bony retropulsion measuring up to 5 mm. Unchanged compression deformities of L4 and L5, with mild retro portion of the superior endplate of L4. Paraspinal and other soft tissues: Prior cholecystectomy. Partially visualized pneumobilia, likely related to reported prior Whipple procedure. There is a nonobstructive 5 mm stone in the left kidney. Unchanged  low-density left adrenal nodule, statistically likely to be an adenoma. Disc levels: There is moderate to severe disc height loss at L5-S1. There is disc bulging at L3-L4, L4-L5, and L5-S1 along with severe bilateral facet arthropathy resulting in varying degrees spinal canal and neural foraminal narrowing, most severe canal narrowing at L3-L4 and worst neural foraminal narrowing at L5-S1. IMPRESSION: Acute L1 burst fracture with approximately 60% height loss and mild bony retropulsion measuring up to 5 mm. Unchanged prior vertebroplasties at T11 and T12 without evidence of progressive height loss. Unchanged compression deformities of L4 and L5. Multilevel degenerative disc disease and facet arthropathy with disc bulging in the lower lumbar spine resulting in varying degrees of spinal canal or neural foraminal narrowing, worst at L3-L4 and L5-S1. Prior Whipple procedure with pneumobilia. Electronically Signed   By: Maurine Simmering M.D.   On: 03/17/2021 13:11   DG Chest Portable 1 View  Result Date: 03/17/2021 CLINICAL DATA:  Preop for hip fracture EXAM: PORTABLE CHEST 1 VIEW COMPARISON:  01/16/2014 FINDINGS:  Numerous leads and wires project over the chest. Patient rotated minimally right. Midline trachea. Normal heart size and mediastinal contours for age. No pleural effusion or pneumothorax. Clear lungs. Osteopenia. Vertebral augmentation at 2 lower thoracic levels. Surgical clips in the upper abdomen. IMPRESSION: No acute cardiopulmonary disease. Electronically Signed   By: Abigail Miyamoto M.D.   On: 03/17/2021 14:53   DG HIP UNILAT WITH PELVIS 2-3 VIEWS LEFT  Result Date: 03/17/2021 CLINICAL DATA:  Fall, left hip pain EXAM: DG HIP (WITH OR WITHOUT PELVIS) 2-3V LEFT COMPARISON:  01/10/2021 FINDINGS: Acute left femoral neck fracture with approximately 2-3 cm of proximal displacement. Left femoral head remains aligned without dislocation. No definite fracture involvement of the intertrochanteric aspect of the proximal femur. Bones appear demineralized. Prior right hip hemiarthroplasty, grossly intact. IMPRESSION: Acute moderately displaced left femoral neck fracture. Electronically Signed   By: Davina Poke D.O.   On: 03/17/2021 12:45     EKG: I have personally reviewed.  Sinus rhythm, QTC 478, LAD, poor R wave progression, low voltage.   Assessment/Plan Principal Problem:   Closed left hip fracture (HCC) Active Problems:   Acid reflux   Pulmonary embolism (HCC)   HTN (hypertension)   Diabetes mellitus without complication (HCC)   Dementia (HCC)   Depression with anxiety   Fracture of lumbar spine (HCC)   Fracture of thoracic spine (HCC)   Closed left hip fracture and displaced femoral neck fracture: Dr. Rudene Christians of ortho is consulted.  Likely surgery tomorrow.  - will admit to Med-surg bed - Pain control: morphine prn and percocet - When necessary Zofran for nausea - Robaxin for muscle spasm - type and cross - INR/PTT - PT/OT when able to (not ordered now)  Fracture of lumbar spine and fracture of thoracic spine: Dr. Lacinda Axon of neurosurgery is consulted. Applied TLSO.  -Pain control as  above -Follow-up with Dr. Jonathon Jordan recommendations  Hx of pulmonary embolism [facility]): Per patient's son, patient had removed pulmonary embolism after surgery in the past.  Currently patient does not have any chest pain or shortness breath. -No signs of PE  HTN (hypertension) -IV hydralazine as needed -Lisinopril  Diabetes mellitus without complication ([place]): H0W 7.6 on 1/22.  Patient is not taking medications currently.  Blood sugar 209. -Start sliding scale insulin  Dementia (HCC) -Donepezil  Depression with anxiety -Continue home medications   Perioperative Cardiac Risk: pt has multiple comorbidities, as listed above.  Patient does not have history of  CAD or CHF. Currently patient does not have chest pain, shortness of breath, palpitation, leg edema. EKG has no acute change.  Patient has remote provoked PE, but currently no signs of PE. At this time point, no further work up is needed. Patient's GUPTA score perioperative myocardial infarction or cardaic arrest is 2.13 %. Revised Cardiac Risk Index [place] [facility] Criteria) 0.9%. I discussed the risk with patient's son, and he would like to proceed for surgery.  DVT ppx: SCD Code Status: DNR per her son Family Communication:  Yes, patient's  son by phone Disposition Plan:  Anticipate discharge back to previous environment, SNF Consults called:  Dr. Rudene Christians of Ortho and Dr. Lacinda Axon of neurosurgery were consulted. Admission status and Level of care: Med-Surg:  as inpt        Status is: Inpatient  Remains inpatient appropriate because: pt will need sugery          Date of Service 03/17/2021    Ivor Costa [facility]   If 7PM-7AM, please contact night-coverage [email or web address] 03/17/2021, 4:59 PM

## 2021-03-17 NOTE — ED Provider Notes (Signed)
[facility] Emergency Department Provider Note  ____________________________________________   Event Date/Time   First MD Initiated Contact with Patient 03/17/21 1129     (approximate)  I have reviewed the triage vital signs and the nursing notes.   HISTORY  Chief Complaint Fall    HPI Kimberly Meyer is a 75 y.o. female  with PMHx below here with hip pain and back pain. Pt slightly confused from mild dementia at baseline. She reportedly has had multiple falls over the past 2 weeks or so, with increased weakness. Most recently, she fell last night/this early AM and has since c/o severe lower back pain and left hip pain. Has not been able to ambulate since her recent fall. To me, she c/o 10/10 lower back pain. Denies any lower ext numbness, weakness. Pain is aching, throbbing, worse with any movement. No alleviating factors. Also c/o left hip pain with movement but none at rest. She is not on blood thinners per report.       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/07/2015   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   Chronic tension-type headache, intractable 11/07/2014   Acid reflux 11/07/2014   HLD (hyperlipidemia) 11/07/2014   BP (high blood pressure) 11/07/2014   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   Calculus of kidney 11/07/2014   PE (pulmonary embolism)  11/07/2014   Functional bowel disorder 11/07/2014   History of artificial joint 03/06/2014   Acute situational disturbance 02/21/2014   Pulmonary embolism ([place]) 02/27/2009    Past Surgical History:  Procedure Laterality Date   APPENDECTOMY     AUGMENTATION MAMMAPLASTY  1990's   BLADDER SURGERY     bladder tack   BREAST IMPLANT REMOVAL Left 07/15/2016   Procedure: REMOVAL OF EXPOSED LEFT BREAST IMPLANT;  Surgeon: Wallace Going, DO;  Location: [place];  Service: Plastics;  Laterality: Left;   CHOLECYSTECTOMY     COLONOSCOPY     COLONOSCOPY WITH PROPOFOL N/A 07/22/2015   Procedure: COLONOSCOPY WITH PROPOFOL;  Surgeon: Manya Silvas, MD;  Location: [facility] ENDOSCOPY;  Service: Endoscopy;  Laterality: N/A;   CYSTOSCOPY/URETEROSCOPY/HOLMIUM LASER/STENT PLACEMENT Left 11/13/2014   Procedure: CYSTOSCOPY/URETEROSCOPY/HOLMIUM LASER/STENT PLACEMENT;  Surgeon: Hollice Espy, MD;  Location: [facility] ORS;  Service: Urology;  Laterality: Left;   EXTRACORPOREAL SHOCK WAVE LITHOTRIPSY Left 06/09/2018   Procedure: EXTRACORPOREAL SHOCK WAVE LITHOTRIPSY (ESWL);  Surgeon: Abbie Sons, MD;  Location: [facility] ORS;  Service: Urology;  Laterality: Left;   HIP SURGERY     KYPHOPLASTY N/A 06/04/2015   Procedure: KYPHOPLASTY T11, T12;  Surgeon: Hessie Knows, MD;  Location: [facility] ORS;  Service: Orthopedics;  Laterality: N/A;   LITHOTRIPSY     PANCREATICODUODENECTOMY     secondary to ampullary carcinoma   PLACEMENT OF BREAST IMPLANTS  TUBAL LIGATION     VENTRAL HERNIA REPAIR     WHIPPLE PROCEDURE      Prior to Admission medications   Medication Sig Start Date End Date Taking? Authorizing Provider  alendronate (FOSAMAX) 70 MG tablet  06/20/18   [provider]  Ascorbic Acid (VITAMIN C) 1000 MG tablet Take 1,000 mg by mouth daily.    [provider]  aspirin 81 MG tablet Take 81 mg by mouth daily.    [provider]  azelastine (ASTELIN) 0.1 % nasal spray Place  into both nostrils 2 (two) times daily. Use in each nostril as directed    [provider]  Beta Carotene (VITAMIN A) 25000 UNIT capsule Take 25,000 Units by mouth daily.    [provider]  buPROPion (WELLBUTRIN SR) 150 MG 12 hr tablet Take by mouth. 09/27/14   [provider]  docusate sodium (COLACE) 100 MG capsule Take 1 tablet once or twice daily as needed for constipation while taking narcotic pain medicine 10/31/18   Hinda Kehr, MD  donepezil (ARICEPT) 5 MG tablet Take 5 mg by mouth at bedtime.    [provider]  escitalopram (LEXAPRO) 10 MG tablet Take 10 mg by mouth daily.    [provider]  esomeprazole (NEXIUM) 20 MG capsule Take 20 mg by mouth daily at 12 noon.    [provider]  fluticasone (FLONASE) 50 MCG/ACT nasal spray Place into the nose. 09/27/14   [provider]  levocetirizine (XYZAL) 5 MG tablet Take by mouth.    [provider]  lidocaine (LIDODERM) 5 % Place 1 patch onto the skin every 12 (twelve) hours. Remove & Discard patch within 12 hours or as directed by MD 06/11/20 06/11/21  Sable Feil, PA-C  lisinopril (PRINIVIL,ZESTRIL) 10 MG tablet Take by mouth. 09/27/14   [provider]  Multiple Vitamin (MULTI-VITAMINS) TABS Take by mouth.    [provider]  niacin 500 MG tablet Take by mouth.    [provider]  Pancrelipase, Lip-Prot-Amyl, (ZENPEP) 20000 UNITS CPEP TAKE AS DIRECTED BEFORE MEALS AND SNACKS 09/27/14   [provider]  Phenazopyridine HCl (AZO URINARY PAIN PO) Take by mouth.    [provider]  Potassium Citrate 15 MEQ (1620 MG) TBCR Take 1 tablet by mouth 2 (two) times daily. 04/28/18   Hollice Espy, MD  psyllium (METAMUCIL) 58.6 % packet Take 1 packet by mouth daily.    [provider]  tamsulosin (FLOMAX) 0.4 MG CAPS capsule Take 1 tablet by mouth daily until you pass the kidney stone or no longer have symptoms 10/31/18   Hinda Kehr,  MD  valACYclovir (VALTREX) 1000 MG tablet Take 1,000 mg by mouth 2 (two) times daily.    [provider]  vitamin B-12 (CYANOCOBALAMIN) 1000 MCG tablet Take 1,000 mcg by mouth daily.    [provider]    Allergies Sulfa antibiotics  Family History  Problem Relation Age of Onset   Alzheimer's disease Father    Kidney Stones Father    Stroke Mother    Diabetes type II Mother    Breast cancer Neg Hx     Social History Social History   Tobacco Use   Smoking status: Former    Types: Cigarettes   Smokeless tobacco: Never  Vaping Use   Vaping Use: Never used  Substance Use Topics   Alcohol use: No    Alcohol/week: 0.0 standard drinks   Drug use: No  Review of Systems  Review of Systems  Constitutional:  Negative for chills and fever.  HENT:  Negative for sore throat.   Respiratory:  Negative for shortness of breath.   Cardiovascular:  Negative for chest pain.  Gastrointestinal:  Negative for abdominal pain.  Genitourinary:  Negative for flank pain.  Musculoskeletal:  Positive for back pain and gait problem. Negative for neck pain.  Skin:  Negative for rash and wound.  Allergic/Immunologic: Negative for immunocompromised state.  Neurological:  Negative for weakness and numbness.  Hematological:  Does not bruise/bleed easily.  All other systems reviewed and are negative.   ____________________________________________  PHYSICAL EXAM:      VITAL SIGNS: ED Triage Vitals  Enc Vitals Group     BP 03/17/21 1047 117/77     Pulse Rate 03/17/21 1047 78     Resp 03/17/21 1047 16     Temp 03/17/21 1047 98.3 F (36.8 C)     Temp Source 03/17/21 1047 Oral     SpO2 03/17/21 1047 97 %     Weight 03/17/21 1049 90 lb (40.8 kg)     Height 03/17/21 1049 4\' 10"  (1.473 m)     Head Circumference --      Peak Flow --      Pain Score 03/17/21 1049 5     Pain Loc --      Pain Edu? --      Excl. in [place]? --      Physical Exam Vitals and nursing note reviewed.   Constitutional:      General: She is not in acute distress.    Appearance: She is well-developed.  HENT:     Head: Normocephalic and atraumatic.  Eyes:     Conjunctiva/sclera: Conjunctivae normal.  Cardiovascular:     Rate and Rhythm: Normal rate and regular rhythm.     Heart sounds: Normal heart sounds.  Pulmonary:     Effort: Pulmonary effort is normal. No respiratory distress.     Breath sounds: No wheezing.  Abdominal:     General: There is no distension.  Musculoskeletal:     Cervical back: Neck supple.     Comments: Marked TTP and palpable paraspinal spasm of upper lumbar spine. Moderate TTP with log roll of L hip, though no bony TTP.  Skin:    General: Skin is warm.     Capillary Refill: Capillary refill takes less than 2 seconds.     Findings: No rash.  Neurological:     Mental Status: She is alert. She is disoriented.     Motor: No abnormal muscle tone.     Comments: Oriented to person and place but not year, which is baseline. MAE with 5/5 strength. Normal sensation to light touch b/l UE and LE. Reflexes normal.      ____________________________________________   LABS (all labs ordered are listed, but only abnormal results are displayed)  Labs Reviewed  BASIC METABOLIC PANEL - Abnormal; Notable for the following components:      Result Value   Glucose, Bld 209 (*)    BUN 24 (*)    All other components within normal limits  CBC - Abnormal; Notable for the following components:   RBC 5.24 (*)    Hemoglobin 15.3 (*)    HCT 47.5 (*)    All other components within normal limits  URINALYSIS, ROUTINE W REFLEX MICROSCOPIC  TYPE AND SCREEN    ____________________________________________  EKG: Normal sinus rhythm, VR 82. PR 140, QRS 84,  QTc 478. No acute ST elevations or depressions. No ischemia or infarct. ________________________________________  RADIOLOGY All imaging, including plain films, CT scans, and ultrasounds, independently reviewed by me, and  interpretations confirmed via formal radiology reads.  ED MD interpretation:   CT Head: NAICA CT C Spine: T2 compression fx, 20% height loss CT L Spine: L1 burst fx 60 % height loss, o/w chronic changes XR Hip Left: femoral neck fx  Official radiology report(s): CT HEAD WO CONTRAST (5MM)  Result Date: 03/17/2021 CLINICAL DATA:  Head trauma, mod-severe; Head trauma, minor (Age >= 65y) EXAM: CT HEAD WITHOUT CONTRAST CT CERVICAL SPINE WITHOUT CONTRAST TECHNIQUE: Multidetector CT imaging of the head and cervical spine was performed following the standard protocol without intravenous contrast. Multiplanar CT image reconstructions of the cervical spine were also generated. COMPARISON:  None. FINDINGS: CT HEAD FINDINGS Brain: No evidence of acute infarction, hemorrhage, hydrocephalus, extra-axial collection or mass lesion/mass effect. Moderate patchy white matter hypodensities, nonspecific but compatible with chronic microvascular ischemic disease. Moderate atrophy with ex vacuo ventricular dilation. Vascular: No hyperdense vessel identified. Calcific intracranial atherosclerosis. Skull: No acute fracture. Sinuses/Orbits: Clear sinuses.  Unremarkable orbits. Other: No mastoid effusions. CT CERVICAL SPINE FINDINGS Alignment: Mild retrolisthesis of C5 on C6, likely degenerative in etiology given severe degenerative changes at this level. Mild anterolisthesis of C6 on C7 and C7 on T1, also likely degenerative given marked facet arthropathy at these levels. Skull base and vertebrae: Approximately 20% height loss of the T2 vertebral body with associated superior endplate trabecular sclerosis, suggestive of acute compression fracture. Probable lucency through the anterior/superior endplate. Partial osseous fusion of the posterior left C1 arch and skull base. Corticated defect in the posterior C1 arch, likely congenital or remote. Soft tissues and spinal canal: No prevertebral fluid or swelling. No visible canal  hematoma. Disc levels: Severe degenerative disease at C5-C6. Multilevel facet arthropathy. Upper chest: Biapical pleuroparenchymal scarring. Otherwise, visualized lung apices are clear. IMPRESSION: CT cervical spine: 1. Partially imaged T2 compression fracture, likely acute. Associated 20% height loss. Recommend dedicated thoracic spine CT for further evaluation. 2. Severe multilevel degenerative change, detailed above. 3. Partial left posterior atlanto-occipital assimilation. CT head: 1. No evidence of acute intracranial abnormality. 2. Moderate chronic microvascular ischemic disease and atrophy. Electronically Signed   By: Margaretha Sheffield M.D.   On: 03/17/2021 13:15   CT Cervical Spine Wo Contrast  Result Date: 03/17/2021 CLINICAL DATA:  Head trauma, mod-severe; Head trauma, minor (Age >= 65y) EXAM: CT HEAD WITHOUT CONTRAST CT CERVICAL SPINE WITHOUT CONTRAST TECHNIQUE: Multidetector CT imaging of the head and cervical spine was performed following the standard protocol without intravenous contrast. Multiplanar CT image reconstructions of the cervical spine were also generated. COMPARISON:  None. FINDINGS: CT HEAD FINDINGS Brain: No evidence of acute infarction, hemorrhage, hydrocephalus, extra-axial collection or mass lesion/mass effect. Moderate patchy white matter hypodensities, nonspecific but compatible with chronic microvascular ischemic disease. Moderate atrophy with ex vacuo ventricular dilation. Vascular: No hyperdense vessel identified. Calcific intracranial atherosclerosis. Skull: No acute fracture. Sinuses/Orbits: Clear sinuses.  Unremarkable orbits. Other: No mastoid effusions. CT CERVICAL SPINE FINDINGS Alignment: Mild retrolisthesis of C5 on C6, likely degenerative in etiology given severe degenerative changes at this level. Mild anterolisthesis of C6 on C7 and C7 on T1, also likely degenerative given marked facet arthropathy at these levels. Skull base and vertebrae: Approximately 20% height  loss of the T2 vertebral body with associated superior endplate trabecular sclerosis, suggestive of acute compression fracture. Probable lucency through the anterior/superior endplate. Partial  osseous fusion of the posterior left C1 arch and skull base. Corticated defect in the posterior C1 arch, likely congenital or remote. Soft tissues and spinal canal: No prevertebral fluid or swelling. No visible canal hematoma. Disc levels: Severe degenerative disease at C5-C6. Multilevel facet arthropathy. Upper chest: Biapical pleuroparenchymal scarring. Otherwise, visualized lung apices are clear. IMPRESSION: CT cervical spine: 1. Partially imaged T2 compression fracture, likely acute. Associated 20% height loss. Recommend dedicated thoracic spine CT for further evaluation. 2. Severe multilevel degenerative change, detailed above. 3. Partial left posterior atlanto-occipital assimilation. CT head: 1. No evidence of acute intracranial abnormality. 2. Moderate chronic microvascular ischemic disease and atrophy. Electronically Signed   By: Margaretha Sheffield M.D.   On: 03/17/2021 13:15   CT Lumbar Spine Wo Contrast  Result Date: 03/17/2021 CLINICAL DATA:  Low back pain, trauma, multiple falls EXAM: CT LUMBAR SPINE WITHOUT CONTRAST TECHNIQUE: Multidetector CT imaging of the lumbar spine was performed without intravenous contrast administration. Multiplanar CT image reconstructions were also generated. COMPARISON:  CT 01/10/2021 FINDINGS: Segmentation: 5 lumbar type vertebrae. Alignment: Normal Vertebrae: Prior vertebroplasties at T11 and T12 without evidence of progressive height loss. There is an acute L1 burst fracture with approximately 60% height loss and bony retropulsion measuring up to 5 mm. Unchanged compression deformities of L4 and L5, with mild retro portion of the superior endplate of L4. Paraspinal and other soft tissues: Prior cholecystectomy. Partially visualized pneumobilia, likely related to reported prior  Whipple procedure. There is a nonobstructive 5 mm stone in the left kidney. Unchanged low-density left adrenal nodule, statistically likely to be an adenoma. Disc levels: There is moderate to severe disc height loss at L5-S1. There is disc bulging at L3-L4, L4-L5, and L5-S1 along with severe bilateral facet arthropathy resulting in varying degrees spinal canal and neural foraminal narrowing, most severe canal narrowing at L3-L4 and worst neural foraminal narrowing at L5-S1. IMPRESSION: Acute L1 burst fracture with approximately 60% height loss and mild bony retropulsion measuring up to 5 mm. Unchanged prior vertebroplasties at T11 and T12 without evidence of progressive height loss. Unchanged compression deformities of L4 and L5. Multilevel degenerative disc disease and facet arthropathy with disc bulging in the lower lumbar spine resulting in varying degrees of spinal canal or neural foraminal narrowing, worst at L3-L4 and L5-S1. Prior Whipple procedure with pneumobilia. Electronically Signed   By: Maurine Simmering M.D.   On: 03/17/2021 13:11   DG HIP UNILAT WITH PELVIS 2-3 VIEWS LEFT  Result Date: 03/17/2021 CLINICAL DATA:  Fall, left hip pain EXAM: DG HIP (WITH OR WITHOUT PELVIS) 2-3V LEFT COMPARISON:  01/10/2021 FINDINGS: Acute left femoral neck fracture with approximately 2-3 cm of proximal displacement. Left femoral head remains aligned without dislocation. No definite fracture involvement of the intertrochanteric aspect of the proximal femur. Bones appear demineralized. Prior right hip hemiarthroplasty, grossly intact. IMPRESSION: Acute moderately displaced left femoral neck fracture. Electronically Signed   By: Davina Poke D.O.   On: 03/17/2021 12:45    ____________________________________________  PROCEDURES   Procedure(s) performed (including Critical Care):  .1-3 Lead EKG Interpretation Performed by: Duffy Bruce, MD Authorized by: Duffy Bruce, MD     Interpretation: normal      ECG rate:  70-90   ECG rate assessment: normal     Rhythm: sinus rhythm     Ectopy: none     Conduction: normal   Comments:     Indication: Fall, weakness  ____________________________________________  INITIAL IMPRESSION / MDM / [place] / ED  COURSE  As part of my medical decision making, I reviewed the following data within the [place] notes reviewed and incorporated, Old chart reviewed, Notes from prior ED visits, and [place] Controlled Substance Database       *ZAYLAH BLECHA was evaluated in Emergency Department on 03/17/2021 for the symptoms described in the history of present illness. She was evaluated in the context of the global COVID-19 pandemic, which necessitated consideration that the patient might be at risk for infection with the SARS-CoV-2 virus that causes COVID-19. Institutional protocols and algorithms that pertain to the evaluation of patients at risk for COVID-19 are in a state of rapid change based on information released by regulatory bodies including the CDC and federal and state organizations. These policies and algorithms were followed during the patient's care in the ED.  Some ED evaluations and interventions may be delayed as a result of limited staffing during the pandemic.*     Medical Decision Making:  75 yo F here with multiple recent falls, lower back and L hip pain. Imaging, labs as above. CBC without anemia or leukocytosis. BMP unremarkable. UA pending. CT head shows NAICA. Imaging remarkable for:  T2 compression fx, minimal height loss L1 burst fx, 60% height loss w/ 5 mm RP Left femoral neck fx Old compression fx of L spine  Dr. Marsa Aris consulted for spine fractures, will place TLSO, keep bed flat/spine precautions until he sees pt in ED. For hip fx, Dr. Rudene Christians wll see, likely OR tomorrow. Admit to Hospitalist. Pt's son/POA notified, in agreement with this plan.  ____________________________________________  FINAL  CLINICAL IMPRESSION(S) / ED DIAGNOSES  Final diagnoses:  Left hip pain  Closed stable burst fracture of first lumbar vertebra, initial encounter ([place])  Closed fracture of left hip, initial encounter ([place])  Fall, initial encounter     MEDICATIONS GIVEN DURING THIS VISIT:  Medications  morphine 4 MG/ML injection 4 mg (4 mg Intravenous Patient Refused/Not Given 03/17/21 1413)  ondansetron (ZOFRAN) injection 4 mg (4 mg Intravenous Given 03/17/21 1138)  morphine 2 MG/ML injection 2 mg (2 mg Intravenous Given 03/17/21 1139)  acetaminophen (TYLENOL) tablet 1,000 mg (1,000 mg Oral Given 03/17/21 1139)     ED Discharge Orders     None        Note:  This document was prepared using Dragon voice recognition software and may include unintentional dictation errors.   Duffy Bruce, MD 03/17/21 [phone]

## 2021-03-17 NOTE — ED Notes (Signed)
Refused morphine at this time

## 2021-03-17 NOTE — Progress Notes (Signed)
Patient not in room and unable to be examined.   CT lumbar spine shows a new  L1 fracture that appears like sever compression farcture but burst type pattern is possible. She is reportedly neurologically intact. She has known prior fractures s/p kyphoplasty in the past. She also has a new hip fracture. Given these factors, recommendation is for a TLSO brace and to wear this at all times. She can go to surgery for the hip fracture if needed. No surgery indicated for the lumbar spine at this time, can see tomorrow

## 2021-03-17 NOTE — Plan of Care (Signed)

## 2021-03-17 NOTE — ED Triage Notes (Signed)
Arrived by EMS from [facility] for multiple falls. Staff reported unwitnessed. EMS reports shortening of left leg. C/o left shoulder/hip/back pain. EMS vitals 105/68 b/p, 86HR, 97%RA, FSBS 246

## 2021-03-17 NOTE — Progress Notes (Signed)
Orthopedic Tech Progress Note Patient Details:  [name] 06-Oct-1945 [id number]  Called in order to [employer] for a TLSO BRACE   Patient ID: Kimberly Meyer, female   DOB: [birth year], 75 y.o.   MRN: [id number]  Janit Pagan 03/17/2021, 1:44 PM

## 2021-03-17 NOTE — ED Notes (Signed)
Patient Jewelry. Necklace with jewel, necklace with charm, 2 rings, and bracelets all sent home with Granddaughter Ally.

## 2021-03-17 NOTE — ED Notes (Signed)
Necklace with cross and necklace with jewel at bedside at this time

## 2021-03-17 NOTE — ED Notes (Signed)
Patient currently wearing TLSO brace

## 2021-03-18 ENCOUNTER — Encounter: Payer: Self-pay | Admitting: Internal Medicine

## 2021-03-18 ENCOUNTER — Inpatient Hospital Stay: Payer: Medicare Other | Admitting: Anesthesiology

## 2021-03-18 ENCOUNTER — Encounter
Admission: EM | Disposition: A | Discharge status: Skilled Nursing Facility | Payer: Self-pay | Source: Skilled Nursing Facility | Attending: Internal Medicine

## 2021-03-18 ENCOUNTER — Inpatient Hospital Stay: Payer: Medicare Other

## 2021-03-18 HISTORY — PX: HIP ARTHROPLASTY: SHX981

## 2021-03-18 LAB — CREATININE, SERUM
Creatinine, Ser: 0.95 mg/dL (ref 0.44–1.00)
GFR, Estimated: >60 mL/min (ref >60)

## 2021-03-18 LAB — CBC
HCT: 41.9 % (ref 36.0–46.0)
Hemoglobin: 13.9 g/dL (ref 12.0–15.0)
MCH: 30.2 pg (ref 26.0–34.0)
MCHC: 33.2 g/dL (ref 30.0–36.0)
MCV: 91.1 fL (ref 80.0–100.0)
Platelets: 233 10*3/uL (ref 150–400)
RBC: 4.6 MIL/uL (ref 3.87–5.11)
RDW: 12.3 % (ref 11.5–15.5)
WBC: 7.9 10*3/uL (ref 4.0–10.5)
nRBC: 0 % (ref 0.0–0.2)

## 2021-03-18 LAB — ABO/RH: ABO/RH(D): O POS

## 2021-03-18 LAB — GLUCOSE, CAPILLARY
Glucose-Capillary: 170 mg/dL — ABNORMAL HIGH (ref 70–99)
Glucose-Capillary: 165 mg/dL — ABNORMAL HIGH (ref 70–99)
Glucose-Capillary: 192 mg/dL — ABNORMAL HIGH (ref 70–99)
Glucose-Capillary: 173 mg/dL — ABNORMAL HIGH (ref 70–99)
Glucose-Capillary: 216 mg/dL — ABNORMAL HIGH (ref 70–99)
Glucose-Capillary: 199 mg/dL — ABNORMAL HIGH (ref 70–99)

## 2021-03-18 SURGERY — HEMIARTHROPLASTY, HIP, DIRECT ANTERIOR APPROACH, FOR FRACTURE
Anesthesia: General | Site: Hip | Laterality: Left

## 2021-03-18 MED ORDER — DEXAMETHASONE SODIUM PHOSPHATE 10 MG/ML IJ SOLN
INTRAMUSCULAR | Status: DC | PRN
  Administered 2021-03-18: 5 mg via INTRAVENOUS

## 2021-03-18 MED ORDER — EPHEDRINE SULFATE 50 MG/ML IJ SOLN
INTRAMUSCULAR | Status: DC | PRN
  Administered 2021-03-18: 10 mg via INTRAVENOUS

## 2021-03-18 MED ORDER — EPHEDRINE 5 MG/ML INJ
INTRAVENOUS | Status: AC
  Filled 2021-03-18: qty 5

## 2021-03-18 MED ORDER — SODIUM CHLORIDE 0.9 % IV SOLN: INTRAVENOUS | Status: DC

## 2021-03-18 MED ORDER — MAGNESIUM HYDROXIDE 400 MG/5ML PO SUSP
30.0000 mL | Freq: Every day | ORAL | Status: DC
  Administered 2021-03-18 – 2021-03-20 (×3): 30 mL via ORAL
  Filled 2021-03-18 (×3): qty 30

## 2021-03-18 MED ORDER — FLEET ENEMA 7-19 GM/118ML RE ENEM: 1.0000 | ENEMA | Freq: Once | RECTAL | Status: DC | PRN

## 2021-03-18 MED ORDER — ONDANSETRON HCL 4 MG/2ML IJ SOLN: 4.0000 mg | Freq: Four times a day (QID) | INTRAMUSCULAR | Status: DC | PRN

## 2021-03-18 MED ORDER — FENTANYL CITRATE (PF) 100 MCG/2ML IJ SOLN
INTRAMUSCULAR | Status: DC | PRN
  Administered 2021-03-18: 25 ug via INTRAVENOUS
  Administered 2021-03-18: 50 ug via INTRAVENOUS

## 2021-03-18 MED ORDER — ONDANSETRON HCL 4 MG/2ML IJ SOLN
INTRAMUSCULAR | Status: AC
  Filled 2021-03-18: qty 2

## 2021-03-18 MED ORDER — BISACODYL 10 MG RE SUPP: 10.0000 mg | Freq: Every day | RECTAL | Status: DC | PRN

## 2021-03-18 MED ORDER — ACETAMINOPHEN 10 MG/ML IV SOLN
INTRAVENOUS | Status: AC
  Filled 2021-03-18: qty 100

## 2021-03-18 MED ORDER — ROCURONIUM BROMIDE 100 MG/10ML IV SOLN
INTRAVENOUS | Status: DC | PRN
  Administered 2021-03-18: 40 mg via INTRAVENOUS
  Administered 2021-03-18: 20 mg via INTRAVENOUS

## 2021-03-18 MED ORDER — FENTANYL CITRATE (PF) 100 MCG/2ML IJ SOLN
25.0000 ug | INTRAMUSCULAR | Status: DC | PRN
  Administered 2021-03-18 (×2): 25 ug via INTRAVENOUS

## 2021-03-18 MED ORDER — ALUM & MAG HYDROXIDE-SIMETH 200-200-20 MG/5ML PO SUSP: 30.0000 mL | ORAL | Status: DC | PRN

## 2021-03-18 MED ORDER — DIPHENHYDRAMINE HCL 12.5 MG/5ML PO ELIX: 12.5000 mg | ORAL_SOLUTION | ORAL | Status: DC | PRN

## 2021-03-18 MED ORDER — ONDANSETRON HCL 4 MG PO TABS
4.0000 mg | ORAL_TABLET | Freq: Four times a day (QID) | ORAL | Status: DC | PRN
  Administered 2021-03-20: 4 mg via ORAL
  Filled 2021-03-18: qty 1

## 2021-03-18 MED ORDER — ENOXAPARIN SODIUM 30 MG/0.3ML IJ SOSY
30.0000 mg | PREFILLED_SYRINGE | INTRAMUSCULAR | Status: DC
  Administered 2021-03-19 – 2021-03-21 (×3): 30 mg via SUBCUTANEOUS
  Filled 2021-03-18 (×3): qty 0.3

## 2021-03-18 MED ORDER — CEFAZOLIN SODIUM-DEXTROSE 1-4 GM/50ML-% IV SOLN
1.0000 g | Freq: Four times a day (QID) | INTRAVENOUS | Status: AC
  Administered 2021-03-18 – 2021-03-19 (×2): 1 g via INTRAVENOUS
  Filled 2021-03-18 (×3): qty 50

## 2021-03-18 MED ORDER — PROPOFOL 10 MG/ML IV BOLUS
INTRAVENOUS | Status: DC | PRN
  Administered 2021-03-18: 60 mg via INTRAVENOUS

## 2021-03-18 MED ORDER — ROCURONIUM BROMIDE 10 MG/ML (PF) SYRINGE
PREFILLED_SYRINGE | INTRAVENOUS | Status: AC
  Filled 2021-03-18: qty 10

## 2021-03-18 MED ORDER — ACETAMINOPHEN 325 MG PO TABS: 325.0000 mg | ORAL_TABLET | Freq: Four times a day (QID) | ORAL | Status: DC | PRN

## 2021-03-18 MED ORDER — PROPOFOL 10 MG/ML IV BOLUS
INTRAVENOUS | Status: AC
  Filled 2021-03-18: qty 20

## 2021-03-18 MED ORDER — FENTANYL CITRATE (PF) 100 MCG/2ML IJ SOLN
INTRAMUSCULAR | Status: AC
  Filled 2021-03-18: qty 2

## 2021-03-18 MED ORDER — METHOCARBAMOL 500 MG PO TABS
500.0000 mg | ORAL_TABLET | Freq: Four times a day (QID) | ORAL | Status: DC | PRN
  Administered 2021-03-18 – 2021-03-20 (×5): 500 mg via ORAL
  Filled 2021-03-18 (×5): qty 1

## 2021-03-18 MED ORDER — HEMOSTATIC AGENTS (NO CHARGE) OPTIME
TOPICAL | Status: DC | PRN
  Administered 2021-03-18: 2 via TOPICAL

## 2021-03-18 MED ORDER — LIDOCAINE HCL (CARDIAC) PF 100 MG/5ML IV SOSY
PREFILLED_SYRINGE | INTRAVENOUS | Status: DC | PRN
  Administered 2021-03-18: 60 mg via INTRAVENOUS
  Administered 2021-03-18: 40 mg via INTRAVENOUS

## 2021-03-18 MED ORDER — OXYCODONE HCL 5 MG/5ML PO SOLN: 5.0000 mg | Freq: Once | ORAL | Status: DC | PRN

## 2021-03-18 MED ORDER — METHOCARBAMOL 1000 MG/10ML IJ SOLN
500.0000 mg | Freq: Four times a day (QID) | INTRAVENOUS | Status: DC | PRN
  Filled 2021-03-18: qty 5

## 2021-03-18 MED ORDER — OXYCODONE HCL 5 MG PO TABS: 5.0000 mg | ORAL_TABLET | Freq: Once | ORAL | Status: DC | PRN

## 2021-03-18 MED ORDER — HYDROMORPHONE HCL 1 MG/ML IJ SOLN
0.5000 mg | INTRAMUSCULAR | Status: DC | PRN
  Administered 2021-03-19: 1 mg via INTRAVENOUS
  Filled 2021-03-18: qty 1

## 2021-03-18 MED ORDER — PHENOL 1.4 % MT LIQD
1.0000 | OROMUCOSAL | Status: DC | PRN
  Filled 2021-03-18: qty 177

## 2021-03-18 MED ORDER — LIDOCAINE HCL (PF) 2 % IJ SOLN
INTRAMUSCULAR | Status: AC
  Filled 2021-03-18: qty 5

## 2021-03-18 MED ORDER — SODIUM CHLORIDE 0.9 % IV SOLN: INTRAVENOUS | Status: DC | PRN

## 2021-03-18 MED ORDER — SUGAMMADEX SODIUM 200 MG/2ML IV SOLN
INTRAVENOUS | Status: DC | PRN
  Administered 2021-03-18: 200 mg via INTRAVENOUS

## 2021-03-18 MED ORDER — NEOMYCIN-POLYMYXIN B GU 40-200000 IR SOLN
Status: DC | PRN
  Administered 2021-03-18: 4 mL

## 2021-03-18 MED ORDER — DEXAMETHASONE SODIUM PHOSPHATE 10 MG/ML IJ SOLN
INTRAMUSCULAR | Status: AC
  Filled 2021-03-18: qty 1

## 2021-03-18 MED ORDER — BUPIVACAINE-EPINEPHRINE (PF) 0.25% -1:200000 IJ SOLN
INTRAMUSCULAR | Status: AC
  Filled 2021-03-18: qty 30

## 2021-03-18 MED ORDER — METOCLOPRAMIDE HCL 5 MG/ML IJ SOLN: 5.0000 mg | Freq: Three times a day (TID) | INTRAMUSCULAR | Status: DC | PRN

## 2021-03-18 MED ORDER — OXYCODONE HCL 5 MG PO TABS
5.0000 mg | ORAL_TABLET | ORAL | Status: DC | PRN
  Administered 2021-03-21: 10 mg via ORAL
  Filled 2021-03-18 (×2): qty 2

## 2021-03-18 MED ORDER — ZOLPIDEM TARTRATE 5 MG PO TABS: 5.0000 mg | ORAL_TABLET | Freq: Every evening | ORAL | Status: DC | PRN

## 2021-03-18 MED ORDER — METOCLOPRAMIDE HCL 10 MG PO TABS: 5.0000 mg | ORAL_TABLET | Freq: Three times a day (TID) | ORAL | Status: DC | PRN

## 2021-03-18 MED ORDER — 0.9 % SODIUM CHLORIDE (POUR BTL) OPTIME
TOPICAL | Status: DC | PRN
  Administered 2021-03-18: 1000 mL

## 2021-03-18 MED ORDER — ACETAMINOPHEN 10 MG/ML IV SOLN
INTRAVENOUS | Status: DC | PRN
  Administered 2021-03-18: 1000 mg via INTRAVENOUS

## 2021-03-18 MED ORDER — ONDANSETRON HCL 4 MG/2ML IJ SOLN
INTRAMUSCULAR | Status: DC | PRN
  Administered 2021-03-18: 4 mg via INTRAVENOUS

## 2021-03-18 MED ORDER — OXYCODONE HCL 5 MG PO TABS
10.0000 mg | ORAL_TABLET | ORAL | Status: DC | PRN
  Administered 2021-03-18 – 2021-03-19 (×3): 15 mg via ORAL
  Administered 2021-03-19 – 2021-03-20 (×2): 10 mg via ORAL
  Administered 2021-03-20: 15 mg via ORAL
  Filled 2021-03-18 (×5): qty 3

## 2021-03-18 MED ORDER — DOCUSATE SODIUM 100 MG PO CAPS
100.0000 mg | ORAL_CAPSULE | Freq: Two times a day (BID) | ORAL | Status: DC
  Administered 2021-03-18 – 2021-03-21 (×6): 100 mg via ORAL
  Filled 2021-03-18 (×6): qty 1

## 2021-03-18 MED ORDER — BUPIVACAINE-EPINEPHRINE 0.25% -1:200000 IJ SOLN
INTRAMUSCULAR | Status: DC | PRN
  Administered 2021-03-18: 30 mL

## 2021-03-18 MED ORDER — PHENYLEPHRINE HCL (PRESSORS) 10 MG/ML IV SOLN
INTRAVENOUS | Status: DC | PRN
  Administered 2021-03-18: 240 ug via INTRAVENOUS
  Administered 2021-03-18: 160 ug via INTRAVENOUS

## 2021-03-18 MED ORDER — MENTHOL 3 MG MT LOZG
1.0000 | LOZENGE | OROMUCOSAL | Status: DC | PRN
  Filled 2021-03-18: qty 9

## 2021-03-18 MED ORDER — NEOMYCIN-POLYMYXIN B GU 40-200000 IR SOLN
Status: AC
  Filled 2021-03-18: qty 4

## 2021-03-18 MED ORDER — ACETAMINOPHEN 500 MG PO TABS
1000.0000 mg | ORAL_TABLET | Freq: Four times a day (QID) | ORAL | Status: AC
  Administered 2021-03-18 – 2021-03-19 (×4): 1000 mg via ORAL
  Filled 2021-03-18 (×4): qty 2

## 2021-03-18 SURGICAL SUPPLY — 68 items
APL PRP STRL LF DISP 70% ISPRP (MISCELLANEOUS) ×1
BLADE SAGITTAL AGGR TOOTH XLG (BLADE) ×2 IMPLANT
BNDG COHESIVE 6X5 TAN ST LF (GAUZE/BANDAGES/DRESSINGS) ×6 IMPLANT
BOWL CEMENT MIXING ADV NOZZLE (MISCELLANEOUS) ×1 IMPLANT
CANISTER WOUND CARE 500ML ATS (WOUND CARE) ×2 IMPLANT
CEMENT BONE 40GM (Cement) ×2 IMPLANT
CEMENT RESTRICTOR DEPUY SZ 1 (Cement) ×1 IMPLANT
CHLORAPREP W/TINT 26 (MISCELLANEOUS) ×2 IMPLANT
COVER BACK TABLE REUSABLE LG (DRAPES) ×2 IMPLANT
DRAPE 3/4 80X56 (DRAPES) ×6 IMPLANT
DRAPE C-ARM XRAY 36X54 (DRAPES) ×2 IMPLANT
DRAPE INCISE IOBAN 66X60 STRL (DRAPES) IMPLANT
DRAPE POUCH INSTRU U-SHP 10X18 (DRAPES) ×2 IMPLANT
DRESSING SURGICEL FIBRLLR 1X2 (HEMOSTASIS) ×2 IMPLANT
DRSG MEPILEX SACRM 8.7X9.8 (GAUZE/BANDAGES/DRESSINGS) ×1 IMPLANT
DRSG OPSITE POSTOP 4X8 (GAUZE/BANDAGES/DRESSINGS) ×4 IMPLANT
DRSG SURGICEL FIBRILLAR 1X2 (HEMOSTASIS) ×4
ELECT BLADE 6.5 EXT (BLADE) ×2 IMPLANT
ELECT REM PT RETURN 9FT ADLT (ELECTROSURGICAL) ×2
ELECTRODE REM PT RTRN 9FT ADLT (ELECTROSURGICAL) ×1 IMPLANT
GAUZE 4X4 16PLY ~~LOC~~+RFID DBL (SPONGE) ×2 IMPLANT
GLOVE SURG SYN 9.0  PF PI (GLOVE) ×4
GLOVE SURG SYN 9.0 PF PI (GLOVE) ×2 IMPLANT
GLOVE SURG UNDER POLY LF SZ9 (GLOVE) ×2 IMPLANT
GOWN SRG 2XL LVL 4 RGLN SLV (GOWNS) ×1 IMPLANT
GOWN STRL NON-REIN 2XL LVL4 (GOWNS) ×2
GOWN STRL REUS W/ TWL LRG LVL3 (GOWN DISPOSABLE) ×1 IMPLANT
GOWN STRL REUS W/TWL LRG LVL3 (GOWN DISPOSABLE) ×2
HEAD BIPOLAR 43X22 MED (Head) ×1 IMPLANT
HEAD FEM S 22.2MM -2.5 0125124 (Head) ×1 IMPLANT
HEMOVAC 400CC 10FR (MISCELLANEOUS) IMPLANT
HOLDER FOLEY CATH W/STRAP (MISCELLANEOUS) ×1 IMPLANT
HOOD PEEL AWAY FLYTE STAYCOOL (MISCELLANEOUS) ×1 IMPLANT
KIT PREVENA INCISION MGT 13 (CANNISTER) ×2 IMPLANT
MANIFOLD NEPTUNE II (INSTRUMENTS) ×2 IMPLANT
MAT ABSORB  FLUID 56X50 GRAY (MISCELLANEOUS) ×2
MAT ABSORB FLUID 56X50 GRAY (MISCELLANEOUS) ×1 IMPLANT
NDL SAFETY ECLIPSE 18X1.5 (NEEDLE) ×1 IMPLANT
NDL SPNL 20GX3.5 QUINCKE YW (NEEDLE) ×2 IMPLANT
NEEDLE HYPO 18GX1.5 SHARP (NEEDLE) ×2
NEEDLE SPNL 20GX3.5 QUINCKE YW (NEEDLE) ×4 IMPLANT
NOZZLE FLEX THIN (MISCELLANEOUS) ×1 IMPLANT
NS IRRIG 1000ML POUR BTL (IV SOLUTION) ×2 IMPLANT
PACK HIP COMPR (MISCELLANEOUS) ×2 IMPLANT
PRESSURIZER CEMENT PROX FEM SM (MISCELLANEOUS) ×1 IMPLANT
SCALPEL PROTECTED #10 DISP (BLADE) ×4 IMPLANT
SOL PREP PVP 2OZ (MISCELLANEOUS)
SOLUTION PREP PVP 2OZ (MISCELLANEOUS) ×1 IMPLANT
SOLUTION PRONTOSAN WOUND 350ML (IRRIGATION / IRRIGATOR) IMPLANT
SPONGE DRAIN TRACH 4X4 STRL 2S (GAUZE/BANDAGES/DRESSINGS) ×2 IMPLANT
SPONGE T-LAP 18X18 ~~LOC~~+RFID (SPONGE) ×4 IMPLANT
STAPLER SKIN PROX 35W (STAPLE) ×2 IMPLANT
STEM FEMORAL STD SZ0 (Stem) ×1 IMPLANT
STRAP SAFETY 5IN WIDE (MISCELLANEOUS) ×2 IMPLANT
SUT DVC 2 QUILL PDO  T11 36X36 (SUTURE) ×2
SUT DVC 2 QUILL PDO T11 36X36 (SUTURE) ×1 IMPLANT
SUT SILK 0 (SUTURE) ×2
SUT SILK 0 30XBRD TIE 6 (SUTURE) ×1 IMPLANT
SUT V-LOC 90 ABS DVC 3-0 CL (SUTURE) ×2 IMPLANT
SUT VIC AB 1 CT1 36 (SUTURE) ×2 IMPLANT
SYR 20ML LL LF (SYRINGE) ×2 IMPLANT
SYR 30ML LL (SYRINGE) ×2 IMPLANT
SYR 50ML LL SCALE MARK (SYRINGE) ×4 IMPLANT
SYR BULB IRRIG 60ML STRL (SYRINGE) ×2 IMPLANT
TAPE MICROFOAM 4IN (TAPE) ×1 IMPLANT
TOWEL OR 17X26 4PK STRL BLUE (TOWEL DISPOSABLE) ×1 IMPLANT
TRAY FOLEY MTR SLVR 16FR STAT (SET/KITS/TRAYS/PACK) ×1 IMPLANT
WATER STERILE IRR 500ML POUR (IV SOLUTION) ×2 IMPLANT

## 2021-03-18 NOTE — Anesthesia Procedure Notes (Signed)
Procedure Name: Intubation Date/Time: 03/18/2021 11:01 AM Performed by: Lia Foyer, CRNA Pre-anesthesia Checklist: Patient identified, Emergency Drugs available, Suction available and Patient being monitored Patient Re-evaluated:Patient Re-evaluated prior to induction Oxygen Delivery Method: Circle system utilized Preoxygenation: Pre-oxygenation with 100% oxygen Induction Type: IV induction Ventilation: Mask ventilation without difficulty Laryngoscope Size: McGraph and 3 Grade View: Grade I Tube type: Oral Tube size: 6.5 mm Number of attempts: 1 Airway Equipment and Method: Stylet, Oral airway and Video-laryngoscopy Placement Confirmation: ETT inserted through vocal cords under direct vision, positive ETCO2 and breath sounds checked- equal and bilateral Secured at: 19 cm Tube secured with: Tape Dental Injury: Teeth and Oropharynx as per pre-operative assessment

## 2021-03-18 NOTE — Op Note (Signed)
03/18/2021  12:11 PM  PATIENT:  Kimberly Meyer  75 y.o. female  PRE-OPERATIVE DIAGNOSIS:  Left hip fracture displaced subcapital  POST-OPERATIVE DIAGNOSIS:  Left hip fracture displaced subcapital  PROCEDURE:  Procedure(s): ARTHROPLASTY BIPOLAR HIP (HEMIARTHROPLASTY) (Left)  SURGEON: Laurene Footman, MD  ASSISTANTS: None  ANESTHESIA:   general  EBL:  No intake/output data recorded.  BLOOD ADMINISTERED:none  DRAINS:  Incisional wound VAC    LOCAL MEDICATIONS USED:  MARCAINE     SPECIMEN: Left femoral head  DISPOSITION OF SPECIMEN:  PATHOLOGY  COUNTS:  YES  TOURNIQUET:  * No tourniquets in log *  IMPLANTS: Medacta cemented AMIS 0 standard stem with metal S 22.2 Head, 43 mm bipolar head  DICTATION: .Dragon Dictation   The patient was brought to the operating room and after general anesthesia was obtained patient was placed on the operative table with the ipsilateral foot into the Medacta attachment, contralateral leg on a well-padded table. C-arm was brought in and preop template x-ray taken. After prepping and draping in usual sterile fashion appropriate patient identification and timeout procedures were completed. Anterior approach to the hip was obtained and centered over the greater trochanter and TFL muscle. The subcutaneous tissue was incised hemostasis being achieved by electrocautery. TFL fascia was incised and the muscle retracted laterally deep retractor placed. The lateral femoral circumflex vessels were identified and ligated. The anterior capsule was exposed and a capsulotomy performed.  There was a subcapital displaced fracture.  The neck was identified and a femoral neck cut carried out with a saw. The head was removed without difficulty and showed relatively minimal degenerative changes to the femoral head and acetabulum.  Head sized to a 43 and 43 trial fit well.  The leg was then externally rotated and ischiofemoral and pubofemoral releases carried out. The femur was  sequentially broached to a size 1, size 1 trials were placed and the final components chosen. The 0 standard stem was inserted after inserting cement plug and pressurizing cement, along with a metal S 22.2  mm head and 43 mm bipolar liner. The hip was reduced and was stable the wound was thoroughly irrigated with fibrillar placed along the posterior capsule and medial neck. The deep fascia ws closed using a heavy Quill after infiltration of 30 cc of quarter percent Sensorcaine with epinephrine.3-0 V-loc to close the skin with skin staples.  Incisional wound VAC applied and  patient was sent to recovery in stable condition.   PLAN OF CARE: Admit to inpatient

## 2021-03-18 NOTE — OR Nursing (Signed)
Patient arrived to OR with silver anklet on left ankle. Removed and placed with patient-labeled biohazard bag and clipped in chart.  Ladell Heads, RN

## 2021-03-18 NOTE — Anesthesia Preprocedure Evaluation (Addendum)
Anesthesia Evaluation  Patient identified by MRN, date of birth, ID band Patient confused    Reviewed: Allergy & Precautions, NPO status , Patient's Chart, lab work & pertinent test results  Airway Mallampati: III  TM Distance: <3 FB Neck ROM: full    Dental  (+) Chipped, Poor Dentition, Missing   Pulmonary neg shortness of breath, former smoker,    Pulmonary exam normal        Cardiovascular Exercise Tolerance: Good hypertension, (-) angina(-) Past MI and (-) DOE Normal cardiovascular exam     Neuro/Psych  Headaches, PSYCHIATRIC DISORDERS negative psych ROS   GI/Hepatic Neg liver ROS, GERD  Medicated and Controlled,  Endo/Other  diabetes, Type 2  Renal/GU Renal disease     Musculoskeletal   Abdominal   Peds  Hematology negative hematology ROS (+)   Anesthesia Other Findings Past Medical History: No date: Ampullary carcinoma (HCC) No date: Anemia No date: Anxiety No date: Chronic tension headache No date: Depression No date: GERD (gastroesophageal reflux disease) No date: History of pulmonary embolus (PE) No date: Hyperlipemia No date: Hypertension No date: IBS (irritable bowel syndrome) No date: Kidney stone No date: Low back pain No date: Situational stress  Past Surgical History: No date: APPENDECTOMY 1990's: AUGMENTATION MAMMAPLASTY No date: BLADDER SURGERY     Comment:  bladder tack 07/15/2016: BREAST IMPLANT REMOVAL; Left     Comment:  Procedure: REMOVAL OF EXPOSED LEFT BREAST IMPLANT;                Surgeon: Wallace Going, DO;  Location: [facility];  Service: Plastics;  Laterality: Left; No date: CHOLECYSTECTOMY No date: COLONOSCOPY 07/22/2015: COLONOSCOPY WITH PROPOFOL; N/A     Comment:  Procedure: COLONOSCOPY WITH PROPOFOL;  Surgeon: Manya Silvas, MD;  Location[facility] ENDOSCOPY;  Service:               Endoscopy;  Laterality: N/A; 11/13/2014:  CYSTOSCOPY/URETEROSCOPY/HOLMIUM LASER/STENT PLACEMENT; Left     Comment:  Procedure: CYSTOSCOPY/URETEROSCOPY/HOLMIUM LASER/STENT               PLACEMENT;  Surgeon: Hollice Espy, MD;  Location: [facility]               ORS;  Service: Urology;  Laterality: Left; 06/09/2018: EXTRACORPOREAL SHOCK WAVE LITHOTRIPSY; Left     Comment:  Procedure: EXTRACORPOREAL SHOCK WAVE LITHOTRIPSY (ESWL);              Surgeon: Abbie Sons, MD;  Location: [facility] ORS;                Service: Urology;  Laterality: Left; No date: HIP SURGERY 06/04/2015: KYPHOPLASTY; N/A     Comment:  Procedure: KYPHOPLASTY T11, T12;  Surgeon: Hessie Knows,              MD;  Location: [facility] ORS;  Service: Orthopedics;                Laterality: N/A; No date: LITHOTRIPSY No date: 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 No date: PLACEMENT OF BREAST IMPLANTS No date: TUBAL LIGATION No date: VENTRAL HERNIA REPAIR No date: WHIPPLE PROCEDURE  BMI    Body Mass Index: 18.81 kg/m      Reproductive/Obstetrics negative OB ROS  Anesthesia Physical Anesthesia Plan  ASA: 3  Anesthesia Plan: General ETT   Post-op Pain Management:    Induction: Intravenous  PONV Risk Score and Plan: Ondansetron, Dexamethasone, Midazolam and Treatment may vary due to age or medical condition  Airway Management Planned: Oral ETT  Additional Equipment:   Intra-op Plan:   Post-operative Plan: Extubation in OR  Informed Consent: I have reviewed the patients History and Physical, chart, labs and discussed the procedure including the risks, benefits and alternatives for the proposed anesthesia with the patient or authorized representative who has indicated his/her understanding and acceptance.   Patient has DNR.  Discussed DNR with patient, Suspend DNR and Discussed DNR with power of attorney.   Dental Advisory Given  Plan Discussed with: Anesthesiologist, CRNA and  Surgeon  Anesthesia Plan Comments: (History and phone consent from the patients son Meredyth Hornung at [phone]   Son consented for risks of anesthesia including but not limited to:  - adverse reactions to medications - damage to eyes, teeth, lips or other oral mucosa - nerve damage due to positioning  - sore throat or hoarseness - Damage to heart, brain, nerves, lungs, other parts of body or loss of life  He voiced understanding.)       Anesthesia Quick Evaluation

## 2021-03-18 NOTE — TOC Progression Note (Signed)
Transition of Care [facility]) - Progression Note    Patient Details  Name: Kimberly Meyer MRN: [id number] Date of Birth: 1946-01-26  Transition of Care [facility]) CM/SW [place], RN Phone Number: 03/18/2021, 2:35 PM  Clinical Narrative:    Patient comes form [facility], she had surgery today and PT will evaluate for their recommendation, TOC to follow for DC planning and assist with needs        Expected Discharge Plan and Services                                                 Social Determinants of Health (SDOH) Interventions    Readmission Risk Interventions No flowsheet data found.

## 2021-03-18 NOTE — Transfer of Care (Signed)
Immediate Anesthesia Transfer of Care Note  Patient: Kimberly Meyer  Procedure(s) Performed: ARTHROPLASTY BIPOLAR HIP (HEMIARTHROPLASTY) (Left: Hip)  Patient Location: PACU  Anesthesia Type:General  Level of Consciousness: drowsy  Airway & Oxygen Therapy: Patient Spontanous Breathing and Patient connected to face mask oxygen  Post-op Assessment: Report given to RN and Post -op Vital signs reviewed and stable  Post vital signs: Reviewed and stable  Last Vitals:  Vitals Value Taken Time  BP 142/85 03/18/21 1217  Temp    Pulse 77 03/18/21 1220  Resp 14 03/18/21 1220  SpO2 100 % 03/18/21 1220  Vitals shown include unvalidated device data.  Last Pain:  Vitals:   03/18/21 0941  TempSrc: Temporal  PainSc: 0-No pain         Complications: No notable events documented.

## 2021-03-18 NOTE — Consult Note (Signed)
Neurosurgery-New Consultation Evaluation 03/18/2021 Kimberly Meyer [id number]  Identifying Statement: Kimberly Meyer is a 75 y.o. female from [place] [address] with compression fracture  Physician Requesting Consultation: [facility]  History of Present Illness: Kimberly Meyer is here for evaluation after a fall with resulting hip fracture and lumbar fracture. She has mild cognitive changes but is cooperative. She denies any back or leg pain at this time. She notices no new weakness or numbness in legs. She is having some back pain. She does have history of prior kyphoplasty at T11 and T12 with new fracture at L1. 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 We are asked to evaluate given these findings  Past Medical History:  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HCC)    Anemia    Anxiety    Chronic tension headache    Depression    GERD (gastroesophageal reflux disease)    History of pulmonary embolus (PE)    Hyperlipemia    Hypertension    IBS (irritable bowel syndrome)    Kidney stone    Low back pain    Situational stress     Social History: Social History   Socioeconomic History   Marital status: Married    Spouse name: Not on file   Number of children: Not on file   Years of education: Not on file   Highest education level: Not on file  Occupational History   Not on file  Tobacco Use   Smoking status: Former    Types: Cigarettes   Smokeless tobacco: Never  Vaping Use   Vaping Use: Never used  Substance and Sexual Activity   Alcohol use: No    Alcohol/week: 0.0 standard drinks   Drug use: No   Sexual activity: Not on file  Other Topics Concern   Not on file  Social History Narrative   Not on file   Social Determinants of Health   Financial Resource Strain: Not on file  Food Insecurity: Not on file  Transportation Needs: Not on file  Physical Activity: Not on file  Stress: Not on file  Social  Connections: Not on file  Intimate Partner Violence: Not on file    Family History: Family History  Problem Relation Age of Onset   Alzheimer's disease Father    Kidney Stones Father    Stroke Mother    Diabetes type II Mother    Breast cancer Neg Hx     Review of Systems:  Review of Systems - General ROS: Negative Psychological ROS: Negative Ophthalmic ROS: Negative ENT ROS: Negative Hematological and Lymphatic ROS: Negative  Endocrine ROS: Negative Respiratory ROS: Negative Cardiovascular ROS: Negative Gastrointestinal ROS: Negative Genito-Urinary ROS: Negative Musculoskeletal ROS: Positive for back pain Neurological ROS: Negative for leg numbness or weakness Dermatological ROS: Negative  Physical Exam: BP 107/78 (BP Location: Right Arm)   Pulse 80   Temp 97.6 F (36.4 C)   Resp 14   Ht 4\' 10"  (1.473 m)   Wt 40.8 kg   SpO2 96%   BMI 18.81 kg/m  Body mass index is 18.81 kg/m. Body surface area is 1.29 meters squared. General appearance: Alert, cooperative, in no acute distress Head: Normocephalic, atraumatic Eyes: Normal, EOM intact Oropharynx: Moist without lesions Ext: No edema in LE bilaterally  Neurologic exam:  Mental status: alertness: alert, affect: normal Speech: fluent and clear  Motor:strength symmetric 5/5 in bilateral hip flexion, knee extension, dorsi/plantar flexion Sensory: intact  to light touch in bilateral legs Gait: not tested  Laboratory: Results for orders placed or performed during the hospital encounter of 03/17/21  Resp Panel by RT-PCR (Flu A&B, Covid) Nasopharyngeal Swab   Specimen: Nasopharyngeal Swab; Nasopharyngeal(NP) swabs in vial transport medium  Result Value Ref Range   SARS Coronavirus 2 by RT PCR NEGATIVE NEGATIVE   Influenza A by PCR NEGATIVE NEGATIVE   Influenza B by PCR NEGATIVE NEGATIVE  Surgical PCR screen   Specimen: Nasal Mucosa; Nasal Swab  Result Value Ref Range   MRSA, PCR NEGATIVE NEGATIVE    Staphylococcus aureus NEGATIVE NEGATIVE  Basic metabolic panel  Result Value Ref Range   Sodium 136 135 - 145 mmol/L   Potassium 4.7 3.5 - 5.1 mmol/L   Chloride 98 98 - 111 mmol/L   CO2 29 22 - 32 mmol/L   Glucose, Bld 209 (H) 70 - 99 mg/dL   BUN 24 (H) 8 - 23 mg/dL   Creatinine, Ser 0.95 0.44 - 1.00 mg/dL   Calcium 9.0 8.9 - 10.3 mg/dL   GFR, Estimated >60 >60 mL/min   Anion gap 9 5 - 15  CBC  Result Value Ref Range   WBC 8.6 4.0 - 10.5 K/uL   RBC 5.24 (H) 3.87 - 5.11 MIL/uL   Hemoglobin 15.3 (H) 12.0 - 15.0 g/dL   HCT 47.5 (H) 36.0 - 46.0 %   MCV 90.6 80.0 - 100.0 fL   MCH 29.2 26.0 - 34.0 pg   MCHC 32.2 30.0 - 36.0 g/dL   RDW 12.3 11.5 - 15.5 %   Platelets 268 150 - 400 K/uL   nRBC 0.0 0.0 - 0.2 %  Protime-INR  Result Value Ref Range   Prothrombin Time 14.1 11.4 - 15.2 seconds   INR 1.1 0.8 - 1.2  APTT  Result Value Ref Range   aPTT 26 24 - 36 seconds  Glucose, capillary  Result Value Ref Range   Glucose-Capillary 154 (H) 70 - 99 mg/dL  CBC  Result Value Ref Range   WBC 7.9 4.0 - 10.5 K/uL   RBC 4.60 3.87 - 5.11 MIL/uL   Hemoglobin 13.9 12.0 - 15.0 g/dL   HCT 41.9 36.0 - 46.0 %   MCV 91.1 80.0 - 100.0 fL   MCH 30.2 26.0 - 34.0 pg   MCHC 33.2 30.0 - 36.0 g/dL   RDW 12.3 11.5 - 15.5 %   Platelets 233 150 - 400 K/uL   nRBC 0.0 0.0 - 0.2 %  Glucose, capillary  Result Value Ref Range   Glucose-Capillary 213 (H) 70 - 99 mg/dL  Glucose, capillary  Result Value Ref Range   Glucose-Capillary 170 (H) 70 - 99 mg/dL  Type and screen [facility]  Result Value Ref Range   ABO/RH(D) O POS    Antibody Screen NEG    Sample Expiration      03/20/2021,2359 Performed at [facility], [address], [place], [place] [address]   ABO/Rh  Result Value Ref Range   ABO/RH(D)      O POS Performed at [facility], [address], [place], [place] [address]    I personally reviewed labs  Imaging: CT Lumbar spine: Acute L1 burst  fracture with approximately 60% height loss and mild bony retropulsion measuring up to 5 mm. Unchanged prior vertebroplasties at T11 and T12 without evidence of progressive height loss. Unchanged compression deformities of L4 and L5.  Multilevel degenerative disc disease and facet arthropathy with disc  bulging in the lower lumbar spine resulting in varying degrees of spinal canal or neural foraminal narrowing, worst at L3-L4 and L5-S1.   Impression/Plan:  Kimberly Biagini is here for a L1 fracture and hip fracture. I reviewed CT and there is minimal alignment changes and this could be a severe compression fracture or burst type but does not appear unstable. She is neurologically intact and therefore no surgery is recommended. She does need bracing and a TLSO is needed. We can follow along as outpatient for healing.    1.  Diagnosis: L1 fracture   2.  Plan - TLSO brace whenever out of bed - Follow up 3 weeks as outpatient   Deetta Perla, MD

## 2021-03-18 NOTE — Evaluation (Signed)
Physical Therapy Evaluation Patient Details Name: Kimberly Meyer MRN: [id number] DOB: 07-19-1945 Today's Date: 03/18/2021  History of Present Illness  75 y.o. female with medical history significant of hypertension, hyperlipidemia, diabetes mellitus, GERD, depression, anxiety, kidney stone, IBS, remote PE not on anticoagulants, ambulatory carcinoma, colitis, dementia, who presents with fall, left hip pain and back pain. Imaging reveals left displaced femoral neck fracture (s/p hemiarthroplasty 11/22) and L1 compression fracture (to be treated non-surgically).  Clinical Impression  Pt received supine in bed, agreeable to therapy. She remained confused throughout session in regards to place and situation; pt was unaware she had surgery today and forgetful once told. She lives in assisted living - unsure of level of function prior to fall/injury. Bed mobility was completed with MIN-MAX A - pt responds best to single step commands and visual demonstration. Pt demonstrates limited sitting balance with tendency to lean backwards - CGA-MOD A to maintain seated position. Pt declined STS transfer this date. Would benefit from skilled PT to address above deficits and promote optimal return to PLOF.        Recommendations for follow up therapy are one component of a multi-disciplinary discharge planning process, led by the attending physician.  Recommendations may be updated based on patient status, additional functional criteria and insurance authorization.  Follow Up Recommendations Skilled nursing-short term rehab (<3 hours/day)    Assistance Recommended at Discharge Frequent or constant Supervision/Assistance  Functional Status Assessment Patient has had a recent decline in their functional status and demonstrates the ability to make significant improvements in function in a reasonable and predictable amount of time.  Equipment Recommendations  Other (comment) (TBD at next venue of care)     Recommendations for Other Services       Precautions / Restrictions Precautions Precautions: Anterior Hip;Back;Fall Precaution Booklet Issued: No Precaution Comments: TLSO on for all out of bed mobility. No hip precautions. Restrictions Weight Bearing Restrictions: Yes LLE Weight Bearing: Weight bearing as tolerated      Mobility  Bed Mobility Overal bed mobility: Needs Assistance Bed Mobility: Supine to Sit;Sit to Supine     Supine to sit: Min assist;HOB elevated Sit to supine: Max assist   General bed mobility comments: MIN A to ensure trunk control - tendency to lean backwards. VC to perform log roll.  MAX A to manage BLE and trunk back to bed.    Transfers                   General transfer comment: deferred - pt asks to return to bed    Ambulation/Gait                  Stairs            Wheelchair Mobility    Modified Rankin (Stroke Patients Only)       Balance Overall balance assessment: Needs assistance Sitting-balance support: Bilateral upper extremity supported;Feet supported Sitting balance-Leahy Scale: Poor Sitting balance - Comments: CGA-MOD A to sit EOB due to posterior lean with decreased righting strategies Postural control: Posterior lean                                   Pertinent Vitals/Pain Pain Assessment: Faces Faces Pain Scale: Hurts even more Pain Location: L hip, low back Pain Descriptors / Indicators: Aching;Discomfort;Grimacing;Sore Pain Intervention(s): Limited activity within patient's tolerance;Monitored during session;Repositioned    Home Living Family/patient expects to be  discharged to:: Assisted living                   Additional Comments: unsure - pt unable to answer this date    Prior Function Prior Level of Function : Patient poor historian/Family not available;History of Falls (last six months)             Mobility Comments: H&P reports multiple recent falls. Pt  unable to provide accurate PLOF information. Pt lives in [place].       Hand Dominance        Extremity/Trunk Assessment   Upper Extremity Assessment Upper Extremity Assessment: Overall WFL for tasks assessed    Lower Extremity Assessment Lower Extremity Assessment: LLE deficits/detail (RLE WFL) LLE Deficits / Details: s/p L hemiarthroplasty - able to lift against gravity through decreased ROM       Communication   Communication: No difficulties  Cognition Arousal/Alertness: Awake/alert Behavior During Therapy: WFL for tasks assessed/performed Overall Cognitive Status: History of cognitive impairments - at baseline                                 General Comments: Oriented to self only. Stated she was at home multiple times, even after PT reminded pt she was in the hospital. Pt unaware she had surgery today however does remember falling.        General Comments      Exercises     Assessment/Plan    PT Assessment Patient needs continued PT services  PT Problem List Decreased strength;Decreased mobility;Decreased safety awareness;Decreased range of motion;Decreased activity tolerance;Decreased knowledge of precautions;Decreased balance;Decreased knowledge of use of DME;Pain       PT Treatment Interventions DME instruction;Therapeutic activities;Gait training;Therapeutic exercise;Patient/family education;Balance training;Functional mobility training;Neuromuscular re-education    PT Goals (Current goals can be found in the Care Plan section)  Acute Rehab PT Goals Patient Stated Goal: to get better PT Goal Formulation: With patient Time For Goal Achievement: 04/01/21 Potential to Achieve Goals: Fair    Frequency BID   Barriers to discharge        Co-evaluation               AM-PAC PT "6 Clicks" Mobility  Outcome Measure Help needed turning from your back to your side while in a flat bed without using bedrails?: A Little Help needed moving  from lying on your back to sitting on the side of a flat bed without using bedrails?: A Little Help needed moving to and from a bed to a chair (including a wheelchair)?: A Lot Help needed standing up from a chair using your arms (e.g., wheelchair or bedside chair)?: A Lot Help needed to walk in hospital room?: A Lot Help needed climbing 3-5 steps with a railing? : Total 6 Click Score: 13    End of Session   Activity Tolerance: Patient limited by pain Patient left: in bed;with call bell/phone within reach;with bed alarm set Nurse Communication: Mobility status PT Visit Diagnosis: Unsteadiness on feet (R26.81);Other abnormalities of gait and mobility (R26.89);Repeated falls (R29.6);Muscle weakness (generalized) (M62.81);History of falling (Z91.81);Difficulty in walking, not elsewhere classified (R26.2)    Time: 4128-7867 PT Time Calculation (min) (ACUTE ONLY): 21 min   Charges:   PT Evaluation $PT Eval Moderate Complexity: 1 Mod PT Treatments $Therapeutic Activity: 8-22 mins        Patrina Levering PT, DPT 03/18/21 5:51 PM [phone]

## 2021-03-18 NOTE — Anesthesia Postprocedure Evaluation (Signed)
Anesthesia Post Note  Patient: Kimberly Meyer  Procedure(s) Performed: ARTHROPLASTY BIPOLAR HIP (HEMIARTHROPLASTY) (Left: Hip)  Patient location during evaluation: PACU Anesthesia Type: General Level of consciousness: awake and alert Pain management: pain level controlled Vital Signs Assessment: post-procedure vital signs reviewed and stable Respiratory status: spontaneous breathing, nonlabored ventilation, respiratory function stable and patient connected to nasal cannula oxygen Cardiovascular status: blood pressure returned to baseline and stable Postop Assessment: no apparent nausea or vomiting Anesthetic complications: no   No notable events documented.   Last Vitals:  Vitals:   03/18/21 1251 03/18/21 1310  BP: 137/79 140/84  Pulse: 77 75  Resp: 16 16  Temp: (!) 36.1 C (!) 36.3 C  SpO2: 95% 97%    Last Pain:  Vitals:   03/18/21 1251  TempSrc:   PainSc: 0-No pain                 Precious Haws [place] Zucco

## 2021-03-18 NOTE — Progress Notes (Signed)
PROGRESS NOTE    Kimberly Meyer  LZJ:[id number] DOB: [birth year] DOA: 03/17/2021 PCP: Marinda Elk, MD    Brief Narrative:  75 y.o. female with medical history significant of hypertension, hyperlipidemia, diabetes mellitus, GERD, depression, anxiety, kidney stone, IBS, remote PE not on anticoagulants, ambulatory carcinoma, colitis, dementia, who presents with fall, left hip pain and back pain.   Per report, patient has had multiple falls over the past 2 weeks.  This is likely due to increased weakness.  Most recently he fell last night and developed pain in her back and the left hip.  Left leg is shortened. He has not been able to ambulate since her recent fall.  Patient seems to have severe lower back pain and left hip pain.  She denies numbness in extremities. Patient denies chest pain, shortness breath, coughing, fever or chills.  No nausea, vomiting, diarrhea or abdominal pain.  No symptoms of UTI.  Patient has dementia, but she is alert, mental status seem to be at baseline.  Plan for operative fixation of hip fracture 11/22.  Orthopedics on board.  Neurosurgical consult appreciated for lumbar vertebral compression fracture.  Recommend TLSO brace while upright.  No surgical intervention warranted.   Assessment & Plan:   Principal Problem:   Closed left hip fracture ([place]) Active Problems:   Pulmonary embolism (HCC)   HTN (hypertension)   Diabetes mellitus without complication (HCC)   Dementia (HCC)   Depression with anxiety   Fracture of lumbar spine ([place])   Fracture of thoracic spine ([place])  Closed left hip fracture and displaced femoral neck fracture  Dr. Rudene Christians of ortho is consulted Plan for operative fixation 11/22 Plan: N.p.o. 4 OR Multimodal pain control Therapy evaluations on postop day 1 VTE prophylaxis postop day 1    Fracture of lumbar spine and fracture of thoracic spine  Dr. Lacinda Axon of neurosurgery is consulted. Applied TLSO.  -Pain control as above -No  surgical intervention warranted -Continue with TLSO brace while upright -Outpatient follow-up with neurosurgery 3 weeks   Hx of pulmonary embolism ([place])  Per patient's son, patient had pulmonary embolism after surgery in the past.  Currently patient does not have any chest pain or shortness breath. -No clinical indication of PE   HTN (hypertension) -IV hydralazine as needed -Lisinopril   Diabetes mellitus without complication (HCC)  K2I 7.6 on 1/22.  Patient is not taking medications currently.   Blood sugar mildly elevated -Start sliding scale insulin   Dementia (HCC) -Donepezil   Depression with anxiety -Continue home medications   DVT prophylaxis: SCD Code Status: DNR Family Communication: None today Disposition Plan: Status is: Inpatient  Remains inpatient appropriate because: Hip fracture.  Plan for operative fixation 11/22.  Therapy evaluations to start postoperative day #1.  Anticipate need for skilled nursing facility placement.       Level of care: Med-Surg  Consultants:  Orthopedic surgery Neurosurgery  Procedures:  Hip fracture repair 11/22  Antimicrobials: None   Subjective: Seen and examined.  Resting comfortably in bed.  No visible distress.  Pain well managed  Objective: Vitals:   03/18/21 1242 03/18/21 1246 03/18/21 1251 03/18/21 1310  BP:  137/79 137/79 140/84  Pulse: 76 71 77 75  Resp: 15 11 16 16   Temp:   (!) 97 F (36.1 C) (!) 97.4 F (36.3 C)  TempSrc:      SpO2: 100% 99% 95% 97%  Weight:      Height:        Intake/Output Summary (  Last 24 hours) at 03/18/2021 1442 Last data filed at 03/18/2021 1222 Gross per 24 hour  Intake 500 ml  Output 1325 ml  Net -825 ml   Filed Weights   03/17/21 1049  Weight: 40.8 kg    Examination:  General exam: Appears calm and comfortable  Respiratory system: Clear to auscultation. Respiratory effort normal. Cardiovascular system: S1-S2, RRR, no murmurs, no pedal edema Gastrointestinal  system: Soft, NT/ND, normal bowel sounds Central nervous system: Alert and oriented. No focal neurological deficits. Extremities: Left leg shortened.  Sensation intact.  Palpable pulses Skin: No rashes, lesions or ulcers Psychiatry: Judgement and insight appear normal. Mood & affect appropriate.     Data Reviewed: I have personally reviewed following labs and imaging studies  CBC: Recent Labs  Lab 03/17/21 1046 03/18/21 0336  WBC 8.6 7.9  HGB 15.3* 13.9  HCT 47.5* 41.9  MCV 90.6 91.1  PLT 268 604   Basic Metabolic Panel: Recent Labs  Lab 03/17/21 1046 03/18/21 0336  NA 136  --   K 4.7  --   CL 98  --   CO2 29  --   GLUCOSE 209*  --   BUN 24*  --   CREATININE 0.95 0.95  CALCIUM 9.0  --    GFR: Estimated Creatinine Clearance: 31.9 mL/min (by C-G formula based on SCr of 0.95 mg/dL). Liver Function Tests: No results for input(s): AST, ALT, ALKPHOS, BILITOT, PROT, ALBUMIN in the last 168 hours. No results for input(s): LIPASE, AMYLASE in the last 168 hours. No results for input(s): AMMONIA in the last 168 hours. Coagulation Profile: Recent Labs  Lab 03/17/21 1046  INR 1.1   Cardiac Enzymes: No results for input(s): CKTOTAL, CKMB, CKMBINDEX, TROPONINI in the last 168 hours. BNP (last 3 results) No results for input(s): PROBNP in the last 8760 hours. HbA1C: No results for input(s): HGBA1C in the last 72 hours. CBG: Recent Labs  Lab 03/17/21 2017 03/18/21 0733 03/18/21 0953 03/18/21 1220 03/18/21 1312  GLUCAP 213* 170* 165* 192* 173*   Lipid Profile: No results for input(s): CHOL, HDL, LDLCALC, TRIG, CHOLHDL, LDLDIRECT in the last 72 hours. Thyroid Function Tests: No results for input(s): TSH, T4TOTAL, FREET4, T3FREE, THYROIDAB in the last 72 hours. Anemia Panel: No results for input(s): VITAMINB12, FOLATE, FERRITIN, TIBC, IRON, RETICCTPCT in the last 72 hours. Sepsis Labs: No results for input(s): PROCALCITON, LATICACIDVEN in the last 168  hours.  Recent Results (from the past 240 hour(s))  Resp Panel by RT-PCR (Flu A&B, Covid) Nasopharyngeal Swab     Status: None   Collection Time: 03/17/21  3:37 PM   Specimen: Nasopharyngeal Swab; Nasopharyngeal(NP) swabs in vial transport medium  Result Value Ref Range Status   SARS Coronavirus 2 by RT PCR NEGATIVE NEGATIVE Final    Comment: (NOTE) SARS-CoV-2 target nucleic acids are NOT DETECTED.  The SARS-CoV-2 RNA is generally detectable in upper respiratory specimens during the acute phase of infection. The lowest concentration of SARS-CoV-2 viral copies this assay can detect is 138 copies/mL. A negative result does not preclude SARS-Cov-2 infection and should not be used as the sole basis for treatment or other patient management decisions. A negative result may occur with  improper specimen collection/handling, submission of specimen other than nasopharyngeal swab, presence of viral mutation(s) within the areas targeted by this assay, and inadequate number of viral copies(<138 copies/mL). A negative result must be combined with clinical observations, patient history, and epidemiological information. The expected result is Negative.  Fact Sheet for  Patients:  EntrepreneurPulse.com.au  Fact Sheet for Healthcare Providers:  IncredibleEmployment.be  This test is no t yet approved or cleared by the [place] FDA and  has been authorized for detection and/or diagnosis of SARS-CoV-2 by FDA under an Emergency Use Authorization (EUA). This EUA will remain  in effect (meaning this test can be used) for the duration of the COVID-19 declaration under Section 564(b)(1) of the Act, 21 U.S.C.section 360bbb-3(b)(1), unless the authorization is terminated  or revoked sooner.       Influenza A by PCR NEGATIVE NEGATIVE Final   Influenza B by PCR NEGATIVE NEGATIVE Final    Comment: (NOTE) The Xpert Xpress SARS-CoV-2/FLU/RSV plus assay is intended  as an aid in the diagnosis of influenza from Nasopharyngeal swab specimens and should not be used as a sole basis for treatment. Nasal washings and aspirates are unacceptable for Xpert Xpress SARS-CoV-2/FLU/RSV testing.  Fact Sheet for Patients: EntrepreneurPulse.com.au  Fact Sheet for Healthcare Providers: IncredibleEmployment.be  This test is not yet approved or cleared by the [place] FDA and has been authorized for detection and/or diagnosis of SARS-CoV-2 by FDA under an Emergency Use Authorization (EUA). This EUA will remain in effect (meaning this test can be used) for the duration of the COVID-19 declaration under Section 564(b)(1) of the Act, 21 U.S.C. section 360bbb-3(b)(1), unless the authorization is terminated or revoked.  Performed at [facility], [address], [place], [place] [address]   Surgical PCR screen     Status: None   Collection Time: 03/17/21  5:57 PM   Specimen: Nasal Mucosa; Nasal Swab  Result Value Ref Range Status   MRSA, PCR NEGATIVE NEGATIVE Final   Staphylococcus aureus NEGATIVE NEGATIVE Final    Comment: (NOTE) The Xpert SA Assay (FDA approved for NASAL specimens in patients 57 years of age and older), is one component of a comprehensive surveillance program. It is not intended to diagnose infection nor to guide or monitor treatment. Performed at [facility], [address], [place], [place] [address]          Radiology Studies: CT HEAD WO CONTRAST (5MM)  Result Date: 03/17/2021 CLINICAL DATA:  Head trauma, mod-severe; Head trauma, minor (Age >= 65y) EXAM: CT HEAD WITHOUT CONTRAST CT CERVICAL SPINE WITHOUT CONTRAST TECHNIQUE: Multidetector CT imaging of the head and cervical spine was performed following the standard protocol without intravenous contrast. Multiplanar CT image reconstructions of the cervical spine were also generated. COMPARISON:  None. FINDINGS: CT HEAD  FINDINGS Brain: No evidence of acute infarction, hemorrhage, hydrocephalus, extra-axial collection or mass lesion/mass effect. Moderate patchy white matter hypodensities, nonspecific but compatible with chronic microvascular ischemic disease. Moderate atrophy with ex vacuo ventricular dilation. Vascular: No hyperdense vessel identified. Calcific intracranial atherosclerosis. Skull: No acute fracture. Sinuses/Orbits: Clear sinuses.  Unremarkable orbits. Other: No mastoid effusions. CT CERVICAL SPINE FINDINGS Alignment: Mild retrolisthesis of C5 on C6, likely degenerative in etiology given severe degenerative changes at this level. Mild anterolisthesis of C6 on C7 and C7 on T1, also likely degenerative given marked facet arthropathy at these levels. Skull base and vertebrae: Approximately 20% height loss of the T2 vertebral body with associated superior endplate trabecular sclerosis, suggestive of acute compression fracture. Probable lucency through the anterior/superior endplate. Partial osseous fusion of the posterior left C1 arch and skull base. Corticated defect in the posterior C1 arch, likely congenital or remote. Soft tissues and spinal canal: No prevertebral fluid or swelling. No visible canal hematoma. Disc levels: Severe degenerative disease at C5-C6.  Multilevel facet arthropathy. Upper chest: Biapical pleuroparenchymal scarring. Otherwise, visualized lung apices are clear. IMPRESSION: CT cervical spine: 1. Partially imaged T2 compression fracture, likely acute. Associated 20% height loss. Recommend dedicated thoracic spine CT for further evaluation. 2. Severe multilevel degenerative change, detailed above. 3. Partial left posterior atlanto-occipital assimilation. CT head: 1. No evidence of acute intracranial abnormality. 2. Moderate chronic microvascular ischemic disease and atrophy. Electronically Signed   By: Margaretha Sheffield M.D.   On: 03/17/2021 13:15   CT Cervical Spine Wo Contrast  Result Date:  03/17/2021 CLINICAL DATA:  Head trauma, mod-severe; Head trauma, minor (Age >= 65y) EXAM: CT HEAD WITHOUT CONTRAST CT CERVICAL SPINE WITHOUT CONTRAST TECHNIQUE: Multidetector CT imaging of the head and cervical spine was performed following the standard protocol without intravenous contrast. Multiplanar CT image reconstructions of the cervical spine were also generated. COMPARISON:  None. FINDINGS: CT HEAD FINDINGS Brain: No evidence of acute infarction, hemorrhage, hydrocephalus, extra-axial collection or mass lesion/mass effect. Moderate patchy white matter hypodensities, nonspecific but compatible with chronic microvascular ischemic disease. Moderate atrophy with ex vacuo ventricular dilation. Vascular: No hyperdense vessel identified. Calcific intracranial atherosclerosis. Skull: No acute fracture. Sinuses/Orbits: Clear sinuses.  Unremarkable orbits. Other: No mastoid effusions. CT CERVICAL SPINE FINDINGS Alignment: Mild retrolisthesis of C5 on C6, likely degenerative in etiology given severe degenerative changes at this level. Mild anterolisthesis of C6 on C7 and C7 on T1, also likely degenerative given marked facet arthropathy at these levels. Skull base and vertebrae: Approximately 20% height loss of the T2 vertebral body with associated superior endplate trabecular sclerosis, suggestive of acute compression fracture. Probable lucency through the anterior/superior endplate. Partial osseous fusion of the posterior left C1 arch and skull base. Corticated defect in the posterior C1 arch, likely congenital or remote. Soft tissues and spinal canal: No prevertebral fluid or swelling. No visible canal hematoma. Disc levels: Severe degenerative disease at C5-C6. Multilevel facet arthropathy. Upper chest: Biapical pleuroparenchymal scarring. Otherwise, visualized lung apices are clear. IMPRESSION: CT cervical spine: 1. Partially imaged T2 compression fracture, likely acute. Associated 20% height loss. Recommend  dedicated thoracic spine CT for further evaluation. 2. Severe multilevel degenerative change, detailed above. 3. Partial left posterior atlanto-occipital assimilation. CT head: 1. No evidence of acute intracranial abnormality. 2. Moderate chronic microvascular ischemic disease and atrophy. Electronically Signed   By: Margaretha Sheffield M.D.   On: 03/17/2021 13:15   CT Thoracic Spine Wo Contrast  Result Date: 03/17/2021 CLINICAL DATA:  Back pain. Multiple falls over the last 2 weeks with increasing weakness. EXAM: CT THORACIC SPINE WITHOUT CONTRAST TECHNIQUE: Multidetector CT images of the thoracic were obtained using the standard protocol without intravenous contrast. COMPARISON:  CTs of the cervical and lumbar spine earlier today. Abdominopelvic CT 01/10/2021, thoracic spine MRI 05/10/2015 and thoracic radiographs 06/04/2015. FINDINGS: Alignment: Near anatomic. There is a slight degenerative anterolisthesis at C6-7 and C7-T1. Vertebrae: There are multiple thoracic compression deformities. As seen on earlier cervical spine CT, there is a mild superior endplate compression fracture at T2 with approximately 20% loss of vertebral body height and endplate sclerosis. Similar mild superior endplate compression fractures are present at T3 and T4. There is a superior endplate compression fracture at T9 which does not appear acute. Patient is status post spinal augmentation at T11 and T12. A burst fracture at L1 is noted, described on earlier lumbar spine CT. The posterior elements appear intact. Paraspinal and other soft tissues: No significant paraspinal or epidural fluid collections are identified. Mild pulmonary emphysematous changes with  scattered calcified granulomas. Disc levels: Multilevel thoracic spondylosis with disc and endplate degeneration and scattered mild facet degenerative changes. There are small disc protrusions, most notable centrally at T4-5, T5-6 and T6-7. No large disc herniation or high-grade  spinal stenosis identified. IMPRESSION: 1. Mild superior endplate compression deformities at T2, T3 and T4, likely acute. Each of these fractures is associated with approximately 20% loss of vertebral body height. 2. Superior endplate compression deformity at T9 does not appear acute. 3. Previous spinal augmentation for fractures at T11 and T12. Known burst fracture at L1, described on earlier lumbar spine CT. 4. Mild multilevel spondylosis with small disc protrusions. No high-grade spinal stenosis. Electronically Signed   By: Richardean Sale M.D.   On: 03/17/2021 15:15   CT Lumbar Spine Wo Contrast  Result Date: 03/17/2021 CLINICAL DATA:  Low back pain, trauma, multiple falls EXAM: CT LUMBAR SPINE WITHOUT CONTRAST TECHNIQUE: Multidetector CT imaging of the lumbar spine was performed without intravenous contrast administration. Multiplanar CT image reconstructions were also generated. COMPARISON:  CT 01/10/2021 FINDINGS: Segmentation: 5 lumbar type vertebrae. Alignment: Normal Vertebrae: Prior vertebroplasties at T11 and T12 without evidence of progressive height loss. There is an acute L1 burst fracture with approximately 60% height loss and bony retropulsion measuring up to 5 mm. Unchanged compression deformities of L4 and L5, with mild retro portion of the superior endplate of L4. Paraspinal and other soft tissues: Prior cholecystectomy. Partially visualized pneumobilia, likely related to reported prior Whipple procedure. There is a nonobstructive 5 mm stone in the left kidney. Unchanged low-density left adrenal nodule, statistically likely to be an adenoma. Disc levels: There is moderate to severe disc height loss at L5-S1. There is disc bulging at L3-L4, L4-L5, and L5-S1 along with severe bilateral facet arthropathy resulting in varying degrees spinal canal and neural foraminal narrowing, most severe canal narrowing at L3-L4 and worst neural foraminal narrowing at L5-S1. IMPRESSION: Acute L1 burst fracture  with approximately 60% height loss and mild bony retropulsion measuring up to 5 mm. Unchanged prior vertebroplasties at T11 and T12 without evidence of progressive height loss. Unchanged compression deformities of L4 and L5. Multilevel degenerative disc disease and facet arthropathy with disc bulging in the lower lumbar spine resulting in varying degrees of spinal canal or neural foraminal narrowing, worst at L3-L4 and L5-S1. Prior Whipple procedure with pneumobilia. Electronically Signed   By: Maurine Simmering M.D.   On: 03/17/2021 13:11   DG Chest Portable 1 View  Result Date: 03/17/2021 CLINICAL DATA:  Preop for hip fracture EXAM: PORTABLE CHEST 1 VIEW COMPARISON:  01/16/2014 FINDINGS: Numerous leads and wires project over the chest. Patient rotated minimally right. Midline trachea. Normal heart size and mediastinal contours for age. No pleural effusion or pneumothorax. Clear lungs. Osteopenia. Vertebral augmentation at 2 lower thoracic levels. Surgical clips in the upper abdomen. IMPRESSION: No acute cardiopulmonary disease. Electronically Signed   By: Abigail Miyamoto M.D.   On: 03/17/2021 14:53   DG C-Arm 1-60 Min-No Report  Result Date: 03/18/2021 Fluoroscopy was utilized by the requesting physician.  No radiographic interpretation.   DG HIP UNILAT WITH PELVIS 1V LEFT  Result Date: 03/18/2021 CLINICAL DATA:  Left hip arthroplasty EXAM: DG HIP (WITH OR WITHOUT PELVIS) 1V*L* COMPARISON:  03/17/2021 FINDINGS: Fluoroscopic images show left hip arthroplasty. Fluoroscopic time was 5 seconds. Estimated radiation dose is 0.43 mGy. Two images are submitted for review. IMPRESSION: Fluoroscopic assistance was provided for left hip arthroplasty. Electronically Signed   By: Prudy Feeler.D.  On: 03/18/2021 12:47   DG HIP UNILAT W OR W/O PELVIS 2-3 VIEWS LEFT  Result Date: 03/18/2021 CLINICAL DATA:  Status post left hip replacement. EXAM: DG HIP (WITH OR WITHOUT PELVIS) 2-3V LEFT COMPARISON:   Fluoroscopic images of same day. FINDINGS: Left hip prosthesis appears to be well situated. Expected postoperative changes are seen involving the surrounding soft tissues. IMPRESSION: Status post left hip arthroplasty. Electronically Signed   By: Marijo Conception M.D.   On: 03/18/2021 12:52   DG HIP UNILAT WITH PELVIS 2-3 VIEWS LEFT  Result Date: 03/17/2021 CLINICAL DATA:  Fall, left hip pain EXAM: DG HIP (WITH OR WITHOUT PELVIS) 2-3V LEFT COMPARISON:  01/10/2021 FINDINGS: Acute left femoral neck fracture with approximately 2-3 cm of proximal displacement. Left femoral head remains aligned without dislocation. No definite fracture involvement of the intertrochanteric aspect of the proximal femur. Bones appear demineralized. Prior right hip hemiarthroplasty, grossly intact. IMPRESSION: Acute moderately displaced left femoral neck fracture. Electronically Signed   By: Davina Poke D.O.   On: 03/17/2021 12:45        Scheduled Meds:  acetaminophen  1,000 mg Oral Q6H   buPROPion  150 mg Oral BID   citalopram  10 mg Oral Daily   clonazePAM  0.5 mg Oral QHS   docusate sodium  100 mg Oral BID   donepezil  10 mg Oral QHS   [START ON 03/19/2021] enoxaparin (LOVENOX) injection  30 mg Subcutaneous Q24H   fentaNYL       insulin aspart  0-5 Units Subcutaneous QHS   insulin aspart  0-9 Units Subcutaneous TID WC   lisinopril  10 mg Oral Daily   magnesium hydroxide  30 mL Oral QHS   mupirocin ointment  1 application Nasal BID   pantoprazole  40 mg Oral Daily   Continuous Infusions:  sodium chloride      ceFAZolin (ANCEF) IV     methocarbamol (ROBAXIN) IV       LOS: 1 day    Time spent: 25 minutes    Sidney Ace, MD [facility]   If 7PM-7AM, please contact night-coverage  03/18/2021, 2:42 PM

## 2021-03-18 NOTE — Progress Notes (Signed)
PHARMACIST - PHYSICIAN COMMUNICATION  CONCERNING:  Enoxaparin (Lovenox) for DVT Prophylaxis   DESCRIPTION: Patient was prescribed enoxaprin 40mg  q24 hours for VTE prophylaxis.   Filed Weights   03/17/21 1049  Weight: 40.8 kg (90 lb)    Body mass index is 18.81 kg/m.  Estimated Creatinine Clearance: 31.9 mL/min (by C-G formula based on SCr of 0.95 mg/dL).   Patient is candidate for enoxaparin 30mg  every 24 hours based on CrCl <21ml/min or Weight <45kg  RECOMMENDATION: Pharmacy has adjusted enoxaparin dose per [facility] policy.  Patient is now receiving enoxaparin 30 mg every 34 hours    Darnelle Bos, PharmD Clinical Pharmacist  03/18/2021 1:21 PM

## 2021-03-19 DIAGNOSIS — F0390 Unspecified dementia without behavioral disturbance: Secondary | ICD-10-CM

## 2021-03-19 LAB — CBC
HCT: 34.9 % — ABNORMAL LOW (ref 36.0–46.0)
Hemoglobin: 11.7 g/dL — ABNORMAL LOW (ref 12.0–15.0)
MCH: 30.2 pg (ref 26.0–34.0)
MCHC: 33.5 g/dL (ref 30.0–36.0)
MCV: 90.2 fL (ref 80.0–100.0)
Platelets: 198 10*3/uL (ref 150–400)
RBC: 3.87 MIL/uL (ref 3.87–5.11)
RDW: 12 % (ref 11.5–15.5)
WBC: 8.8 10*3/uL (ref 4.0–10.5)
nRBC: 0 % (ref 0.0–0.2)

## 2021-03-19 LAB — GLUCOSE, CAPILLARY
Glucose-Capillary: 152 mg/dL — ABNORMAL HIGH (ref 70–99)
Glucose-Capillary: 174 mg/dL — ABNORMAL HIGH (ref 70–99)
Glucose-Capillary: 144 mg/dL — ABNORMAL HIGH (ref 70–99)
Glucose-Capillary: 114 mg/dL — ABNORMAL HIGH (ref 70–99)

## 2021-03-19 LAB — BASIC METABOLIC PANEL
Anion gap: 6 (ref 5–15)
BUN: 27 mg/dL — ABNORMAL HIGH (ref 8–23)
CO2: 27 mmol/L (ref 22–32)
Calcium: 8 mg/dL — ABNORMAL LOW (ref 8.9–10.3)
Chloride: 100 mmol/L (ref 98–111)
Creatinine, Ser: 0.84 mg/dL (ref 0.44–1.00)
GFR, Estimated: >60 mL/min (ref >60)
Glucose, Bld: 155 mg/dL — ABNORMAL HIGH (ref 70–99)
Potassium: 4.1 mmol/L (ref 3.5–5.1)
Sodium: 133 mmol/L — ABNORMAL LOW (ref 135–145)

## 2021-03-19 MED ORDER — OXYCODONE HCL 5 MG PO TABS: 5.0000 mg | ORAL_TABLET | ORAL | 0 refills | Status: DC | PRN

## 2021-03-19 MED ORDER — DOCUSATE SODIUM 100 MG PO CAPS: 100.0000 mg | ORAL_CAPSULE | Freq: Two times a day (BID) | ORAL | 0 refills | Status: AC

## 2021-03-19 MED ORDER — ENOXAPARIN SODIUM 40 MG/0.4ML IJ SOSY: 40.0000 mg | PREFILLED_SYRINGE | INTRAMUSCULAR | 0 refills | Status: DC

## 2021-03-19 NOTE — Progress Notes (Signed)
Progress Note    Kimberly Meyer   ZOX:[id number]  DOB: Nov 25, 1945  DOA: 03/17/2021     2 PCP: Marinda Elk, MD  Initial CC: falls  Hospital Course: 75 y.o. female with medical history significant of hypertension, hyperlipidemia, diabetes mellitus, GERD, depression, anxiety, kidney stone, IBS, remote PE not on anticoagulants, ambulatory carcinoma, colitis, dementia, who presented with fall, left hip pain and back pain.   Per report, patient has had multiple falls over the past 2 weeks.  This is likely due to increased weakness.   Her most recent fall prior to admission resulted in lower back and left hip pain. She underwent multiple imaging studies on admission which showed an acute L1 burst fracture and an acute moderately displaced left femoral neck fracture.  She was evaluated by orthopedic surgery and underwent left hip hemiarthroplasty on 03/18/2021. She was also evaluated by neurosurgery regarding her L1 burst fracture and was recommended for nonoperative management and to wear TLSO brace when out of bed.  Interval History:  No events overnight. Sitting in recliner in no distress. Asks multiple times what the plan is as she kept forgetting. Noted pain in lower back still.   Assessment & Plan: * Closed left hip fracture ([place]) - s/p left him hemiarthroplasty on 11/22 - continue with PT; will need SNF at discharge  - per ortho:   Follow-up with [facility] orthopedics in 2 weeks  Remove Praveena on 03/27/2021 and apply honeycomb dressing  TED hose bilateral lower extremity x6 weeks  Lovenox 40 mg subcu daily x14 days discharge  Fracture of lumbar spine (HCC) - nonop management per neurosurgery - continue TLSO brace when OOB - continue pain control   Fracture of thoracic spine (HCC) - per CT T-spine: Mild superior endplate compression deformities at T2, T3 and T4, likely acute. Each of these fractures is associated with approximately 20% loss of vertebral body height. 2.  Superior endplate compression deformity at T9 does not appear acute. 3. Previous spinal augmentation for fractures at T11 and T12. Known burst fracture at L1, described on earlier lumbar spine CT. 4. Mild multilevel spondylosis with small disc protrusions. No high-grade spinal stenosis." - see L-spine fx - continue TLSO brace when OOB  Dementia ([place]) - continue aricept   History of pulmonary embolism - continue DVT ppx as noted above   Diabetes mellitus without complication ([place]) - continue SSI and CBG monitoring   Depression with anxiety - continue celexa   HTN (hypertension) - continue lisinopril    Old records reviewed in assessment of this patient  Antimicrobials:   DVT prophylaxis: Lovenox  Code Status:   Code Status: DNR  Disposition Plan:   Status is: Inpatient   Objective: Blood pressure 98/86, pulse 73, temperature 97.8 F (36.6 C), temperature source Oral, resp. rate 16, height 4\' 10"  (1.473 m), weight 40.8 kg, SpO2 98 %.  Examination:  Physical Exam Constitutional:      Appearance: She is not ill-appearing.  HENT:     Head: Normocephalic and atraumatic.     Mouth/Throat:     Mouth: Mucous membranes are moist.  Eyes:     Extraocular Movements: Extraocular movements intact.  Cardiovascular:     Rate and Rhythm: Normal rate and regular rhythm.  Pulmonary:     Effort: Pulmonary effort is normal.     Breath sounds: Normal breath sounds.  Abdominal:     General: Bowel sounds are normal. There is no distension.     Palpations: Abdomen is  soft.     Tenderness: There is no abdominal tenderness.  Musculoskeletal:     Cervical back: Normal range of motion and neck supple.     Comments: Limited ROM in LLE due to pain  Skin:    General: Skin is warm and dry.  Neurological:     Mental Status: She is alert.     Comments: Underlying dementia appreciated; follows commands  Psychiatric:        Behavior: Behavior normal.     Consultants:   Ortho Neurosurgery  Procedures:  Left hip hemiarthroplasty, 03/18/2021  Data Reviewed: I have personally reviewed labs and imaging studies     LOS: 2 days  Time spent: Greater than 50% of the 35 minute visit was spent in counseling/coordination of care for the patient as laid out in the A&P.   Dwyane Dee, MD [facility] 03/19/2021, 2:52 PM

## 2021-03-19 NOTE — Hospital Course (Addendum)
75 y.o. female with medical history significant of hypertension, hyperlipidemia, diabetes mellitus, GERD, depression, anxiety, kidney stone, IBS, remote PE not on anticoagulants, ambulatory carcinoma, colitis, dementia, who presented with fall, left hip pain and back pain.   Per report, patient has had multiple falls over the past 2 weeks.  This is likely due to increased weakness.   Her most recent fall prior to admission resulted in lower back and left hip pain. She underwent multiple imaging studies on admission which showed an acute L1 burst fracture and an acute moderately displaced left femoral neck fracture.  She was evaluated by orthopedic surgery and underwent left hip hemiarthroplasty on 03/18/2021. She was also evaluated by neurosurgery regarding her L1 burst fracture and was recommended for nonoperative management and to wear TLSO brace when out of bed. See below for further A&P.

## 2021-03-19 NOTE — TOC Initial Note (Signed)
Transition of Care [facility]) - Initial/Assessment Note    Patient Details  Name: Kimberly Meyer MRN: [id number] Date of Birth: April 29, 1945  [facility]) CM/SW Contact:    Conception Oms, RN Phone Number: 03/19/2021, 1:49 PM  Clinical Narrative:      Met with the patient and her son in the room to discuss DC plan and needs She lives at the [facility] and the PT recommends that she go to [facility] SNF she is agreeable           Bedsearch sent, once obtained will review the bed options        Patient Goals and CMS Choice        Expected Discharge Plan and Services                                                Prior Living Arrangements/Services                       Activities of Daily Living      Permission Sought/Granted                  Emotional Assessment              Admission diagnosis:  Left hip pain [M25.552] Closed left hip fracture ([place]) [S72.002A] Closed fracture of left hip, initial encounter ([place]) [S72.002A] Fall, initial encounter [W19.XXXA] Closed stable burst fracture of first lumbar vertebra, initial encounter [facility]) [S32.011A] Patient Active Problem List   Diagnosis Date Noted   Closed left hip fracture ([employer]) 03/17/2021   HTN (hypertension) 03/17/2021   Diabetes mellitus without complication ([place]) 03/83/3383   Dementia ([place]) 03/17/2021   Depression with anxiety 03/17/2021   Fracture of lumbar spine ([place]) 03/17/2021   Fracture of thoracic spine ([place]) 03/17/2021   Fall    NAFLD (nonalcoholic fatty liver disease) 05/24/2017   Age-related osteoporosis without current pathological fracture 03/03/2017   Breast asymmetry 11/19/2016   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[place]) 06/07/2015   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   Chronic tension-type headache, intractable 11/07/2014   Acid reflux 11/07/2014   HLD (hyperlipidemia) 11/07/2014   BP (high blood  pressure) 11/07/2014   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   Calculus of kidney 11/07/2014   PE (pulmonary embolism) 11/07/2014   Functional bowel disorder 11/07/2014   History of artificial joint 03/06/2014   Acute situational disturbance 02/21/2014   Pulmonary embolism ([place]) 02/27/2009   PCP:  Marinda Elk, MD Pharmacy:   [facility] [place] [address] [place] [address] Phone: [phone] Fax: [place], [place] [address] [place] [address] Phone: [phone] Fax: [place], [place] [address] [place] [address] Phone: [phone] Fax: [phone]     Social Determinants of Health (SDOH) Interventions    Readmission Risk Interventions No flowsheet data found.

## 2021-03-19 NOTE — Assessment & Plan Note (Signed)
-   continue celexa

## 2021-03-19 NOTE — Assessment & Plan Note (Addendum)
-   s/p left him hemiarthroplasty on 11/22 - per ortho:   Follow-up with [facility] orthopedics in 2 weeks  Remove Praveena on 03/27/2021 and apply honeycomb dressing  TED hose bilateral lower extremity x6 weeks  Lovenox 40 mg subcu daily x14 days discharge

## 2021-03-19 NOTE — Assessment & Plan Note (Signed)
-   per CT T-spine: Mild superior endplate compression deformities at T2, T3 and T4, likely acute. Each of these fractures is associated with approximately 20% loss of vertebral body height. 2. Superior endplate compression deformity at T9 does not appear acute. 3. Previous spinal augmentation for fractures at T11 and T12. Known burst fracture at L1, described on earlier lumbar spine CT. 4. Mild multilevel spondylosis with small disc protrusions. No high-grade spinal stenosis." - see L-spine fx - continue TLSO brace when OOB

## 2021-03-19 NOTE — Progress Notes (Signed)
   Subjective: 1 Day Post-Op Procedure(s) (LRB): ARTHROPLASTY BIPOLAR HIP (HEMIARTHROPLASTY) (Left) Patient reports pain as mild.   Patient is well, and has had no acute complaints or problems Denies any CP, SOB, ABD pain. We will continue therapy today.  Plan is to go Home after hospital stay.  Objective: Vital signs in last 24 hours: Temp:  [97 F (36.1 C)-98 F (36.7 C)] 97.5 F (36.4 C) (11/23 0747) Pulse Rate:  [71-89] 73 (11/23 0747) Resp:  [11-23] 16 (11/23 0747) BP: (107-151)/(62-85) 112/71 (11/23 0747) SpO2:  [95 %-100 %] 97 % (11/23 0747)  Intake/Output from previous day: 11/22 0701 - 11/23 0700 In: 1722 [I.V.:1622; IV Piggyback:100] Out: 25 [Blood:25] Intake/Output this shift: No intake/output data recorded.  Recent Labs    03/17/21 1046 03/18/21 0336 03/19/21 0404  HGB 15.3* 13.9 11.7*   Recent Labs    03/18/21 0336 03/19/21 0404  WBC 7.9 8.8  RBC 4.60 3.87  HCT 41.9 34.9*  PLT 233 198   Recent Labs    03/17/21 1046 03/18/21 0336 03/19/21 0404  NA 136  --  133*  K 4.7  --  4.1  CL 98  --  100  CO2 29  --  27  BUN 24*  --  27*  CREATININE 0.95 0.95 0.84  GLUCOSE 209*  --  155*  CALCIUM 9.0  --  8.0*   Recent Labs    03/17/21 1046  INR 1.1    EXAM General - Patient is Alert, Appropriate, and Oriented Extremity - Neurovascular intact Sensation intact distally Intact pulses distally Dorsiflexion/Plantar flexion intact Dressing - dressing C/D/I and no drainage, Praveena intact without drainage Motor Function - intact, moving foot and toes well on exam.   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HCC)    Anemia    Anxiety    Chronic tension headache    Depression    GERD (gastroesophageal reflux disease)    History of pulmonary embolus (PE)    Hyperlipemia    Hypertension    IBS (irritable bowel syndrome)    Kidney stone    Low back pain    Situational stress     Assessment/Plan:   1 Day Post-Op  Procedure(s) (LRB): ARTHROPLASTY BIPOLAR HIP (HEMIARTHROPLASTY) (Left) Principal Problem:   Closed left hip fracture (HCC) Active Problems:   Pulmonary embolism (HCC)   HTN (hypertension)   Diabetes mellitus without complication (HCC)   Dementia (HCC)   Depression with anxiety   Fracture of lumbar spine (HCC)   Fracture of thoracic spine (HCC)  Estimated body mass index is 18.81 kg/m as calculated from the following:   Height as of this encounter: 4\' 10"  (1.473 m).   Weight as of this encounter: 40.8 kg. Advance diet Up with therapy Pain well controlled Labs and vital signs are stable Care management to assist with discharge  Follow-up with [facility] orthopedics in 2 weeks Remove Praveena on 03/27/2021 and apply honeycomb dressing TED hose bilateral lower extremity x6 weeks Lovenox 40 mg subcu daily x14 days discharge  DVT Prophylaxis - Lovenox, TED hose, and SCDs Weight-Bearing as tolerated to left leg   T. Rachelle Hora, PA-C [place] 03/19/2021, 8:07 AM

## 2021-03-19 NOTE — Assessment & Plan Note (Signed)
-   continue lisinopril

## 2021-03-19 NOTE — Assessment & Plan Note (Signed)
-   nonop management per neurosurgery - continue TLSO brace when OOB - continue pain control

## 2021-03-19 NOTE — Progress Notes (Signed)
Physical Therapy Treatment Patient Details Name: Kimberly Meyer MRN: [id number] DOB: 1946/01/17 Today's Date: 03/19/2021   History of Present Illness 75 y.o. female with medical history significant of hypertension, hyperlipidemia, diabetes mellitus, GERD, depression, anxiety, kidney stone, IBS, remote PE not on anticoagulants, ambulatory carcinoma, colitis, dementia, who presents with fall, left hip pain and back pain. Imaging reveals left displaced femoral neck fracture (s/p hemiarthroplasty 11/22) and L1 compression fracture (to be treated non-surgically).    PT Comments    Pt was long sitting in bed upon arriving. She is A but overall disoriented to full scope of situation. She does follow simple one step commands with increased time however also required constant encouragement to fully participate. Unaware of hip precautions or spinal precautions. She reports 6/10 pain but unable to state the locations of the pain. Session progressed to sitting EOB with via log roll technique. Author adjusted brace to proper fit prior to standing EOB 3 x. Pt unwilling to ambulate away from EOB due top fatigue and pain. Author will return in morning and continue to progress pt to PLOF. Highly recommend DC to SNF to address deficits while maximizing independence with ADLs.     Recommendations for follow up therapy are one component of a multi-disciplinary discharge planning process, led by the attending physician.  Recommendations may be updated based on patient status, additional functional criteria and insurance authorization.  Follow Up Recommendations  Skilled nursing-short term rehab (<3 hours/day)     Assistance Recommended at Discharge Frequent or constant Supervision/Assistance  Equipment Recommendations  Other (comment) (defer to next level of care)       Precautions / Restrictions Precautions Precautions: Anterior Hip;Back;Fall Required Braces or Orthoses: Spinal Brace Spinal Brace:  Thoracolumbosacral orthotic;Applied in sitting position Restrictions Weight Bearing Restrictions: Yes LLE Weight Bearing: Weight bearing as tolerated     Mobility  Bed Mobility Overal bed mobility: Needs Assistance Bed Mobility: Supine to Sit;Sit to Supine;Rolling Rolling: Mod assist   Supine to sit: Mod assist Sit to supine: Mod assist        Transfers Overall transfer level: Needs assistance Equipment used: Rolling walker (2 wheels) Transfers: Sit to/from Stand Sit to Stand: Min assist;From elevated surface     Step pivot transfers: Mod assist;+2 physical assistance     General transfer comment: pt performed STS 3 x EOB however refused to ambulate away from EOB 2/2 to pin/fatigue    Ambulation/Gait      General Gait Details: pt was unwilling this afternoon     Balance Overall balance assessment: Needs assistance Sitting-balance support: Feet supported;Single extremity supported Sitting balance-Leahy Scale: Fair     Standing balance support: Bilateral upper extremity supported Standing balance-Leahy Scale: Poor          Cognition Arousal/Alertness: Awake/alert Behavior During Therapy: WFL for tasks assessed/performed Overall Cognitive Status: History of cognitive impairments - at baseline              General Comments: Pt is alert and able to follow simple one step commands however cognition deficits are present. UNaware of overall situation + disoriented to setting/time/ etc        Exercises Other Exercises Other Exercises: Pt educ re: OT role, falls prevention, DME use Other Exercises: Log roll to EOB>sit, apply brace, sit<>stand, pivot to chair        Pertinent Vitals/Pain Pain Assessment: 0-10 Pain Score: 6  Faces Pain Scale: Hurts even more Breathing: normal Negative Vocalization: none Facial Expression: smiling or inexpressive Body Language:  relaxed Consolability: no need to console PAINAD Score: 0 Pain Location: L hip, low  back Pain Descriptors / Indicators: Aching;Discomfort;Grimacing;Sore Pain Intervention(s): Limited activity within patient's tolerance;Monitored during session;Premedicated before session;Repositioned    Home Living Family/patient expects to be discharged to:: Assisted living                   Additional Comments: unsure - pt unable to answer this date    Prior Function            PT Goals (current goals can now be found in the care plan section) Progress towards PT goals: Progressing toward goals    Frequency    BID      PT Plan Current plan remains appropriate       AM-PAC PT "6 Clicks" Mobility   Outcome Measure  Help needed turning from your back to your side while in a flat bed without using bedrails?: A Little Help needed moving from lying on your back to sitting on the side of a flat bed without using bedrails?: A Little Help needed moving to and from a bed to a chair (including a wheelchair)?: A Lot Help needed standing up from a chair using your arms (e.g., wheelchair or bedside chair)?: A Little Help needed to walk in hospital room?: A Lot Help needed climbing 3-5 steps with a railing? : Total 6 Click Score: 14    End of Session Equipment Utilized During Treatment: Gait belt;Back brace Activity Tolerance: Patient tolerated treatment well Patient left: in bed;with call bell/phone within reach;with bed alarm set;with nursing/sitter in room Nurse Communication: Mobility status PT Visit Diagnosis: Unsteadiness on feet (R26.81);Other abnormalities of gait and mobility (R26.89);Repeated falls (R29.6);Muscle weakness (generalized) (M62.81);History of falling (Z91.81);Difficulty in walking, not elsewhere classified (R26.2)     Time: 0175-1025 PT Time Calculation (min) (ACUTE ONLY): 19 min  Charges:  $Therapeutic Activity: 8-22 mins                     Julaine Fusi PTA 03/19/21, 5:07 PM

## 2021-03-19 NOTE — Progress Notes (Signed)
Physical Therapy Treatment Patient Details Name: Kimberly Meyer MRN: [id number] DOB: [birth year] Today's Date: 03/19/2021   History of Present Illness 75 y.o. female with medical history significant of hypertension, hyperlipidemia, diabetes mellitus, GERD, depression, anxiety, kidney stone, IBS, remote PE not on anticoagulants, ambulatory carcinoma, colitis, dementia, who presents with fall, left hip pain and back pain. Imaging reveals left displaced femoral neck fracture (s/p hemiarthroplasty 11/22) and L1 compression fracture (to be treated non-surgically).    PT Comments    Pt in recliner stating she needs to void.  Stood to walker with min a x 1 to transfer to commode with cues and overall fair quality steps.  Voided large amount of dark foul urine - tech notified and stated RN was aware. She is able to transfer back to chair with min/mod a x 1 with poor quality steps to chair and increased assist but remains +1 assist.  Seated AROM x 10. Elected to remain in chair after session.     Recommendations for follow up therapy are one component of a multi-disciplinary discharge planning process, led by the attending physician.  Recommendations may be updated based on patient status, additional functional criteria and insurance authorization.  Follow Up Recommendations  Skilled nursing-short term rehab (<3 hours/day)     Assistance Recommended at Discharge Frequent or constant Supervision/Assistance  Equipment Recommendations       Recommendations for Other Services       Precautions / Restrictions Precautions Precautions: Anterior Hip;Back;Fall Precaution Booklet Issued: No Precaution Comments: TLSO on for all out of bed mobility. No hip precautions. Restrictions Weight Bearing Restrictions: Yes LLE Weight Bearing: Weight bearing as tolerated     Mobility  Bed Mobility               General bed mobility comments: in chair before and after session    Transfers Overall  transfer level: Needs assistance Equipment used: Rolling walker (2 wheels) Transfers: Sit to/from Stand Sit to Stand: Min assist                Ambulation/Gait Ambulation/Gait assistance: Min assist;Mod assist Gait Distance (Feet): 3 Feet Assistive device: Rolling walker (2 wheels) Gait Pattern/deviations: Step-to pattern;Decreased step length - right;Decreased step length - left;Decreased stance time - left Gait velocity: decreased     General Gait Details: few stept to chair of fair quality   Stairs             Wheelchair Mobility    Modified Rankin (Stroke Patients Only)       Balance Overall balance assessment: Needs assistance Sitting-balance support: Bilateral upper extremity supported;Feet supported Sitting balance-Leahy Scale: Fair     Standing balance support: Bilateral upper extremity supported Standing balance-Leahy Scale: Poor Standing balance comment: walker and +1 assist for balance and support                            Cognition Arousal/Alertness: Awake/alert Behavior During Therapy: WFL for tasks assessed/performed Overall Cognitive Status: History of cognitive impairments - at baseline                                 General Comments: asked x 3 during session why her leg hurt and was suprised by answer each time "Did I?"        Exercises Other Exercises Other Exercises: seated AROM x 10 BLE Other Exercises: commode to void -  urine foul and dark.  tech in and notified stated RN was aware.    General Comments        Pertinent Vitals/Pain Pain Assessment: Faces Faces Pain Scale: Hurts little more Breathing: normal Negative Vocalization: none Facial Expression: facial grimacing Body Language: relaxed Consolability: no need to console PAINAD Score: 2 Pain Location: L hip, low back Pain Descriptors / Indicators: Aching;Discomfort;Grimacing;Sore Pain Intervention(s): Limited activity within patient's  tolerance;Monitored during session;Premedicated before session;Repositioned    Home Living                          Prior Function            PT Goals (current goals can now be found in the care plan section) Progress towards PT goals: Progressing toward goals    Frequency    BID      PT Plan Current plan remains appropriate    Co-evaluation              AM-PAC PT "6 Clicks" Mobility   Outcome Measure  Help needed turning from your back to your side while in a flat bed without using bedrails?: A Little Help needed moving from lying on your back to sitting on the side of a flat bed without using bedrails?: A Little Help needed moving to and from a bed to a chair (including a wheelchair)?: A Lot Help needed standing up from a chair using your arms (e.g., wheelchair or bedside chair)?: A Little Help needed to walk in hospital room?: A Lot Help needed climbing 3-5 steps with a railing? : Total 6 Click Score: 14    End of Session Equipment Utilized During Treatment: Gait belt;Back brace Activity Tolerance: Patient tolerated treatment well Patient left: in chair;with call bell/phone within reach;with chair alarm set Nurse Communication: Mobility status PT Visit Diagnosis: Unsteadiness on feet (R26.81);Other abnormalities of gait and mobility (R26.89);Repeated falls (R29.6);Muscle weakness (generalized) (M62.81);History of falling (Z91.81);Difficulty in walking, not elsewhere classified (R26.2)     Time: 1100-1114 PT Time Calculation (min) (ACUTE ONLY): 14 min  Charges:  $Therapeutic Activity: 8-22 mins                    Chesley Noon, PTA 03/19/21, 11:24 AM

## 2021-03-19 NOTE — Assessment & Plan Note (Addendum)
-   continue diet control

## 2021-03-19 NOTE — Assessment & Plan Note (Signed)
-   continue DVT ppx as noted above

## 2021-03-19 NOTE — Plan of Care (Signed)

## 2021-03-19 NOTE — Discharge Instructions (Signed)

## 2021-03-19 NOTE — NC FL2 (Signed)
[place] LEVEL OF CARE SCREENING TOOL     IDENTIFICATION  Patient Name: Kimberly Meyer Birthdate: April 05, 1946 Sex: female Admission Date (Current Location): 03/17/2021  [facility] and [place] Number:  Engineering geologist and Address:  [facility], [address], [place], [place] [address]      Provider Number: [id number]  Attending Physician Name and Address:  Dwyane Dee, MD  Relative Name and Phone Number:  Jeneen Rinks SOn [phone]    Current Level of Care: Hospital Recommended Level of Care: [place] Prior Approval Number:    Date Approved/Denied:   PASRR Number: [id number]  Discharge Plan: SNF    Current Diagnoses: Patient Active Problem List   Diagnosis Date Noted   Closed left hip fracture ([place]) 03/17/2021   HTN (hypertension) 03/17/2021   Diabetes mellitus without complication ([place]) 36/14/4315   Dementia ([place]) 03/17/2021   Depression with anxiety 03/17/2021   Fracture of lumbar spine ([place]) 03/17/2021   Fracture of thoracic spine ([place]) 03/17/2021   Fall    NAFLD (nonalcoholic fatty liver disease) 05/24/2017   Age-related osteoporosis without current pathological fracture 03/03/2017   Breast asymmetry 11/19/2016   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[place]) 06/07/2015   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   Chronic tension-type headache, intractable 11/07/2014   Acid reflux 11/07/2014   HLD (hyperlipidemia) 11/07/2014   BP (high blood pressure) 11/07/2014   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   Calculus of kidney 11/07/2014   PE (pulmonary embolism) 11/07/2014   Functional bowel disorder 11/07/2014   History of artificial joint 03/06/2014   Acute situational disturbance 02/21/2014   Pulmonary embolism ([place]) 02/27/2009    Orientation RESPIRATION BLADDER Height & Weight     Self, Place  Normal Continent, External catheter Weight: 40.8 kg Height:  4\' 10"   (147.3 cm)  BEHAVIORAL SYMPTOMS/MOOD NEUROLOGICAL BOWEL NUTRITION STATUS      Continent Diet (regular)  AMBULATORY STATUS COMMUNICATION OF NEEDS Skin   Extensive Assist Verbally Normal, Surgical wounds                       Personal Care Assistance Level of Assistance  Bathing, Feeding, Dressing Bathing Assistance: Limited assistance Feeding assistance: Independent Dressing Assistance: Limited assistance     Functional Limitations Info             SPECIAL CARE FACTORS FREQUENCY  PT (By licensed PT)     PT Frequency: 5 times per week              Contractures Contractures Info: Not present    Additional Factors Info  Code Status, Allergies Code Status Info: DNR Allergies Info: Sulfa Antiviotics           Current Medications (03/19/2021):  This is the current hospital active medication list Current Facility-Administered Medications  Medication Dose Route Frequency Provider Last Rate Last Admin   0.9 %  sodium chloride infusion   Intravenous Continuous Hessie Knows, MD 100 mL/hr at 03/19/21 0121 New Bag at 03/19/21 0121   acetaminophen (TYLENOL) tablet 1,000 mg  1,000 mg Oral Q6H Hessie Knows, MD   1,000 mg at 03/19/21 0514   acetaminophen (TYLENOL) tablet 325-650 mg  325-650 mg Oral Q6H PRN Hessie Knows, MD       alum & mag hydroxide-simeth (MAALOX/MYLANTA) 200-200-20 MG/5ML suspension 30 mL  30 mL Oral Q4H PRN Hessie Knows, MD       bisacodyl (DULCOLAX)  suppository 10 mg  10 mg Rectal Daily PRN Hessie Knows, MD       buPROPion [facility] SR) 12 hr tablet 150 mg  150 mg Oral BID Hessie Knows, MD   150 mg at 03/19/21 1055   citalopram (CELEXA) tablet 10 mg  10 mg Oral Daily Hessie Knows, MD   10 mg at 03/19/21 1053   clonazePAM (KLONOPIN) tablet 0.5 mg  0.5 mg Oral QHS Hessie Knows, MD   0.5 mg at 03/18/21 2208   diphenhydrAMINE (BENADRYL) 12.5 MG/5ML elixir 12.5-25 mg  12.5-25 mg Oral Q4H PRN Hessie Knows, MD       docusate sodium (COLACE) capsule  100 mg  100 mg Oral BID Hessie Knows, MD   100 mg at 03/19/21 1055   donepezil (ARICEPT) tablet 10 mg  10 mg Oral QHS Hessie Knows, MD   10 mg at 03/18/21 2208   enoxaparin (LOVENOX) injection 30 mg  30 mg Subcutaneous Q24H Hessie Knows, MD   30 mg at 03/19/21 1057   hydrALAZINE (APRESOLINE) injection 5 mg  5 mg Intravenous Q2H PRN Hessie Knows, MD       HYDROmorphone (DILAUDID) injection 0.5-1 mg  0.5-1 mg Intravenous Q4H PRN Hessie Knows, MD       insulin aspart (novoLOG) injection 0-5 Units  0-5 Units Subcutaneous QHS Hessie Knows, MD   2 Units at 03/17/21 2120   insulin aspart (novoLOG) injection 0-9 Units  0-9 Units Subcutaneous TID WC Hessie Knows, MD   2 Units at 03/19/21 1055   lisinopril (ZESTRIL) tablet 10 mg  10 mg Oral Daily Hessie Knows, MD   10 mg at 03/19/21 1056   magnesium hydroxide (MILK OF MAGNESIA) suspension 30 mL  30 mL Oral QHS Hessie Knows, MD   30 mL at 03/18/21 2209   menthol-cetylpyridinium (CEPACOL) lozenge 3 mg  1 lozenge Oral PRN Hessie Knows, MD       Or   phenol (CHLORASEPTIC) mouth spray 1 spray  1 spray Mouth/Throat PRN Hessie Knows, MD       methocarbamol (ROBAXIN) tablet 500 mg  500 mg Oral Q6H PRN Hessie Knows, MD   500 mg at 03/19/21 1056   Or   methocarbamol (ROBAXIN) 500 mg in dextrose 5 % 50 mL IVPB  500 mg Intravenous Q6H PRN Hessie Knows, MD       metoCLOPramide (REGLAN) tablet 5-10 mg  5-10 mg Oral Q8H PRN Hessie Knows, MD       Or   metoCLOPramide (REGLAN) injection 5-10 mg  5-10 mg Intravenous Q8H PRN Hessie Knows, MD       mupirocin ointment (BACTROBAN) 2 % 1 application  1 application Nasal BID Hessie Knows, MD   1 application at 26/20/35 1057   ondansetron (ZOFRAN) tablet 4 mg  4 mg Oral Q6H PRN Hessie Knows, MD       Or   ondansetron [facility]) injection 4 mg  4 mg Intravenous Q6H PRN Hessie Knows, MD       oxyCODONE (Oxy IR/ROXICODONE) immediate release tablet 10-15 mg  10-15 mg Oral Q4H PRN Hessie Knows, MD   10 mg at 03/19/21  5974   oxyCODONE (Oxy IR/ROXICODONE) immediate release tablet 5-10 mg  5-10 mg Oral Q4H PRN Hessie Knows, MD       pantoprazole (PROTONIX) EC tablet 40 mg  40 mg Oral Daily Hessie Knows, MD   40 mg at 03/19/21 1056   sodium phosphate (FLEET) 7-19 GM/118ML enema 1 enema  1 enema Rectal  Once PRN Hessie Knows, MD       zolpidem [facility]) tablet 5 mg  5 mg Oral QHS PRN Hessie Knows, MD         Discharge Medications: Please see discharge summary for a list of discharge medications.  Relevant Imaging Results:  Relevant Lab Results:   Additional Information SS# [id number]  Conception Oms, RN

## 2021-03-19 NOTE — Care Management Important Message (Signed)
Important Message  Patient Details  Name: Kimberly Meyer MRN: [id number] Date of Birth: 07/09/1945   Medicare Important Message Given:  Yes     Juliann Pulse A Lesleyann Fichter 03/19/2021, 3:29 PM

## 2021-03-19 NOTE — Evaluation (Signed)
Occupational Therapy Evaluation Patient Details Name: Kimberly Meyer MRN: [id number] DOB: 1945-05-02 Today's Date: 03/19/2021   History of Present Illness 75 y.o. female with medical history significant of hypertension, hyperlipidemia, diabetes mellitus, GERD, depression, anxiety, kidney stone, IBS, remote PE not on anticoagulants, ambulatory carcinoma, colitis, dementia, who presents with fall, left hip pain and back pain. Imaging reveals left displaced femoral neck fracture (s/p hemiarthroplasty 11/22) and L1 compression fracture (to be treated non-surgically).   Clinical Impression   Ms. Bale was seen for OT evaluation this date. Prior to hospital admission, per chart review pt was living in an assisted living facility. Per pt report,  no assistive devices used. Currently pt demonstrates impairments as described below (See OT problem list) which functionally limit her ability to perform ADL/self-care tasks. Pt currently requires MOD A for donning TLSO seated EOB, anticipate MOD A for UB dressing. SETUP for self-feeding, sitting in chair. Pt requires MIN A + RW sit>stand decreasing to MOD A x2 + RW for step pivot from bed to chair.  Pt reports being unaware of surgery, is unable to sequence tasks, and is only able to follow 1-step commands. Pt would benefit from skilled OT services to address noted impairments and functional limitations (see below for any additional details) in order to maximize safety and independence while minimizing falls risk and caregiver burden. Upon hospital discharge, recommend STR to maximize pt safety and return to PLOF.       Recommendations for follow up therapy are one component of a multi-disciplinary discharge planning process, led by the attending physician.  Recommendations may be updated based on patient status, additional functional criteria and insurance authorization.   Follow Up Recommendations  Skilled nursing-short term rehab (<3 hours/day)    Assistance  Recommended at Discharge Frequent or constant Supervision/Assistance  Functional Status Assessment  Patient has had a recent decline in their functional status and demonstrates the ability to make significant improvements in function in a reasonable and predictable amount of time.  Equipment Recommendations       Recommendations for Other Services       Precautions / Restrictions Precautions Precautions: Anterior Hip;Back;Fall Precaution Booklet Issued: No Precaution Comments: TLSO on for all out of bed mobility. No hip precautions. Required Braces or Orthoses: Spinal Brace Spinal Brace: Thoracolumbosacral orthotic;Applied in sitting position Restrictions Weight Bearing Restrictions: Yes LLE Weight Bearing: Weight bearing as tolerated      Mobility Bed Mobility Overal bed mobility: Needs Assistance Bed Mobility: Supine to Sit;Sit to Supine;Rolling Rolling: Mod assist   Supine to sit: Min assist;HOB elevated     General bed mobility comments: in chair before and after session    Transfers Overall transfer level: Needs assistance Equipment used: Rolling walker (2 wheels) Transfers: Sit to/from Stand;Bed to chair/wheelchair/BSC Sit to Stand: Min assist     Step pivot transfers: Mod assist;+2 physical assistance            Balance Overall balance assessment: Needs assistance Sitting-balance support: Feet supported;Single extremity supported Sitting balance-Leahy Scale: Fair     Standing balance support: Bilateral upper extremity supported Standing balance-Leahy Scale: Poor Standing balance comment: walker and +1 assist for balance and support                           ADL either performed or assessed with clinical judgement   ADL Overall ADL's : Needs assistance/impaired  General ADL Comments: SETUP self-feeding, sitting in chair. Pt MOD A x2 + RW for step pivot from bed to chair. Pt MOD A for  don TLSO, anticipate MOD A for UB dressing.      Pertinent Vitals/Pain Pain Assessment: Faces Faces Pain Scale: Hurts little more Breathing: normal Negative Vocalization: none Facial Expression: facial grimacing Body Language: relaxed Consolability: no need to console PAINAD Score: 2 Pain Location: L hip, low back Pain Descriptors / Indicators: Aching;Discomfort;Grimacing;Sore Pain Intervention(s): Limited activity within patient's tolerance;Monitored during session;Repositioned;RN gave pain meds during session     Hand Dominance     Extremity/Trunk Assessment Upper Extremity Assessment Upper Extremity Assessment: Overall WFL for tasks assessed   Lower Extremity Assessment Lower Extremity Assessment: LLE deficits/detail LLE Deficits / Details: s/p L hemiarthroplasty       Communication Communication Communication: No difficulties   Cognition Arousal/Alertness: Awake/alert Behavior During Therapy: WFL for tasks assessed/performed Overall Cognitive Status: History of cognitive impairments - at baseline                                 General Comments: Unaware of surgery, unable to remember precautions     General Comments       Exercises Exercises: Other exercises Other Exercises Other Exercises: Pt educ re: OT role, falls prevention, DME use Other Exercises: Log roll to EOB>sit, apply brace, sit<>stand, pivot to chair   Shoulder Instructions      Home Living Family/patient expects to be discharged to:: Assisted living                                 Additional Comments: unsure - pt unable to answer this date      Prior Functioning/Environment Prior Level of Function : Patient poor historian/Family not available;History of Falls (last six months)             Mobility Comments: Per chart, pt w/ recent falls. Pt reports no AD use          OT Problem List: Decreased strength;Decreased activity tolerance;Impaired balance  (sitting and/or standing);Decreased coordination;Decreased cognition;Decreased safety awareness;Decreased knowledge of use of DME or AE;Decreased knowledge of precautions      OT Treatment/Interventions: Self-care/ADL training;Therapeutic exercise;Neuromuscular education;DME and/or AE instruction;Therapeutic activities    OT Goals(Current goals can be found in the care plan section) Acute Rehab OT Goals Patient Stated Goal: to get better OT Goal Formulation: With patient Time For Goal Achievement: 04/02/21 Potential to Achieve Goals: Good ADL Goals Pt Will Perform Grooming: with min guard assist;standing (w/ min cues) Pt Will Perform Lower Body Dressing: with mod assist;sit to/from stand (w/ min cues) Pt Will Transfer to Toilet: with mod assist;ambulating;bedside commode (w/ min cues)  OT Frequency: Min 2X/week   Barriers to D/C: Decreased caregiver support          Co-evaluation              AM-PAC OT "6 Clicks" Daily Activity     Outcome Measure Help from another person eating meals?: None Help from another person taking care of personal grooming?: A Little Help from another person toileting, which includes using toliet, bedpan, or urinal?: A Lot Help from another person bathing (including washing, rinsing, drying)?: A Lot Help from another person to put on and taking off regular upper body clothing?: A Little Help from another person to put on  and taking off regular lower body clothing?: A Lot 6 Click Score: 16   End of Session Equipment Utilized During Treatment: Gait belt;Rolling walker (2 wheels)  Activity Tolerance: Patient tolerated treatment well Patient left: in chair;with call bell/phone within reach;with chair alarm set  OT Visit Diagnosis: Unsteadiness on feet (R26.81);Other abnormalities of gait and mobility (R26.89);Muscle weakness (generalized) (M62.81)                Time: 3668-1594 OT Time Calculation (min): 35 min Charges:  OT General Charges $OT  Visit: 1 Visit OT Evaluation $OT Eval Moderate Complexity: 1 Mod OT Treatments $Self Care/Home Management : 23-37 mins  Nino Glow, Markus Daft 03/19/2021, 3:13 PM

## 2021-03-19 NOTE — Assessment & Plan Note (Signed)
-   continue aricept

## 2021-03-20 LAB — GLUCOSE, CAPILLARY
Glucose-Capillary: 172 mg/dL — ABNORMAL HIGH (ref 70–99)
Glucose-Capillary: 258 mg/dL — ABNORMAL HIGH (ref 70–99)
Glucose-Capillary: 82 mg/dL (ref 70–99)
Glucose-Capillary: 180 mg/dL — ABNORMAL HIGH (ref 70–99)

## 2021-03-20 NOTE — Progress Notes (Signed)
   Subjective: 2 Days Post-Op Procedure(s) (LRB): ARTHROPLASTY BIPOLAR HIP (HEMIARTHROPLASTY) (Left) Patient reports pain as moderate this morning..   Patient is overall well but does report moderate pain in the hip after therapy. Denies any CP, SOB, ABD pain. We will continue therapy today.  Plan is to go Home after hospital stay. Patient is passing gas, has not had a BM yet.  Objective: Vital signs in last 24 hours: Temp:  [97.6 F (36.4 C)-97.9 F (36.6 C)] 97.6 F (36.4 C) (11/24 0817) Pulse Rate:  [78-90] 90 (11/24 0817) Resp:  [14-17] 14 (11/24 0817) BP: (90-115)/(51-86) 115/63 (11/24 0817) SpO2:  [91 %-99 %] 91 % (11/24 0817)  Intake/Output from previous day: 11/23 0701 - 11/24 0700 In: 1154.6 [P.O.:360; I.V.:794.6] Out: -  Intake/Output this shift: No intake/output data recorded.  Recent Labs    03/17/21 1046 03/18/21 0336 03/19/21 0404  HGB 15.3* 13.9 11.7*   Recent Labs    03/18/21 0336 03/19/21 0404  WBC 7.9 8.8  RBC 4.60 3.87  HCT 41.9 34.9*  PLT 233 198   Recent Labs    03/17/21 1046 03/18/21 0336 03/19/21 0404  NA 136  --  133*  K 4.7  --  4.1  CL 98  --  100  CO2 29  --  27  BUN 24*  --  27*  CREATININE 0.95 0.95 0.84  GLUCOSE 209*  --  155*  CALCIUM 9.0  --  8.0*   Recent Labs    03/17/21 1046  INR 1.1    EXAM General - Patient is Alert, Appropriate, and Oriented Extremity - Neurovascular intact Sensation intact distally Intact pulses distally Dorsiflexion/Plantar flexion intact Dressing - dressing C/D/I and no drainage, Praveena intact without drainage Motor Function - intact, moving foot and toes well on exam.  Abdomen soft with hyperactive bowel sounds.  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[place])    Anemia    Anxiety    Chronic tension headache    Depression    GERD (gastroesophageal reflux disease)    History of pulmonary embolus (PE)    Hyperlipemia    Hypertension    IBS (irritable bowel  syndrome)    Kidney stone    Low back pain    Situational stress     Assessment/Plan:   2 Days Post-Op Procedure(s) (LRB): ARTHROPLASTY BIPOLAR HIP (HEMIARTHROPLASTY) (Left) Principal Problem:   Closed left hip fracture (HCC) Active Problems:   History of pulmonary embolism   HTN (hypertension)   Diabetes mellitus without complication (HCC)   Dementia ([place])   Depression with anxiety   Fracture of lumbar spine (HCC)   Fracture of thoracic spine (HCC)  Estimated body mass index is 18.81 kg/m as calculated from the following:   Height as of this encounter: 4\' 10"  (1.473 m).   Weight as of this encounter: 40.8 kg. Advance diet Up with therapy  Labs and vital signs are stable Care management to assist with discharge, current plan is for discharge to SNF.  Follow-up with [facility] orthopedics in 2 weeks Remove Praveena on 03/27/2021 and apply honeycomb dressing TED hose bilateral lower extremity x6 weeks Lovenox 40 mg subcu daily x14 days discharge  DVT Prophylaxis - Lovenox, TED hose, and SCDs Weight-Bearing as tolerated to left leg  J. Cameron Proud, PA-C [place] 03/20/2021, 8:55 AM

## 2021-03-20 NOTE — Progress Notes (Signed)
Physical Therapy Treatment Patient Details Name: Kimberly Meyer MRN: [id number] DOB: 11-27-1945 Today's Date: 03/20/2021   History of Present Illness 75 y.o. female with medical history significant of hypertension, hyperlipidemia, diabetes mellitus, GERD, depression, anxiety, kidney stone, IBS, remote PE not on anticoagulants, ambulatory carcinoma, colitis, dementia, who presents with fall, left hip pain and back pain. Imaging reveals left displaced femoral neck fracture (s/p hemiarthroplasty 11/22) and L1 compression fracture (to be treated non-surgically).    PT Comments    Pt was long sitting in bed awake however disoriented. Pleasantly confused throughout session. Was cooperative and able to follow simple one step commands consistently. She was able to log roll L to short sit. Applied brace EOB prior to standing to RW. Pt ambulated very short distance with poor gait sequencing and safety. Recommend RN staff just stand pivot  pt back to bed once pt is ready. Pt overall tolerated session ok. Was limited by pain and fatigue. Highly recommend DC to SNF to address deficits while maximizing independence with ADLs.    Recommendations for follow up therapy are one component of a multi-disciplinary discharge planning process, led by the attending physician.  Recommendations may be updated based on patient status, additional functional criteria and insurance authorization.  Follow Up Recommendations  Skilled nursing-short term rehab (<3 hours/day)     Assistance Recommended at Discharge Frequent or constant Supervision/Assistance  Equipment Recommendations  Other (comment) (defer to next level of care)       Precautions / Restrictions Precautions Precautions: Anterior Hip;Back;Fall Precaution Booklet Issued: Yes (comment) Precaution Comments: TLSO on for all out of bed mobility. No hip precautions. Required Braces or Orthoses: Spinal Brace Spinal Brace: Thoracolumbosacral orthotic;Applied in  sitting position Restrictions Weight Bearing Restrictions: Yes LLE Weight Bearing: Weight bearing as tolerated     Mobility  Bed Mobility Overal bed mobility: Needs Assistance Bed Mobility: Supine to Sit;Sit to Supine;Rolling Rolling: Min assist   Supine to sit: Mod assist     General bed mobility comments: mod assist top perform log roll L to short sit with increased time + mod assist. sat EOB x several minutes prior to standing. TLSO placed while pt sitting EOB    Transfers Overall transfer level: Needs assistance Equipment used: Rolling walker (2 wheels) Transfers: Sit to/from Stand Sit to Stand: Min assist;Mod assist           General transfer comment: min-mod assist of one to stand from slightly elevated bed height. vcs for technique and safety throughout.    Ambulation/Gait Ambulation/Gait assistance: Min assist;Mod assist Gait Distance (Feet): 6 Feet Assistive device: Rolling walker (2 wheels) Gait Pattern/deviations: Step-to pattern;Narrow base of support;Shuffle Gait velocity: decreased     General Gait Details: pt has poor overall gait kinematics with constant vcs for imporved safety and sequencing. pt is limited by pain and fatigue    Balance Overall balance assessment: Needs assistance Sitting-balance support: Feet supported Sitting balance-Leahy Scale: Good     Standing balance support: Bilateral upper extremity supported Standing balance-Leahy Scale: Poor Standing balance comment: extremely high fall risk       Cognition Arousal/Alertness: Awake/alert Behavior During Therapy: WFL for tasks assessed/performed Overall Cognitive Status: History of cognitive impairments - at baseline      General Comments: pt is alert and able to follow simple commands however pt is disoriented with cognition deficits present. Remained cooperative and pleasant throughout. Poor awareness of deficits               Pertinent  Vitals/Pain Pain Assessment:  0-10 Pain Score: 4  Faces Pain Scale: Hurts a little bit Pain Intervention(s): Limited activity within patient's tolerance;Monitored during session;Premedicated before session;Repositioned     PT Goals (current goals can now be found in the care plan section) Acute Rehab PT Goals Patient Stated Goal: none stated Progress towards PT goals: Progressing toward goals    Frequency    BID      PT Plan Current plan remains appropriate       AM-PAC PT "6 Clicks" Mobility   Outcome Measure  Help needed turning from your back to your side while in a flat bed without using bedrails?: A Little Help needed moving from lying on your back to sitting on the side of a flat bed without using bedrails?: A Lot Help needed moving to and from a bed to a chair (including a wheelchair)?: A Lot Help needed standing up from a chair using your arms (e.g., wheelchair or bedside chair)?: A Little Help needed to walk in hospital room?: A Lot Help needed climbing 3-5 steps with a railing? : Total 6 Click Score: 13    End of Session Equipment Utilized During Treatment: Gait belt;Back brace Activity Tolerance: Patient tolerated treatment well Patient left: in chair;with call bell/phone within reach;with chair alarm set Nurse Communication: Mobility status PT Visit Diagnosis: Unsteadiness on feet (R26.81);Other abnormalities of gait and mobility (R26.89);Repeated falls (R29.6);Muscle weakness (generalized) (M62.81);History of falling (Z91.81);Difficulty in walking, not elsewhere classified (R26.2)     Time: 8889-1694 PT Time Calculation (min) (ACUTE ONLY): 28 min  Charges:  $Gait Training: 8-22 mins $Therapeutic Activity: 8-22 mins                     Julaine Fusi PTA 03/20/21, 12:42 PM

## 2021-03-20 NOTE — Progress Notes (Signed)
Progress Note    Kimberly Meyer   YDX:[id number]  DOB: Feb 10, 1946  DOA: 03/17/2021     3 PCP: Marinda Elk, MD  Initial CC: falls  Hospital Course: 75 y.o. female with medical history significant of hypertension, hyperlipidemia, diabetes mellitus, GERD, depression, anxiety, kidney stone, IBS, remote PE not on anticoagulants, ambulatory carcinoma, colitis, dementia, who presented with fall, left hip pain and back pain.   Per report, patient has had multiple falls over the past 2 weeks.  This is likely due to increased weakness.   Her most recent fall prior to admission resulted in lower back and left hip pain. She underwent multiple imaging studies on admission which showed an acute L1 burst fracture and an acute moderately displaced left femoral neck fracture.  She was evaluated by orthopedic surgery and underwent left hip hemiarthroplasty on 03/18/2021. She was also evaluated by neurosurgery regarding her L1 burst fracture and was recommended for nonoperative management and to wear TLSO brace when out of bed.  Interval History:  No events overnight. Up in chair this am and eating breakfast.   Assessment & Plan: * Closed left hip fracture [facility]) - s/p left him hemiarthroplasty on 11/22 - continue with PT; will need SNF at discharge; family has chosen [facility]; will plan for d/c on 11/25 - per ortho:   Follow-up with [facility] orthopedics in 2 weeks  Remove Praveena on 03/27/2021 and apply honeycomb dressing  TED hose bilateral lower extremity x6 weeks  Lovenox 40 mg subcu daily x14 days discharge  Fracture of lumbar spine (HCC) - nonop management per neurosurgery - continue TLSO brace when OOB - continue pain control   Fracture of thoracic spine (HCC) - per CT T-spine: Mild superior endplate compression deformities at T2, T3 and T4, likely acute. Each of these fractures is associated with approximately 20% loss of vertebral body height. 2. Superior endplate compression deformity at  T9 does not appear acute. 3. Previous spinal augmentation for fractures at T11 and T12. Known burst fracture at L1, described on earlier lumbar spine CT. 4. Mild multilevel spondylosis with small disc protrusions. No high-grade spinal stenosis." - see L-spine fx - continue TLSO brace when OOB  Dementia ([place]) - continue aricept   History of pulmonary embolism - continue DVT ppx as noted above   Diabetes mellitus without complication ([place]) - continue SSI and CBG monitoring   Depression with anxiety - continue celexa   HTN (hypertension) - continue lisinopril     Old records reviewed in assessment of this patient  Antimicrobials:   DVT prophylaxis: Lovenox  Code Status:   Code Status: DNR  Disposition Plan:  Plan for discharge to [facility] on 11/25 Status is: Inpatient   Objective: Blood pressure 115/63, pulse 90, temperature 97.6 F (36.4 C), resp. rate 14, height 4\' 10"  (1.473 m), weight 40.8 kg, SpO2 91 %.  Examination:  Physical Exam Constitutional:      Appearance: She is not ill-appearing.  HENT:     Head: Normocephalic and atraumatic.     Mouth/Throat:     Mouth: Mucous membranes are moist.  Eyes:     Extraocular Movements: Extraocular movements intact.  Cardiovascular:     Rate and Rhythm: Normal rate and regular rhythm.  Pulmonary:     Effort: Pulmonary effort is normal.     Breath sounds: Normal breath sounds.  Abdominal:     General: Bowel sounds are normal. There is no distension.     Palpations: Abdomen is soft.  Tenderness: There is no abdominal tenderness.  Musculoskeletal:     Cervical back: Normal range of motion and neck supple.     Comments: Limited ROM in LLE due to pain  Skin:    General: Skin is warm and dry.  Neurological:     Mental Status: She is alert.     Comments: Underlying dementia appreciated; follows commands  Psychiatric:        Behavior: Behavior normal.     Consultants:  Ortho Neurosurgery  Procedures:  Left hip  hemiarthroplasty, 03/18/2021  Data Reviewed: I have personally reviewed labs and imaging studies     LOS: 3 days  Time spent: Greater than 50% of the 35 minute visit was spent in counseling/coordination of care for the patient as laid out in the A&P.   Dwyane Dee, MD [facility] 03/20/2021, 12:01 PM

## 2021-03-20 NOTE — TOC Progression Note (Addendum)
Transition of Care (TOC) - Progression Note    Patient Details  Name: Terah D Ranum MRN: [id number] Date of Birth: 08/08/1945  Transition of Care (TOC) CM/SW Contact   J [place], RN Phone Number: 03/20/2021, 11:03 AM  Clinical Narrative:   Met with the patient in the room and called her son Byron on the phone, We reviewed the Bed options and Medicare Star review, They chose [facility], I notified [facility] at [facility] and accepted in the hub. Anticipate DC tomorrow, Uploaded clinical to [employer] to obtain Ins approval, insurance is approved 11/25-11/29 ref number [id number]         Expected Discharge Plan and Services                                                 Social Determinants of Health (SDOH) Interventions    Readmission Risk Interventions No flowsheet data found.  

## 2021-03-21 DIAGNOSIS — Z86711 Personal history of pulmonary embolism: Secondary | ICD-10-CM

## 2021-03-21 LAB — SURGICAL PATHOLOGY

## 2021-03-21 LAB — GLUCOSE, CAPILLARY
Glucose-Capillary: 173 mg/dL — ABNORMAL HIGH (ref 70–99)
Glucose-Capillary: 177 mg/dL — ABNORMAL HIGH (ref 70–99)

## 2021-03-21 LAB — SARS CORONAVIRUS 2 (TAT 6-24 HRS): SARS Coronavirus 2: NEGATIVE

## 2021-03-21 MED ORDER — LACTULOSE 10 GM/15ML PO SOLN
20.0000 g | Freq: Every day | ORAL | Status: DC | PRN
  Filled 2021-03-21: qty 30

## 2021-03-21 MED ORDER — LACTULOSE 10 GM/15ML PO SOLN: 20.0000 g | Freq: Every day | ORAL | 0 refills | Status: AC | PRN

## 2021-03-21 NOTE — Progress Notes (Signed)
Subjective: 3 Days Post-Op Procedure(s) (LRB): ARTHROPLASTY BIPOLAR HIP (HEMIARTHROPLASTY) (Left) Patient reports pain as moderate this morning but reports it feels better than it did yesterday. Patient is overall well, has not had a BM yet. Denies any CP, SOB, ABD pain. We will continue therapy today.  Plan is to go to SNF, possibly today.  Objective: Vital signs in last 24 hours: Temp:  [97.6 F (36.4 C)-98.4 F (36.9 C)] 98.1 F (36.7 C) (11/25 0540) Pulse Rate:  [81-97] 81 (11/25 0540) Resp:  [14-16] 16 (11/25 0540) BP: (101-115)/(60-69) 107/69 (11/25 0540) SpO2:  [91 %-95 %] 95 % (11/25 0106)  Intake/Output from previous day: 11/24 0701 - 11/25 0700 In: 240 [P.O.:240] Out: 500 [Urine:500] Intake/Output this shift: No intake/output data recorded.  Recent Labs    03/19/21 0404  HGB 11.7*   Recent Labs    03/19/21 0404  WBC 8.8  RBC 3.87  HCT 34.9*  PLT 198   Recent Labs    03/19/21 0404  NA 133*  K 4.1  CL 100  CO2 27  BUN 27*  CREATININE 0.84  GLUCOSE 155*  CALCIUM 8.0*   No results for input(s): LABPT, INR in the last 72 hours.   EXAM General - Patient is Alert, Appropriate, and Oriented Extremity - Neurovascular intact Sensation intact distally Intact pulses distally Dorsiflexion/Plantar flexion intact Dressing - dressing C/D/I and no drainage, Praveena intact without drainage Motor Function - intact, moving foot and toes well on exam.  Abdomen soft with hyperactive bowel sounds.  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[place])    Anemia    Anxiety    Chronic tension headache    Depression    GERD (gastroesophageal reflux disease)    History of pulmonary embolus (PE)    Hyperlipemia    Hypertension    IBS (irritable bowel syndrome)    Kidney stone    Low back pain    Situational stress     Assessment/Plan:   3 Days Post-Op Procedure(s) (LRB): ARTHROPLASTY BIPOLAR HIP (HEMIARTHROPLASTY) (Left) Principal  Problem:   Closed left hip fracture (HCC) Active Problems:   History of pulmonary embolism   HTN (hypertension)   Diabetes mellitus without complication (HCC)   Dementia ([place])   Depression with anxiety   Fracture of lumbar spine (HCC)   Fracture of thoracic spine (HCC)  Estimated body mass index is 18.81 kg/m as calculated from the following:   Height as of this encounter: 4\' 10"  (1.473 m).   Weight as of this encounter: 40.8 kg. Advance diet Up with therapy  Labs and vital signs are stable Care management to assist with discharge, current plan is for discharge to SNF. Patient has not yet had a BM.  Will add lactulose to regimen today.  Move to FLEET enema today if needed.  Follow-up with [facility] orthopedics in 2 weeks Remove Praveena on 03/27/2021 and apply honeycomb dressing TED hose bilateral lower extremity x6 weeks Lovenox 40 mg subcu daily x14 days discharge  DVT Prophylaxis - Lovenox, TED hose, and SCDs Weight-Bearing as tolerated to left leg  J. Cameron Proud, PA-C [facility] 03/21/2021, 7:55 AM

## 2021-03-21 NOTE — Discharge Summary (Signed)
Physician Discharge Summary   Patient name: Kimberly Meyer  Admit date:     03/17/2021  Discharge date: 03/21/2021  Discharge Physician: Dwyane Dee   PCP: Marinda Elk, MD   Recommendations at discharge:  Follow up with ortho See wound care instructions below   Discharge Diagnoses Principal Problem:   Closed left hip fracture ([place]) Active Problems:   Fracture of lumbar spine ([place])   Dementia ([place])   Fracture of thoracic spine ([place])   History of pulmonary embolism   Diabetes mellitus without complication ([place])   HTN (hypertension)   Depression with anxiety   Resolved Diagnoses Resolved Problems:   * No resolved hospital problems. [facility] Course   75 y.o. female with medical history significant of hypertension, hyperlipidemia, diabetes mellitus, GERD, depression, anxiety, kidney stone, IBS, remote PE not on anticoagulants, ambulatory carcinoma, colitis, dementia, who presented with fall, left hip pain and back pain.   Per report, patient has had multiple falls over the past 2 weeks.  This is likely due to increased weakness.   Her most recent fall prior to admission resulted in lower back and left hip pain. She underwent multiple imaging studies on admission which showed an acute L1 burst fracture and an acute moderately displaced left femoral neck fracture.  She was evaluated by orthopedic surgery and underwent left hip hemiarthroplasty on 03/18/2021. She was also evaluated by neurosurgery regarding her L1 burst fracture and was recommended for nonoperative management and to wear TLSO brace when out of bed. See below for further A&P.   * Closed left hip fracture ([place]) - s/p left him hemiarthroplasty on 11/22 - per ortho:   Follow-up with [facility] orthopedics in 2 weeks  Remove Praveena on 03/27/2021 and apply honeycomb dressing  TED hose bilateral lower extremity x6 weeks  Lovenox 40 mg subcu daily x14 days discharge  Fracture of lumbar spine (HCC) -  nonop management per neurosurgery - continue TLSO brace when OOB - continue pain control   Fracture of thoracic spine (HCC) - per CT T-spine: Mild superior endplate compression deformities at T2, T3 and T4, likely acute. Each of these fractures is associated with approximately 20% loss of vertebral body height. 2. Superior endplate compression deformity at T9 does not appear acute. 3. Previous spinal augmentation for fractures at T11 and T12. Known burst fracture at L1, described on earlier lumbar spine CT. 4. Mild multilevel spondylosis with small disc protrusions. No high-grade spinal stenosis." - see L-spine fx - continue TLSO brace when OOB  Dementia ([place]) - continue aricept   History of pulmonary embolism - continue DVT ppx as noted above   Diabetes mellitus without complication ([place]) - continue diet control   Depression with anxiety - continue celexa   HTN (hypertension) - continue lisinopril       Procedures performed: Left hip hemiarthroplasty, 03/18/2021   Condition at discharge: stable  Exam Physical Exam Constitutional:      Appearance: She is not ill-appearing.  HENT:     Head: Normocephalic and atraumatic.     Mouth/Throat:     Mouth: Mucous membranes are moist.  Eyes:     Extraocular Movements: Extraocular movements intact.  Cardiovascular:     Rate and Rhythm: Normal rate and regular rhythm.  Pulmonary:     Effort: Pulmonary effort is normal.     Breath sounds: Normal breath sounds.  Abdominal:     General: Bowel sounds are normal. There is no distension.  Palpations: Abdomen is soft.     Tenderness: There is no abdominal tenderness.  Musculoskeletal:     Cervical back: Normal range of motion and neck supple.     Comments: Limited ROM in LLE due to pain  Skin:    General: Skin is warm and dry.  Neurological:     Mental Status: She is alert.     Comments: Underlying dementia appreciated; follows commands  Psychiatric:        Behavior:  Behavior normal.     Disposition: Skilled nursing facility  Discharge time: greater than 30 minutes.  Contact information for follow-up providers     Duanne Guess, PA-C Follow up in 2 week(s).   Specialties: Orthopedic Surgery, Emergency Medicine Contact information: [place] [address] [phone]              Contact information for after-discharge care     Destination     [facility] Preferred SNF .   Service: Skilled Nursing Contact information: [address] [place] [phone]                     Allergies as of 03/21/2021       Reactions   Sulfa Antibiotics Other (See Comments)   Does not remember reaction-from childhood        Medication List     STOP taking these medications    alendronate 70 MG tablet Commonly known as: FOSAMAX   aspirin 81 MG tablet   azelastine 0.1 % nasal spray Commonly known as: ASTELIN   AZO URINARY PAIN PO   escitalopram 10 MG tablet Commonly known as: LEXAPRO   esomeprazole 20 MG capsule Commonly known as: NEXIUM   fluticasone 50 MCG/ACT nasal spray Commonly known as: FLONASE   ibuprofen 200 MG tablet Commonly known as: ADVIL   levocetirizine 5 MG tablet Commonly known as: XYZAL   lidocaine 5 % Commonly known as: Lidoderm   Multi-Vitamins Tabs   niacin 500 MG tablet   Pancrelipase (Lip-Prot-Amyl) 20000-68000 units Cpep   psyllium 58.6 % packet Commonly known as: METAMUCIL   tamsulosin 0.4 MG Caps capsule Commonly known as: FLOMAX   valACYclovir 1000 MG tablet Commonly known as: VALTREX   vitamin A 25000 UNIT capsule   vitamin B-12 1000 MCG tablet Commonly known as: CYANOCOBALAMIN   vitamin C 1000 MG tablet       TAKE these medications    buPROPion 150 MG 12 hr tablet Commonly known as: WELLBUTRIN SR Take 150 mg by mouth 2 (two) times daily.   citalopram 10 MG tablet Commonly known as:  CELEXA Take 10 mg by mouth daily.   clonazePAM 0.5 MG tablet Commonly known as: KLONOPIN Take 0.5 mg by mouth at bedtime.   docusate sodium 100 MG capsule Commonly known as: COLACE Take 1 capsule (100 mg total) by mouth 2 (two) times daily. What changed:  how much to take how to take this when to take this additional instructions   donepezil 10 MG tablet Commonly known as: ARICEPT Take 10 mg by mouth at bedtime.   enoxaparin 40 MG/0.4ML injection Commonly known as: LOVENOX Inject 0.4 mLs (40 mg total) into the skin daily for 14 days.   lactulose 10 GM/15ML solution Commonly known as: CHRONULAC Take 30 mLs (20 g total) by mouth daily as needed for mild constipation.   lisinopril 10 MG tablet Commonly known as: ZESTRIL Take 10 mg by mouth  daily.   oxyCODONE 5 MG immediate release tablet Commonly known as: Oxy IR/ROXICODONE Take 1-2 tablets (5-10 mg total) by mouth every 4 (four) hours as needed for moderate pain (pain score 4-6).   pantoprazole 40 MG tablet Commonly known as: PROTONIX Take 40 mg by mouth daily.   Potassium Citrate 15 MEQ (1620 MG) Tbcr Take 1 tablet by mouth 2 (two) times daily.               Discharge Care Instructions  (From admission, onward)           Start     Ordered   03/21/21 0000  Discharge wound care:       Comments: Follow-up with [facility] orthopedics in 2 weeks  Remove Praveena on 03/27/2021 and apply honeycomb dressing  TED hose bilateral lower extremity x6 weeks  Lovenox 40 mg subcu daily x14 days discharge   03/21/21 1118            CT HEAD WO CONTRAST (5MM)  Result Date: 03/17/2021 CLINICAL DATA:  Head trauma, mod-severe; Head trauma, minor (Age >= 65y) EXAM: CT HEAD WITHOUT CONTRAST CT CERVICAL SPINE WITHOUT CONTRAST TECHNIQUE: Multidetector CT imaging of the head and cervical spine was performed following the standard protocol without intravenous contrast. Multiplanar CT image reconstructions of the cervical spine  were also generated. COMPARISON:  None. FINDINGS: CT HEAD FINDINGS Brain: No evidence of acute infarction, hemorrhage, hydrocephalus, extra-axial collection or mass lesion/mass effect. Moderate patchy white matter hypodensities, nonspecific but compatible with chronic microvascular ischemic disease. Moderate atrophy with ex vacuo ventricular dilation. Vascular: No hyperdense vessel identified. Calcific intracranial atherosclerosis. Skull: No acute fracture. Sinuses/Orbits: Clear sinuses.  Unremarkable orbits. Other: No mastoid effusions. CT CERVICAL SPINE FINDINGS Alignment: Mild retrolisthesis of C5 on C6, likely degenerative in etiology given severe degenerative changes at this level. Mild anterolisthesis of C6 on C7 and C7 on T1, also likely degenerative given marked facet arthropathy at these levels. Skull base and vertebrae: Approximately 20% height loss of the T2 vertebral body with associated superior endplate trabecular sclerosis, suggestive of acute compression fracture. Probable lucency through the anterior/superior endplate. Partial osseous fusion of the posterior left C1 arch and skull base. Corticated defect in the posterior C1 arch, likely congenital or remote. Soft tissues and spinal canal: No prevertebral fluid or swelling. No visible canal hematoma. Disc levels: Severe degenerative disease at C5-C6. Multilevel facet arthropathy. Upper chest: Biapical pleuroparenchymal scarring. Otherwise, visualized lung apices are clear. IMPRESSION: CT cervical spine: 1. Partially imaged T2 compression fracture, likely acute. Associated 20% height loss. Recommend dedicated thoracic spine CT for further evaluation. 2. Severe multilevel degenerative change, detailed above. 3. Partial left posterior atlanto-occipital assimilation. CT head: 1. No evidence of acute intracranial abnormality. 2. Moderate chronic microvascular ischemic disease and atrophy. Electronically Signed   By: Margaretha Sheffield M.D.   On: 03/17/2021  13:15   CT Cervical Spine Wo Contrast  Result Date: 03/17/2021 CLINICAL DATA:  Head trauma, mod-severe; Head trauma, minor (Age >= 65y) EXAM: CT HEAD WITHOUT CONTRAST CT CERVICAL SPINE WITHOUT CONTRAST TECHNIQUE: Multidetector CT imaging of the head and cervical spine was performed following the standard protocol without intravenous contrast. Multiplanar CT image reconstructions of the cervical spine were also generated. COMPARISON:  None. FINDINGS: CT HEAD FINDINGS Brain: No evidence of acute infarction, hemorrhage, hydrocephalus, extra-axial collection or mass lesion/mass effect. Moderate patchy white matter hypodensities, nonspecific but compatible with chronic microvascular ischemic disease. Moderate atrophy with ex vacuo ventricular dilation. Vascular: No hyperdense vessel  identified. Calcific intracranial atherosclerosis. Skull: No acute fracture. Sinuses/Orbits: Clear sinuses.  Unremarkable orbits. Other: No mastoid effusions. CT CERVICAL SPINE FINDINGS Alignment: Mild retrolisthesis of C5 on C6, likely degenerative in etiology given severe degenerative changes at this level. Mild anterolisthesis of C6 on C7 and C7 on T1, also likely degenerative given marked facet arthropathy at these levels. Skull base and vertebrae: Approximately 20% height loss of the T2 vertebral body with associated superior endplate trabecular sclerosis, suggestive of acute compression fracture. Probable lucency through the anterior/superior endplate. Partial osseous fusion of the posterior left C1 arch and skull base. Corticated defect in the posterior C1 arch, likely congenital or remote. Soft tissues and spinal canal: No prevertebral fluid or swelling. No visible canal hematoma. Disc levels: Severe degenerative disease at C5-C6. Multilevel facet arthropathy. Upper chest: Biapical pleuroparenchymal scarring. Otherwise, visualized lung apices are clear. IMPRESSION: CT cervical spine: 1. Partially imaged T2 compression fracture,  likely acute. Associated 20% height loss. Recommend dedicated thoracic spine CT for further evaluation. 2. Severe multilevel degenerative change, detailed above. 3. Partial left posterior atlanto-occipital assimilation. CT head: 1. No evidence of acute intracranial abnormality. 2. Moderate chronic microvascular ischemic disease and atrophy. Electronically Signed   By: Margaretha Sheffield M.D.   On: 03/17/2021 13:15   CT Thoracic Spine Wo Contrast  Result Date: 03/17/2021 CLINICAL DATA:  Back pain. Multiple falls over the last 2 weeks with increasing weakness. EXAM: CT THORACIC SPINE WITHOUT CONTRAST TECHNIQUE: Multidetector CT images of the thoracic were obtained using the standard protocol without intravenous contrast. COMPARISON:  CTs of the cervical and lumbar spine earlier today. Abdominopelvic CT 01/10/2021, thoracic spine MRI 05/10/2015 and thoracic radiographs 06/04/2015. FINDINGS: Alignment: Near anatomic. There is a slight degenerative anterolisthesis at C6-7 and C7-T1. Vertebrae: There are multiple thoracic compression deformities. As seen on earlier cervical spine CT, there is a mild superior endplate compression fracture at T2 with approximately 20% loss of vertebral body height and endplate sclerosis. Similar mild superior endplate compression fractures are present at T3 and T4. There is a superior endplate compression fracture at T9 which does not appear acute. Patient is status post spinal augmentation at T11 and T12. A burst fracture at L1 is noted, described on earlier lumbar spine CT. The posterior elements appear intact. Paraspinal and other soft tissues: No significant paraspinal or epidural fluid collections are identified. Mild pulmonary emphysematous changes with scattered calcified granulomas. Disc levels: Multilevel thoracic spondylosis with disc and endplate degeneration and scattered mild facet degenerative changes. There are small disc protrusions, most notable centrally at T4-5, T5-6  and T6-7. No large disc herniation or high-grade spinal stenosis identified. IMPRESSION: 1. Mild superior endplate compression deformities at T2, T3 and T4, likely acute. Each of these fractures is associated with approximately 20% loss of vertebral body height. 2. Superior endplate compression deformity at T9 does not appear acute. 3. Previous spinal augmentation for fractures at T11 and T12. Known burst fracture at L1, described on earlier lumbar spine CT. 4. Mild multilevel spondylosis with small disc protrusions. No high-grade spinal stenosis. Electronically Signed   By: Richardean Sale M.D.   On: 03/17/2021 15:15   CT Lumbar Spine Wo Contrast  Result Date: 03/17/2021 CLINICAL DATA:  Low back pain, trauma, multiple falls EXAM: CT LUMBAR SPINE WITHOUT CONTRAST TECHNIQUE: Multidetector CT imaging of the lumbar spine was performed without intravenous contrast administration. Multiplanar CT image reconstructions were also generated. COMPARISON:  CT 01/10/2021 FINDINGS: Segmentation: 5 lumbar type vertebrae. Alignment: Normal Vertebrae: Prior vertebroplasties at  T11 and T12 without evidence of progressive height loss. There is an acute L1 burst fracture with approximately 60% height loss and bony retropulsion measuring up to 5 mm. Unchanged compression deformities of L4 and L5, with mild retro portion of the superior endplate of L4. Paraspinal and other soft tissues: Prior cholecystectomy. Partially visualized pneumobilia, likely related to reported prior Whipple procedure. There is a nonobstructive 5 mm stone in the left kidney. Unchanged low-density left adrenal nodule, statistically likely to be an adenoma. Disc levels: There is moderate to severe disc height loss at L5-S1. There is disc bulging at L3-L4, L4-L5, and L5-S1 along with severe bilateral facet arthropathy resulting in varying degrees spinal canal and neural foraminal narrowing, most severe canal narrowing at L3-L4 and worst neural foraminal  narrowing at L5-S1. IMPRESSION: Acute L1 burst fracture with approximately 60% height loss and mild bony retropulsion measuring up to 5 mm. Unchanged prior vertebroplasties at T11 and T12 without evidence of progressive height loss. Unchanged compression deformities of L4 and L5. Multilevel degenerative disc disease and facet arthropathy with disc bulging in the lower lumbar spine resulting in varying degrees of spinal canal or neural foraminal narrowing, worst at L3-L4 and L5-S1. Prior Whipple procedure with pneumobilia. Electronically Signed   By: Maurine Simmering M.D.   On: 03/17/2021 13:11   DG Chest Portable 1 View  Result Date: 03/17/2021 CLINICAL DATA:  Preop for hip fracture EXAM: PORTABLE CHEST 1 VIEW COMPARISON:  01/16/2014 FINDINGS: Numerous leads and wires project over the chest. Patient rotated minimally right. Midline trachea. Normal heart size and mediastinal contours for age. No pleural effusion or pneumothorax. Clear lungs. Osteopenia. Vertebral augmentation at 2 lower thoracic levels. Surgical clips in the upper abdomen. IMPRESSION: No acute cardiopulmonary disease. Electronically Signed   By: Abigail Miyamoto M.D.   On: 03/17/2021 14:53   DG C-Arm 1-60 Min-No Report  Result Date: 03/18/2021 Fluoroscopy was utilized by the requesting physician.  No radiographic interpretation.   DG HIP UNILAT WITH PELVIS 1V LEFT  Result Date: 03/18/2021 CLINICAL DATA:  Left hip arthroplasty EXAM: DG HIP (WITH OR WITHOUT PELVIS) 1V*L* COMPARISON:  03/17/2021 FINDINGS: Fluoroscopic images show left hip arthroplasty. Fluoroscopic time was 5 seconds. Estimated radiation dose is 0.43 mGy. Two images are submitted for review. IMPRESSION: Fluoroscopic assistance was provided for left hip arthroplasty. Electronically Signed   By: Elmer Picker M.D.   On: 03/18/2021 12:47   DG HIP UNILAT W OR W/O PELVIS 2-3 VIEWS LEFT  Result Date: 03/18/2021 CLINICAL DATA:  Status post left hip replacement. EXAM: DG HIP  (WITH OR WITHOUT PELVIS) 2-3V LEFT COMPARISON:  Fluoroscopic images of same day. FINDINGS: Left hip prosthesis appears to be well situated. Expected postoperative changes are seen involving the surrounding soft tissues. IMPRESSION: Status post left hip arthroplasty. Electronically Signed   By: Marijo Conception M.D.   On: 03/18/2021 12:52   DG HIP UNILAT WITH PELVIS 2-3 VIEWS LEFT  Result Date: 03/17/2021 CLINICAL DATA:  Fall, left hip pain EXAM: DG HIP (WITH OR WITHOUT PELVIS) 2-3V LEFT COMPARISON:  01/10/2021 FINDINGS: Acute left femoral neck fracture with approximately 2-3 cm of proximal displacement. Left femoral head remains aligned without dislocation. No definite fracture involvement of the intertrochanteric aspect of the proximal femur. Bones appear demineralized. Prior right hip hemiarthroplasty, grossly intact. IMPRESSION: Acute moderately displaced left femoral neck fracture. Electronically Signed   By: Davina Poke D.O.   On: 03/17/2021 12:45   Results for orders placed or performed during the  hospital encounter of 03/17/21  Resp Panel by RT-PCR (Flu A&B, Covid) Nasopharyngeal Swab     Status: None   Collection Time: 03/17/21  3:37 PM   Specimen: Nasopharyngeal Swab; Nasopharyngeal(NP) swabs in vial transport medium  Result Value Ref Range Status   SARS Coronavirus 2 by RT PCR NEGATIVE NEGATIVE Final    Comment: (NOTE) SARS-CoV-2 target nucleic acids are NOT DETECTED.  The SARS-CoV-2 RNA is generally detectable in upper respiratory specimens during the acute phase of infection. The lowest concentration of SARS-CoV-2 viral copies this assay can detect is 138 copies/mL. A negative result does not preclude SARS-Cov-2 infection and should not be used as the sole basis for treatment or other patient management decisions. A negative result may occur with  improper specimen collection/handling, submission of specimen other than nasopharyngeal swab, presence of viral mutation(s) within  the areas targeted by this assay, and inadequate number of viral copies(<138 copies/mL). A negative result must be combined with clinical observations, patient history, and epidemiological information. The expected result is Negative.  Fact Sheet for Patients:  EntrepreneurPulse.com.au  Fact Sheet for Healthcare Providers:  IncredibleEmployment.be  This test is no t yet approved or cleared by the [place] FDA and  has been authorized for detection and/or diagnosis of SARS-CoV-2 by FDA under an Emergency Use Authorization (EUA). This EUA will remain  in effect (meaning this test can be used) for the duration of the COVID-19 declaration under Section 564(b)(1) of the Act, 21 U.S.C.section 360bbb-3(b)(1), unless the authorization is terminated  or revoked sooner.       Influenza A by PCR NEGATIVE NEGATIVE Final   Influenza B by PCR NEGATIVE NEGATIVE Final    Comment: (NOTE) The Xpert Xpress SARS-CoV-2/FLU/RSV plus assay is intended as an aid in the diagnosis of influenza from Nasopharyngeal swab specimens and should not be used as a sole basis for treatment. Nasal washings and aspirates are unacceptable for Xpert Xpress SARS-CoV-2/FLU/RSV testing.  Fact Sheet for Patients: EntrepreneurPulse.com.au  Fact Sheet for Healthcare Providers: IncredibleEmployment.be  This test is not yet approved or cleared by the [place] FDA and has been authorized for detection and/or diagnosis of SARS-CoV-2 by FDA under an Emergency Use Authorization (EUA). This EUA will remain in effect (meaning this test can be used) for the duration of the COVID-19 declaration under Section 564(b)(1) of the Act, 21 U.S.C. section 360bbb-3(b)(1), unless the authorization is terminated or revoked.  Performed at [facility], [address] [place], [place] [address]   Surgical PCR screen     Status: None   Collection  Time: 03/17/21  5:57 PM   Specimen: Nasal Mucosa; Nasal Swab  Result Value Ref Range Status   MRSA, PCR NEGATIVE NEGATIVE Final   Staphylococcus aureus NEGATIVE NEGATIVE Final    Comment: (NOTE) The Xpert SA Assay (FDA approved for NASAL specimens in patients 61 years of age and older), is one component of a comprehensive surveillance program. It is not intended to diagnose infection nor to guide or monitor treatment. Performed at [facility], [place],  [address]   SARS CORONAVIRUS 2 (TAT 6-24 HRS) Nasopharyngeal Nasopharyngeal Swab     Status: None   Collection Time: 03/20/21  1:22 PM   Specimen: Nasopharyngeal Swab  Result Value Ref Range Status   SARS Coronavirus 2 NEGATIVE NEGATIVE Final    Comment: (NOTE) SARS-CoV-2 target nucleic acids are NOT DETECTED.  The SARS-CoV-2 RNA is generally detectable in upper and lower respiratory specimens during  the acute phase of infection. Negative results do not preclude SARS-CoV-2 infection, do not rule out co-infections with other pathogens, and should not be used as the sole basis for treatment or other patient management decisions. Negative results must be combined with clinical observations, patient history, and epidemiological information. The expected result is Negative.  Fact Sheet for Patients: SugarRoll.be  Fact Sheet for Healthcare Providers: https://www.woods-mathews.com/  This test is not yet approved or cleared by the [place] FDA and  has been authorized for detection and/or diagnosis of SARS-CoV-2 by FDA under an Emergency Use Authorization (EUA). This EUA will remain  in effect (meaning this test can be used) for the duration of the COVID-19 declaration under Se ction 564(b)(1) of the Act, 21 U.S.C. section 360bbb-3(b)(1), unless the authorization is terminated or revoked sooner.  Performed at [facility], [place] [address]  [place], [place] [address]     Signed:  Dwyane Dee MD.  [facility] 03/21/2021, 11:20 AM

## 2021-03-21 NOTE — Care Management Important Message (Signed)
Important Message  Patient Details  Name: Kimberly Meyer MRN: [id number] Date of Birth: 02/02/1946   Medicare Important Message Given:  Yes     Dannette Barbara 03/21/2021, 11:36 AM

## 2021-03-21 NOTE — TOC Progression Note (Signed)
Transition of Care [facility]) - Progression Note    Patient Details  Name: Kimberly Meyer MRN: [id number] Date of Birth: 1945/11/06  Transition of Care [facility]) CM/SW [place], RN Phone Number: 03/21/2021, 3:40 PM  Clinical Narrative:   Called EMS to transport to [facility] room 808, Called Son to make aware, EMS has on list but would not provide an Estimated Time of arrival, Notified the nurse         Expected Discharge Plan and Services           Expected Discharge Date: 03/21/21                                     Social Determinants of Health (SDOH) Interventions    Readmission Risk Interventions No flowsheet data found.

## 2021-03-21 NOTE — Progress Notes (Signed)
Pt d/c to [facility] this evening via [facility].  NAD noted at time of d/c.  Earlier today this nurse called report to nurse Caryl Pina.  Pt was d/c with extra dressing along with prevena wound vac.  IV removed from pts L forearm without issue.

## 2021-04-02 ENCOUNTER — Other Ambulatory Visit: Payer: Self-pay

## 2021-04-02 ENCOUNTER — Emergency Department
Admission: EM | Admit: 2021-04-02 | Discharge: 2021-04-02 | Disposition: A | Discharge status: Skilled Nursing Facility | Payer: Medicare Other | Attending: Emergency Medicine | Admitting: Emergency Medicine

## 2021-04-02 ENCOUNTER — Emergency Department: Payer: Medicare Other

## 2021-04-02 DIAGNOSIS — R42 Dizziness and giddiness: Secondary | ICD-10-CM | POA: Insufficient documentation

## 2021-04-02 DIAGNOSIS — Z87891 Personal history of nicotine dependence: Secondary | ICD-10-CM | POA: Diagnosis not present

## 2021-04-02 DIAGNOSIS — R55 Syncope and collapse: Secondary | ICD-10-CM | POA: Diagnosis not present

## 2021-04-02 DIAGNOSIS — Z96641 Presence of right artificial hip joint: Secondary | ICD-10-CM | POA: Diagnosis not present

## 2021-04-02 DIAGNOSIS — E119 Type 2 diabetes mellitus without complications: Secondary | ICD-10-CM | POA: Diagnosis not present

## 2021-04-02 DIAGNOSIS — I10 Essential (primary) hypertension: Secondary | ICD-10-CM | POA: Insufficient documentation

## 2021-04-02 DIAGNOSIS — W19XXXA Unspecified fall, initial encounter: Secondary | ICD-10-CM | POA: Diagnosis not present

## 2021-04-02 DIAGNOSIS — Z79899 Other long term (current) drug therapy: Secondary | ICD-10-CM | POA: Diagnosis not present

## 2021-04-02 DIAGNOSIS — F0390 Unspecified dementia without behavioral disturbance: Secondary | ICD-10-CM | POA: Insufficient documentation

## 2021-04-02 DIAGNOSIS — S22000A Wedge compression fracture of unspecified thoracic vertebra, initial encounter for closed fracture: Secondary | ICD-10-CM | POA: Diagnosis not present

## 2021-04-02 DIAGNOSIS — S22009A Unspecified fracture of unspecified thoracic vertebra, initial encounter for closed fracture: Secondary | ICD-10-CM | POA: Diagnosis present

## 2021-04-02 LAB — COMPREHENSIVE METABOLIC PANEL
ALT: 14 U/L (ref 0–44)
AST: 17 U/L (ref 15–41)
Albumin: 3.1 g/dL — ABNORMAL LOW (ref 3.5–5.0)
Alkaline Phosphatase: 209 U/L — ABNORMAL HIGH (ref 38–126)
Anion gap: 9 (ref 5–15)
BUN: 29 mg/dL — ABNORMAL HIGH (ref 8–23)
CO2: 23 mmol/L (ref 22–32)
Calcium: 8.7 mg/dL — ABNORMAL LOW (ref 8.9–10.3)
Chloride: 102 mmol/L (ref 98–111)
Creatinine, Ser: 0.88 mg/dL (ref 0.44–1.00)
GFR, Estimated: >60 mL/min (ref >60)
Glucose, Bld: 161 mg/dL — ABNORMAL HIGH (ref 70–99)
Potassium: 4.3 mmol/L (ref 3.5–5.1)
Sodium: 134 mmol/L — ABNORMAL LOW (ref 135–145)
Total Bilirubin: 0.9 mg/dL (ref 0.3–1.2)
Total Protein: 6.4 g/dL — ABNORMAL LOW (ref 6.5–8.1)

## 2021-04-02 LAB — CBC
HCT: 41.4 % (ref 36.0–46.0)
Hemoglobin: 13.3 g/dL (ref 12.0–15.0)
MCH: 29.2 pg (ref 26.0–34.0)
MCHC: 32.1 g/dL (ref 30.0–36.0)
MCV: 90.8 fL (ref 80.0–100.0)
Platelets: 352 10*3/uL (ref 150–400)
RBC: 4.56 MIL/uL (ref 3.87–5.11)
RDW: 13.1 % (ref 11.5–15.5)
WBC: 8.3 10*3/uL (ref 4.0–10.5)
nRBC: 0 % (ref 0.0–0.2)

## 2021-04-02 LAB — TROPONIN I (HIGH SENSITIVITY): Troponin I (High Sensitivity): 6 ng/L (ref <18)

## 2021-04-02 NOTE — ED Triage Notes (Signed)
Pt arrives via [facility] from [facility], pt has a recent diagnosis of left hip fracture and L1 fx, pt cont to fall at rehab, pt fell this am, pt was being assisted to the bathroom and nearly passed out with staff. Staff concerned about her low bp, pt's bp for ems was 100/60, 100p, upper 90's sat on room air Pt has no complaints at this time, pt was placed in a recliner by ems

## 2021-04-02 NOTE — ED Provider Notes (Signed)
[facility] Emergency Department Provider Note   ____________________________________________   Event Date/Time   First MD Initiated Contact with Patient 04/02/21 1701     (approximate)  I have reviewed the triage vital signs and the nursing notes.   HISTORY  Chief Complaint Near Syncope    HPI Kimberly Meyer is a 75 y.o. female with past medical history of hypertension, hyperlipidemia, diabetes, GERD, depression, anxiety, remote PE off anticoagulation, ambulatory carcinoma, and dementia who presents to the ED for fall.  History is limited due to patient's baseline dementia, patient states that she does not remember falling earlier today.  Patient's family member at bedside is a Marine scientist and states that patient has been dealing with frequent falls over the past couple of months.  Staff at her nursing facility today was concerned that patient had some lightheadedness and near syncope with the fall.  Patient is not sure whether she hit her head, complains only of some pain in the middle of her upper back.  She was recently admitted to the hospital following fall with hip fracture and compression fractures of thoracic and lumbar spine.  Patient reports the pain in her upper back has been ongoing since before today's fall and is no worse currently.  She denies any pain in her lower back at this time.  She denies having any chest pain or shortness of breath, denies any nausea, vomiting, abdominal pain, or dysuria.        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[place])    Anemia    Anxiety    Chronic tension headache    Depression    GERD (gastroesophageal reflux disease)    History of pulmonary embolus (PE)    Hyperlipemia    Hypertension    IBS (irritable bowel syndrome)    Kidney stone    Low back pain    Situational stress     Patient Active Problem List   Diagnosis Date Noted   Closed left hip fracture ([place]) 03/17/2021   HTN  (hypertension) 03/17/2021   Diabetes mellitus without complication ([place]) 75/64/3329   Dementia ([place]) 03/17/2021   Depression with anxiety 03/17/2021   Fracture of lumbar spine ([place]) 03/17/2021   Fracture of thoracic spine ([place]) 03/17/2021   Fall    NAFLD (nonalcoholic fatty liver disease) 05/24/2017   Age-related osteoporosis without current pathological fracture 03/03/2017   Breast asymmetry 11/19/2016   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[place]) 06/07/2015   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   Chronic tension-type headache, intractable 11/07/2014   Acid reflux 11/07/2014   HLD (hyperlipidemia) 11/07/2014   BP (high blood pressure) 11/07/2014   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   Calculus of kidney 11/07/2014   PE (pulmonary embolism) 11/07/2014   Functional bowel disorder 11/07/2014   History of artificial joint 03/06/2014   Acute situational disturbance 02/21/2014   History of pulmonary embolism 02/27/2009    Past Surgical History:  Procedure Laterality Date   APPENDECTOMY     AUGMENTATION MAMMAPLASTY  1990's   BLADDER SURGERY     bladder tack   BREAST IMPLANT REMOVAL Left 07/15/2016   Procedure: REMOVAL OF EXPOSED LEFT BREAST IMPLANT;  Surgeon: Wallace Going, DO;  Location: [place];  Service: Plastics;  Laterality: Left;   CHOLECYSTECTOMY     COLONOSCOPY     COLONOSCOPY WITH PROPOFOL N/A 07/22/2015   Procedure: COLONOSCOPY WITH PROPOFOL;  Surgeon:  Manya Silvas, MD;  Location[facility] ENDOSCOPY;  Service: Endoscopy;  Laterality: N/A;   CYSTOSCOPY/URETEROSCOPY/HOLMIUM LASER/STENT PLACEMENT Left 11/13/2014   Procedure: CYSTOSCOPY/URETEROSCOPY/HOLMIUM LASER/STENT PLACEMENT;  Surgeon: Hollice Espy, MD;  Location: [facility]S;  Service: Urology;  Laterality: Left;   EXTRACORPOREAL SHOCK WAVE LITHOTRIPSY Left 06/09/2018   Procedure: EXTRACORPOREAL SHOCK WAVE LITHOTRIPSY (ESWL);  Surgeon: Abbie Sons, MD;   Location: [facility] ORS;  Service: Urology;  Laterality: Left;   HIP ARTHROPLASTY Left 03/18/2021   Procedure: ARTHROPLASTY BIPOLAR HIP (HEMIARTHROPLASTY);  Surgeon: Hessie Knows, MD;  Location: [facility] ORS;  Service: Orthopedics;  Laterality: Left;   HIP SURGERY     KYPHOPLASTY N/A 06/04/2015   Procedure: KYPHOPLASTY T11, T12;  Surgeon: Hessie Knows, MD;  Location: [facility] ORS;  Service: Orthopedics;  Laterality: N/A;   LITHOTRIPSY     PANCREATICODUODENECTOMY     secondary to ampullary carcinoma   PLACEMENT OF BREAST IMPLANTS     TUBAL LIGATION     VENTRAL HERNIA REPAIR     WHIPPLE PROCEDURE      Prior to Admission medications   Medication Sig Start Date End Date Taking? Authorizing Provider  buPROPion (WELLBUTRIN SR) 150 MG 12 hr tablet Take 150 mg by mouth 2 (two) times daily. 09/27/14   [provider]  citalopram (CELEXA) 10 MG tablet Take 10 mg by mouth daily.    [provider]  clonazePAM (KLONOPIN) 0.5 MG tablet Take 0.5 mg by mouth at bedtime.    [provider]  docusate sodium (COLACE) 100 MG capsule Take 1 capsule (100 mg total) by mouth 2 (two) times daily. 03/19/21   Duanne Guess, PA-C  donepezil (ARICEPT) 10 MG tablet Take 10 mg by mouth at bedtime.    [provider]  enoxaparin (LOVENOX) 40 MG/0.4ML injection Inject 0.4 mLs (40 mg total) into the skin daily for 14 days. 03/19/21 04/02/21  Duanne Guess, PA-C  lactulose (CHRONULAC) 10 GM/15ML solution Take 30 mLs (20 g total) by mouth daily as needed for mild constipation. 03/21/21   Dwyane Dee, MD  lisinopril (PRINIVIL,ZESTRIL) 10 MG tablet Take 10 mg by mouth daily. 09/27/14   [provider]  oxyCODONE (OXY IR/ROXICODONE) 5 MG immediate release tablet Take 1-2 tablets (5-10 mg total) by mouth every 4 (four) hours as needed for moderate pain (pain score 4-6). 03/19/21   Duanne Guess, PA-C  pantoprazole (PROTONIX) 40 MG tablet Take 40 mg by mouth daily.    [provider]   Potassium Citrate 15 MEQ (1620 MG) TBCR Take 1 tablet by mouth 2 (two) times daily. 04/28/18   Hollice Espy, MD    Allergies Sulfa antibiotics  Family History  Problem Relation Age of Onset   Alzheimer's disease Father    Kidney Stones Father    Stroke Mother    Diabetes type II Mother    Breast cancer Neg Hx     Social History Social History   Tobacco Use   Smoking status: Former    Types: Cigarettes   Smokeless tobacco: Never  Vaping Use   Vaping Use: Never used  Substance Use Topics   Alcohol use: No    Alcohol/week: 0.0 standard drinks   Drug use: No    Review of Systems  Constitutional: No fever/chills Eyes: No visual changes. ENT: No sore throat. Cardiovascular: Denies chest pain.  Positive for lightheadedness and near syncope. Respiratory: Denies shortness of breath. Gastrointestinal: No abdominal pain.  No nausea, no vomiting.  No diarrhea.  No  constipation. Genitourinary: Negative for dysuria. Musculoskeletal: Positive for back pain. Skin: Negative for rash. Neurological: Negative for headaches, focal weakness or numbness.  ____________________________________________   PHYSICAL EXAM:  VITAL SIGNS: ED Triage Vitals  Enc Vitals Group     BP 04/02/21 1252 110/68     Pulse Rate 04/02/21 1252 (!) 101     Resp 04/02/21 1252 18     Temp 04/02/21 1252 98.6 F (37 C)     Temp Source 04/02/21 1252 Oral     SpO2 04/02/21 1252 95 %     Weight 04/02/21 1255 89 lb 15.2 oz (40.8 kg)     Height 04/02/21 1255 4\' 10"  (1.473 m)     Head Circumference --      [facility] Flow --      Pain Score 04/02/21 1255 0     Pain Loc --      Pain Edu? --      Excl. in [place]? --     Constitutional: Alert and oriented. Eyes: Conjunctivae are normal. Head: Atraumatic. Nose: No congestion/rhinnorhea. Mouth/Throat: Mucous membranes are moist. Neck: Normal ROM, no midline cervical spine tenderness to palpation. Cardiovascular: Normal rate, regular rhythm. Grossly normal heart  sounds.  2+ radial pulses bilaterally. Respiratory: Normal respiratory effort.  No retractions. Lungs CTAB. Gastrointestinal: Soft and nontender. No distention. Genitourinary: deferred Musculoskeletal: No lower extremity tenderness nor edema.  TLSO brace in place, midline thoracic spinal tenderness to palpation noted, no midline lumbar spinal tenderness to palpation.  No upper extremity bony tenderness to palpation. Neurologic:  Normal speech and language. No gross focal neurologic deficits are appreciated. Skin:  Skin is warm, dry and intact. No rash noted. Psychiatric: Mood and affect are normal. Speech and behavior are normal.  ____________________________________________   LABS (all labs ordered are listed, but only abnormal results are displayed)  Labs Reviewed  COMPREHENSIVE METABOLIC PANEL - Abnormal; Notable for the following components:      Result Value   Sodium 134 (*)    Glucose, Bld 161 (*)    BUN 29 (*)    Calcium 8.7 (*)    Total Protein 6.4 (*)    Albumin 3.1 (*)    Alkaline Phosphatase 209 (*)    All other components within normal limits  CBC  URINALYSIS, COMPLETE (UACMP) WITH MICROSCOPIC  TROPONIN I (HIGH SENSITIVITY)   ____________________________________________  EKG  ED ECG REPORT I, Blake Divine, the attending physician, personally viewed and interpreted this ECG.   Date: 04/02/2021  EKG Time: 13:08  Rate: 101  Rhythm: sinus tachycardia  Axis: LAD  Intervals:none  ST&T Change: None    PROCEDURES  Procedure(s) performed (including Critical Care):  Procedures   ____________________________________________   INITIAL IMPRESSION / ASSESSMENT AND PLAN / ED COURSE      75 year old female with past medical history of hypertension, hyperlipidemia, diabetes, GERD, depression, remote PE, ampullary carcinoma, and dementia who presents to the ED with lightheadedness, near syncope, and fall earlier today.  Suspicion for cardiac etiology of her  near syncope is low, EKG shows no evidence of arrhythmia or ischemia and patient has a long history of falls that seem to be progressively worsening.  CT head is negative for acute process, we will add on CT of her cervical spine.  Chest x-ray reviewed by me and shows no infiltrate, edema, or effusion.  Chest x-ray is also able to visualize her thoracic spine with no evidence of acute injury.  She has no evidence of traumatic injury to her  extremities.  Labs are unremarkable, no anemia or electrolyte abnormality to contribute to her falls.  We will also check UA.  CT cervical spine is negative for acute process, does redemonstrate known thoracic compression fracture that is similar to previous and correlates to patient's area of tenderness.  Unable to obtain UA, however suspicion for UTI at this time is low.  In further discussion with family, we will hold off on urine sample for now given no symptoms of UTI and no change in mental status.  Patient is appropriate for discharge back to her nursing facility and family was counseled to have her follow-up with PCP for frequent falls.  Family counseled to have patient return to the ED for new worsening symptoms, family agrees with plan.     ____________________________________________   FINAL CLINICAL IMPRESSION(S) / ED DIAGNOSES  Final diagnoses:  Near syncope  Fall, initial encounter  Compression fracture of body of thoracic vertebra [place])     ED Discharge Orders     None        Note:  This document was prepared using Dragon voice recognition software and may include unintentional dictation errors.    Blake Divine, MD 04/02/21 [phone]

## 2021-04-02 NOTE — ED Notes (Signed)
Called EMS for transport back to [facility]

## 2021-04-02 NOTE — ED Provider Notes (Signed)
Emergency Medicine Provider Triage Evaluation Note  Kimberly Meyer , a 75 y.o. female  was evaluated in triage.  Pt complains of frequent falls confusion and fainting spell that occurred at rehab facility today.  Not complaining of any pain.  Does not remember the event occurring this morning.  EMS reports that when they arrived she was not postictal.  They found that her blood pressure was low after this event but it returned to his baseline after period of observation..  Review of Systems  Positive: weakness Negative:   Physical Exam  There were no vitals taken for this visit. Gen:   Awake, frail appearing Resp:  Normal effort  MSK:   No pain with movement of BUE Other:    Medical Decision Making  Medically screening exam initiated at 12:54 PM.  Appropriate orders placed.  Kimberly Meyer was informed that the remainder of the evaluation will be completed by another provider, this initial triage assessment does not replace that evaluation, and the importance of remaining in the ED until their evaluation is complete.     Kimberly Lot, MD 04/02/21 1254

## 2021-04-02 NOTE — ED Notes (Signed)
Report given to [facility] RN at [facility]

## 2021-04-29 ENCOUNTER — Encounter: Payer: Self-pay | Admitting: *Deleted

## 2021-04-29 ENCOUNTER — Emergency Department: Payer: Medicare Other

## 2021-04-29 ENCOUNTER — Other Ambulatory Visit: Payer: Self-pay

## 2021-04-29 ENCOUNTER — Emergency Department
Admission: EM | Admit: 2021-04-29 | Discharge: 2021-04-30 | Disposition: A | Discharge status: Skilled Nursing Facility | Payer: Medicare Other | Attending: Emergency Medicine | Admitting: Emergency Medicine

## 2021-04-29 DIAGNOSIS — Z96642 Presence of left artificial hip joint: Secondary | ICD-10-CM | POA: Diagnosis not present

## 2021-04-29 DIAGNOSIS — Z23 Encounter for immunization: Secondary | ICD-10-CM | POA: Diagnosis not present

## 2021-04-29 DIAGNOSIS — S0003XA Contusion of scalp, initial encounter: Secondary | ICD-10-CM | POA: Diagnosis not present

## 2021-04-29 DIAGNOSIS — F0390 Unspecified dementia without behavioral disturbance: Secondary | ICD-10-CM | POA: Diagnosis not present

## 2021-04-29 DIAGNOSIS — Z79899 Other long term (current) drug therapy: Secondary | ICD-10-CM | POA: Diagnosis not present

## 2021-04-29 DIAGNOSIS — S0990XA Unspecified injury of head, initial encounter: Secondary | ICD-10-CM | POA: Insufficient documentation

## 2021-04-29 DIAGNOSIS — I10 Essential (primary) hypertension: Secondary | ICD-10-CM | POA: Diagnosis not present

## 2021-04-29 DIAGNOSIS — W19XXXA Unspecified fall, initial encounter: Secondary | ICD-10-CM | POA: Insufficient documentation

## 2021-04-29 MED ORDER — TETANUS-DIPHTH-ACELL PERTUSSIS 5-2.5-18.5 LF-MCG/0.5 IM SUSY
0.5000 mL | PREFILLED_SYRINGE | Freq: Once | INTRAMUSCULAR | Status: AC
  Administered 2021-04-29: 0.5 mL via INTRAMUSCULAR
  Filled 2021-04-29: qty 0.5

## 2021-04-29 NOTE — Discharge Instructions (Signed)
CT head and cervical spine were normal today other than soft tissue swelling to the left scalp.  There was a small abrasion present that did not need repair.  Tetanus vaccine was updated today.

## 2021-04-29 NOTE — ED Provider Notes (Incomplete)
[facility] Provider Note    Event Date/Time   First MD Initiated Contact with Patient 04/29/21 2326     (approximate)   History   Fall   HPI  Kimberly Meyer is a 76 y.o. female  ***     [facility].  She states that this was an unwitnessed fall.  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H[facility])    Anemia    Anxiety    Chronic tension headache    Depression    GERD (gastroesophageal reflux disease)    History of pulmonary embolus (PE)    Hyperlipemia    Hypertension    IBS (irritable bowel syndrome)    Kidney stone    Low back pain    Situational stress     Past Surgical History:  Procedure Laterality Date   APPENDECTOMY     AUGMENTATION MAMMAPLASTY  1990's   BLADDER SURGERY     bladder tack   BREAST IMPLANT REMOVAL Left 07/15/2016   Procedure: REMOVAL OF EXPOSED LEFT BREAST IMPLANT;  Surgeon: Wallace Going, DO;  Location: [facility];  Service: Plastics;  Laterality: Left;   CHOLECYSTECTOMY     COLONOSCOPY     COLONOSCOPY WITH PROPOFOL N/A 07/22/2015   Procedure: COLONOSCOPY WITH PROPOFOL;  Surgeon: Manya Silvas, MD;  Location[facility] END[facility]COPY;  Service: Endoscopy;  Laterality: N/A;   CYSTOSCOPY/URETEROSCOPY/HOLMIUM LASER/STENT PLACEMENT Left 11/13/2014   Procedure: CYSTOSCOPY/URETEROSCOPY/HOLMIUM LASER/STENT PLACEMENT;  Surgeon: Hollice Espy, MD;  Location: [facility] ORS;  Service: Urology;  Laterality: Left;   EXTRACORPOREAL SHOCK WAVE LITHOTRIPSY Left 06/09/2018   Procedure: EXTRACORPOREAL SHOCK WAVE LITHOTRIPSY (ESWL);  Surgeon: Abbie Sons, MD;  Location: [facility];  Service: Urology;  Laterality: Left;   HIP ARTHROPLASTY Left 03/18/2021   Procedure: ARTHROPLASTY BIPOLAR HIP (HEMIARTHROPLASTY);  Surgeon: Hessie Knows, MD;  Location: [facility];  Service: Orthopedics;  Laterality: Left;   HIP SURGERY     KYPHOPLASTY N/A 06/04/2015   Procedure: KYPHOPLASTY  T11, T12;  Surgeon: Hessie Knows, MD;  Location: [facility] ORS;  Service: Orthopedics;  Laterality: N/A;   LITHOTRIPSY     PANCREATICODUODENECTOMY     secondary to ampullary carcinoma   PLACEMENT OF BREAST IMPLANTS     TUBAL LIGATION     VENTRAL HERNIA REPAIR     WHIPPLE PROCEDURE       Physical Exam   Triage Vital Signs: ED Triage Vitals  Enc Vitals Group     BP 04/29/21 2106 127/85     Pulse Rate 04/29/21 2106 84     Resp 04/29/21 2106 20     Temp 04/29/21 2106 97.6 F (36.4 C)     Temp Source 04/29/21 2106 Oral     SpO2 04/29/21 2106 98 %     Weight 04/29/21 2105 89 lb (40.4 kg)     Height 04/29/21 2105 4\' 10"  (1.473 m)     Head Circumference --      [facility] Flow --      Pain Score --      Pain Loc --      Pain Edu? --      Excl. in [place]? --     Most recent vital signs: Vitals:   04/29/21 2106  BP: 127/85  Pulse: 84  Resp: 20  Temp: 97.6 F (36.4 C)  SpO2: 98%    *** Constitutional: Alert and oriented. Well appearing and in no  apparent distress. HEENT:      Head: Normocephalic and atraumatic.         Eyes: Conjunctivae are normal. Sclera is non-icteric.       Mouth/Throat: Mucous membranes are moist.       Neck: Supple with no signs of meningismus. Cardiovascular: Regular rate and rhythm. No murmurs, gallops, or rubs. 2+ symmetrical distal pulses are present in all extremities.  Respiratory: Normal respiratory effort. Lungs are clear to auscultation bilaterally.  Gastrointestinal: Soft, non tender, and non distended with positive bowel sounds. No rebound or guarding. Genitourinary: No CVA tenderness. Musculoskeletal:  No edema, cyanosis, or erythema of extremities. Neurologic: Normal speech and language. Face is symmetric. Moving all extremities. No gross focal neurologic deficits are appreciated. Skin: Skin is warm, dry and intact. No rash noted. Psychiatric: Mood and affect are normal. Speech and behavior are normal.  ED Results / Procedures / Treatments    Labs (all labs ordered are listed, but only abnormal results are displayed) Labs Reviewed - No data to display   EKG  ***   RADIOLOGY ***   PROCEDURES:  Critical Care performed: {CriticalCareYesNo:19197::"Yes, see critical care procedure note(s)","No"}  Procedures    IMPRESSION / MDM / [place] / ED COURSE  I reviewed the triage vital signs and the nursing notes.  ***  Ddx: ***   Plan: ***   MEDICATIONS GIVEN IN ED: Medications  Tdap (BOOSTRIX) injection 0.5 mL (0.5 mLs Intramuscular Given 04/29/21 2352)     ED COURSE: ***   Consults: ***   EMR reviewed ***    {Remember to include, when applicable, any/all of the following data: independent review of imaging independent review of labs (comment specifically on pertinent positives and negatives) review of specific prior hospitalizations, PCP/specialist notes, etc. discuss meds given and prescribed document any discussion with consultants (including hospitalists) any clinical decision tools you used and why (PECARN, NEXUS, etc.) did you consider admitting the patient? document social determinants of health affecting patient's care (homelessness, inability to follow up in a timely fashion, etc) document any pre-existing conditions increasing risk on current visit (e.g. diabetes and HTN increasing danger of high-risk chest pain/ACS) describes what meds you gave (especially parenteral) and why any other interventions?:1}      FINAL CLINICAL IMPRESSION(S) / ED DIAGNOSES   Final diagnoses:  None     Rx / DC Orders   ED Discharge Orders     None        Note:  This document was prepared using Dragon voice recognition software and may include unintentional dictation errors.

## 2021-04-29 NOTE — ED Triage Notes (Addendum)
Pt brought in via ems from home place with a fall in the bathroom   no loc.  Hx dementia.  Pt has lac to left side of scalp.  Bleeding controlled.  Denies neck or back pain.  No blood thinners  Pt alert

## 2021-04-30 NOTE — ED Notes (Signed)
Called EMS to take pt back to [place]

## 2021-04-30 NOTE — ED Provider Notes (Addendum)
[facility] Provider Note    Event Date/Time   First MD Initiated Contact with Patient 04/29/21 2326     (approximate)   History   Fall   HPI  Kimberly Meyer is a 76 y.o. female with history of dementia, hypertension, hyperlipidemia who presents to the emergency department with [facility] from [facility] after she had an unwitnessed fall.  Patient unable to provide any history due to her dementia.  Spoke with [name], nursing staff at [facility] who reports the fall was unwitnessed.  Patient not on any blood thinners.  Patient denies any complaints of pain at this time.  She is unable to tell me what happened.     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HCC)    Anemia    Anxiety    Chronic tension headache    Depression    GERD (gastroesophageal reflux disease)    History of pulmonary embolus (PE)    Hyperlipemia    Hypertension    IBS (irritable bowel syndrome)    Kidney stone    Low back pain    Situational stress     Past Surgical History:  Procedure Laterality Date   APPENDECTOMY     AUGMENTATION MAMMAPLASTY  1990's   BLADDER SURGERY     bladder tack   BREAST IMPLANT REMOVAL Left 07/15/2016   Procedure: REMOVAL OF EXPOSED LEFT BREAST IMPLANT;  Surgeon: Wallace Going, DO;  Location: [facility];  Service: Plastics;  Laterality: Left;   CHOLECYSTECTOMY     COLONOSCOPY     COLONOSCOPY WITH PROPOFOL N/A 07/22/2015   Procedure: COLONOSCOPY WITH PROPOFOL;  Surgeon: Manya Silvas, MD;  Location: [facility] ENDOSCOPY;  Service: Endoscopy;  Laterality: N/A;   CYSTOSCOPY/URETEROSCOPY/HOLMIUM LASER/STENT PLACEMENT Left 11/13/2014   Procedure: CYSTOSCOPY/URETEROSCOPY/HOLMIUM LASER/STENT PLACEMENT;  Surgeon: Hollice Espy, MD;  Location: [facility] ORS;  Service: Urology;  Laterality: Left;   EXTRACORPOREAL SHOCK WAVE LITHOTRIPSY Left 06/09/2018   Procedure: EXTRACORPOREAL SHOCK WAVE LITHOTRIPSY (ESWL);  Surgeon:  Abbie Sons, MD;  Location: [facility] ORS;  Service: Urology;  Laterality: Left;   HIP ARTHROPLASTY Left 03/18/2021   Procedure: ARTHROPLASTY BIPOLAR HIP (HEMIARTHROPLASTY);  Surgeon: Hessie Knows, MD;  Location: [facility] OR[facility];  Service: Orthopedics;  Laterality: Left;   HIP SURGERY     KYPHOPLASTY N/A 06/04/2015   Procedure: KYPHOPLASTY T11, T12;  Surgeon: Hessie Knows, MD;  Location: [facility] ORS;  Service: Orthopedics;  Laterality: N/A;   LITHOTRIPSY     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 Authorizing Provider  buPROPion (WELLBUTRIN SR) 150 MG 12 hr tablet Take 150 mg by mouth 2 (two) times daily. 09/27/14   [provider]  citalopram (CELEXA) 10 MG tablet Take 10 mg by mouth daily.    [provider]  clonazePAM (KLONOPIN) 0.5 MG tablet Take 0.5 mg by mouth at bedtime.    [provider]  docusate sodium (COLACE) 100 MG capsule Take 1 capsule (100 mg total) by mouth 2 (two) times daily. 03/19/21   Duanne Guess, PA-C  donepezil (ARICEPT) 10 MG tablet Take 10 mg by mouth at bedtime.    [provider]  enoxaparin (LOVENOX) 40  MG/0.4ML injection Inject 0.4 mLs (40 mg total) into the skin daily for 14 days. 03/19/21 04/02/21  Duanne Guess, PA-C  lactulose (CHRONULAC) 10 GM/15ML solution Take 30 mLs (20 g total) by mouth daily as needed for mild constipation. 03/21/21   Dwyane Dee, MD  lisinopril (PRINIVIL,ZESTRIL) 10 MG tablet Take 10 mg by mouth daily. 09/27/14   [provider]  oxyCODONE (OXY IR/ROXICODONE) 5 MG immediate release tablet Take 1-2 tablets (5-10 mg total) by mouth every 4 (four) hours as needed for moderate pain (pain score 4-6). 03/19/21   Duanne Guess, PA-C  pantoprazole (PROTONIX) 40 MG tablet Take 40 mg by  mouth daily.    [provider]  Potassium Citrate 15 MEQ (1620 MG) TBCR Take 1 tablet by mouth 2 (two) times daily. 04/28/18   Hollice Espy, MD    Physical Exam   Triage Vital Signs: ED Triage Vitals  Enc Vitals Group     BP 04/29/21 2106 127/85     Pulse Rate 04/29/21 2106 84     Resp 04/29/21 2106 20     Temp 04/29/21 2106 97.6 F (36.4 C)     Temp Source 04/29/21 2106 Oral     SpO2 04/29/21 2106 98 %     Weight 04/29/21 2105 89 lb (40.4 kg)     Height 04/29/21 2105 4\' 10"  (1.473 m)     Head Circumference --      Peak Flow --      Pain Score 04/30/21 0013 0     Pain Loc --      Pain Edu? --      Excl. in [place]? --     Most recent vital signs: Vitals:   04/29/21 2106  BP: 127/85  Pulse: 84  Resp: 20  Temp: 97.6 F (36.4 C)  SpO2: 98%     Constitutional: Alert and oriented to person only. Well appearing and in no apparent distress.  Elderly. HEENT:      Head: Normocephalic with small amount of soft tissue swelling over the left parietal region with a superficial abrasion that is hemostatic.      Eyes: Conjunctivae are normal. Sclera is non-icteric.  Extraocular movements appear intact.      Mouth/Throat: Mucous membranes are moist.  No dental injury appreciated.      Neck: Supple with no signs of meningismus.  No midline spinal tenderness or step-off or deformity. Cardiovascular: Regular rate and rhythm. No murmurs, gallops, or rubs.  Respiratory: Normal respiratory effort. Lungs are clear to auscultation bilaterally without wheezing, rhonchi or rales.  No hypoxia. Gastrointestinal: Soft, non tender, and non distended.  No guarding or rebound. Back: No CVA tenderness.  Appears normal.  No midline spinal tenderness or step-off or deformity. Musculoskeletal:  No edema, cyanosis, or erythema of extremities.  Extremities warm and well-perfused. Neurologic: Normal speech and language. Face is symmetric. Moving all extremities.  Skin: Skin is warm, dry and intact.  No rash noted. Psychiatric: Mood and affect are normal.   ED Results / Procedures / Treatments   LABS: (all labs ordered are listed, but only abnormal results are displayed) Labs Reviewed - No data to display   EKG:    RADIOLOGY: My personal review and interpretation: CT head and cervical spine show no acute traumatic injury.  Radiologist reports reviewed by myself:  Left parietal scalp hematoma.  No other acute process noted.   PROCEDURES:  Critical Care performed: No    Procedures  IMPRESSION / MDM / ASSESSMENT AND PLAN / ED COURSE  I reviewed the triage vital signs and the nursing notes.  History provided by nursing home staff, patient.  Patient here after an unwitnessed fall.  Has a scalp hematoma and abrasion.  Denies any complaints of pain.  No other sign of trauma on exam.     DIFFERENTIAL DIAGNOSIS (includes but not limited to):   Skull fracture, intracranial hemorrhage, anemia, electrolyte derangement, UTI, dehydration, arrhythmia, ACS, mechanical fall, CVA.   PLAN: We will obtain CT of the head and cervical spine.  Nursing home staff states patient is at her baseline.  She denies any complaints of pain currently and has no other sign of trauma on exam.   MEDICATIONS GIVEN IN ED: Medications  Tdap (BOOSTRIX) injection 0.5 mL (0.5 mLs Intramuscular Given 04/29/21 2352)     ED COURSE: Patient CT scans were reviewed by myself and radiology and show no acute traumatic injuries other than a left parietal scalp hematoma.  Wound was cleaned and she has a superficial abrasion that does not need repair.  Will discharge back to her nursing facility.  No prescription medications needed at this time as patient is safe to take over-the-counter Tylenol as needed.   CONSULTS: I considered admission for this patient but given she is at her neurologic baseline with reassuring imaging today and is hemodynamically stable, I feel she is safe to be discharged back to her  nursing facility with head injury return precautions.   OUTSIDE RECORDS REVIEWED: I have reviewed records from previous hospitalizations.  It appears her last tetanus vaccine was in 2015.  Given updated tetanus vaccine today.  Based on her previous records, it looks like she has also had multiple falls previously sand he has been seen by Dr. Rudene Christians with orthopedics after left hip hemiarthroplasty and Dr. Lacinda Axon with neurosurgery after a closed compression fracture of the L1 vertebral body.    At this time, I do not feel there is any life-threatening condition present. I have reviewed, interpreted and discussed all results (EKG, imaging, lab, urine as appropriate) and exam findings with patient/family. I have reviewed nursing notes and appropriate previous records.  I feel the patient is safe to be discharged home without further emergent workup and can continue workup as an outpatient as needed. Discussed usual and customary return precautions. Patient/family verbalize understanding and are comfortable with this plan.  Outpatient follow-up has been provided as needed. All questions have been answered.      FINAL CLINICAL IMPRESSION(S) / ED DIAGNOSES   Final diagnoses:  Injury of head, initial encounter  Scalp hematoma, initial encounter     Rx / DC Orders   ED Discharge Orders     None        Note:  This document was prepared using Dragon voice recognition software and may include unintentional dictation errors.   Deshanna Kama, Delice Bison, DO 04/30/21 [place], Delice Bison, DO 04/30/21 [id number]

## 2021-04-30 NOTE — ED Notes (Signed)
Ems arrived and took pt to [facility] while this RN was attending to other pts.  Unable to get rpt set of VS prior to DC.

## 2021-05-01 ENCOUNTER — Emergency Department: Payer: Medicare Other

## 2021-05-01 ENCOUNTER — Emergency Department
Admission: EM | Admit: 2021-05-01 | Discharge: 2021-05-02 | Disposition: A | Discharge status: Home / Self Care | Payer: Medicare Other | Attending: Emergency Medicine | Admitting: Emergency Medicine

## 2021-05-01 ENCOUNTER — Other Ambulatory Visit: Payer: Self-pay

## 2021-05-01 DIAGNOSIS — Y92129 Unspecified place in nursing home as the place of occurrence of the external cause: Secondary | ICD-10-CM | POA: Insufficient documentation

## 2021-05-01 DIAGNOSIS — W01198A Fall on same level from slipping, tripping and stumbling with subsequent striking against other object, initial encounter: Secondary | ICD-10-CM | POA: Insufficient documentation

## 2021-05-01 DIAGNOSIS — S0001XA Abrasion of scalp, initial encounter: Secondary | ICD-10-CM | POA: Diagnosis not present

## 2021-05-01 DIAGNOSIS — E119 Type 2 diabetes mellitus without complications: Secondary | ICD-10-CM | POA: Insufficient documentation

## 2021-05-01 DIAGNOSIS — Z85068 Personal history of other malignant neoplasm of small intestine: Secondary | ICD-10-CM | POA: Diagnosis not present

## 2021-05-01 DIAGNOSIS — R197 Diarrhea, unspecified: Secondary | ICD-10-CM | POA: Diagnosis not present

## 2021-05-01 DIAGNOSIS — W19XXXA Unspecified fall, initial encounter: Secondary | ICD-10-CM

## 2021-05-01 DIAGNOSIS — F0390 Unspecified dementia without behavioral disturbance: Secondary | ICD-10-CM | POA: Diagnosis not present

## 2021-05-01 DIAGNOSIS — S0990XA Unspecified injury of head, initial encounter: Secondary | ICD-10-CM | POA: Diagnosis present

## 2021-05-01 DIAGNOSIS — I10 Essential (primary) hypertension: Secondary | ICD-10-CM | POA: Diagnosis not present

## 2021-05-01 LAB — CBC
HCT: 40.6 % (ref 36.0–46.0)
Hemoglobin: 13.1 g/dL (ref 12.0–15.0)
MCH: 28.6 pg (ref 26.0–34.0)
MCHC: 32.3 g/dL (ref 30.0–36.0)
MCV: 88.6 fL (ref 80.0–100.0)
Platelets: 289 10*3/uL (ref 150–400)
RBC: 4.58 MIL/uL (ref 3.87–5.11)
RDW: 13.4 % (ref 11.5–15.5)
WBC: 8 10*3/uL (ref 4.0–10.5)
nRBC: 0 % (ref 0.0–0.2)

## 2021-05-01 LAB — BASIC METABOLIC PANEL
Anion gap: 10 (ref 5–15)
BUN: 18 mg/dL (ref 8–23)
CO2: 28 mmol/L (ref 22–32)
Calcium: 8.6 mg/dL — ABNORMAL LOW (ref 8.9–10.3)
Chloride: 98 mmol/L (ref 98–111)
Creatinine, Ser: 0.69 mg/dL (ref 0.44–1.00)
GFR, Estimated: >60 mL/min (ref >60)
Glucose, Bld: 125 mg/dL — ABNORMAL HIGH (ref 70–99)
Potassium: 4.1 mmol/L (ref 3.5–5.1)
Sodium: 136 mmol/L (ref 135–145)

## 2021-05-01 NOTE — ED Provider Triage Note (Signed)
Emergency Medicine Provider Triage Evaluation Note  Kimberly Meyer , a 76 y.o. female  was evaluated in triage.  Pt complains of unwitnessed fall, diarrhea, some headache.  Review of Systems  Positive: Headache, neck pain, diarrhea Negative: Vomiting, fever, chills  Physical Exam  There were no vitals taken for this visit. Gen:   Awake, no distress   Resp:  Normal effort  MSK:   Moves extremities without difficulty  Other:  Tender at the posterior scalp, tender along C-spine  Medical Decision Making  Medically screening exam initiated at 12:51 PM.  Appropriate orders placed.  CAMRY ROBELLO was informed that the remainder of the evaluation will be completed by another provider, this initial triage assessment does not replace that evaluation, and the importance of remaining in the ED until their evaluation is complete.     Versie Starks, PA-C 05/01/21 1253

## 2021-05-01 NOTE — ED Triage Notes (Signed)
Pt comes into the ED via EMS from the [facility] with c/o unwitnessed  fall, having diarrhea   97.8 temp 75HR 98%RA 133/73

## 2021-05-01 NOTE — ED Notes (Signed)
This RN was walking by the room when I noticed pt was in the floor on her knees with blood on the L side of her head- found Gwenette Greet, RN to help get pt out of floor and to help clean her up- when asked where she was going, pt stated "I can do it myself"- pt was placed back in bed, Dr Starleen Blue made aware of situation and went to bedside to assess pt

## 2021-05-01 NOTE — ED Notes (Signed)
[facility]   CALLED  FOR TRANSPORT  TO  HOME  PLACE OF  [facility] 

## 2021-05-01 NOTE — Discharge Instructions (Addendum)
Your blood work was reassuring.  CAT scan of your brain and spine did not show any acute injury.  The patient had another fall while in the emergency department and had a CAT scan and C-spine scan done again which do not show any injury.  Patient should not be walking on her own and should have a walker or assistance at all times.

## 2021-05-01 NOTE — ED Provider Notes (Addendum)
[facility] Provider Note    Event Date/Time   First MD Initiated Contact with Patient 05/01/21 1447     (approximate)   History   Fall   HPI  Kimberly Meyer is a 76 y.o. female presents from nursing facility for an unwitnessed fall.  Patient does remember falling and tells me that she tripped, did hit her head is unsure if she lost consciousness.  Apparently the fall was unwitnessed.  She endorses 1 episode of diarrhea this morning but denies abdominal pain nausea vomiting chest pain shortness of breath headache.    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[place])    Anemia    Anxiety    Chronic tension headache    Depression    GERD (gastroesophageal reflux disease)    History of pulmonary embolus (PE)    Hyperlipemia    Hypertension    IBS (irritable bowel syndrome)    Kidney stone    Low back pain    Situational stress     Patient Active Problem List   Diagnosis Date Noted   Closed left hip fracture ([place]) 03/17/2021   HTN (hypertension) 03/17/2021   Diabetes mellitus without complication ([place]) 99/24/2683   Dementia ([place]) 03/17/2021   Depression with anxiety 03/17/2021   Fracture of lumbar spine ([place]) 03/17/2021   Fracture of thoracic spine ([place]) 03/17/2021   Fall    NAFLD (nonalcoholic fatty liver disease) 05/24/2017   Age-related osteoporosis without current pathological fracture 03/03/2017   Breast asymmetry 11/19/2016   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   Ampullary carcinoma ([place]) 11/07/2014   Chronic tension-type headache, intractable 11/07/2014   Acid reflux 11/07/2014   HLD (hyperlipidemia) 11/07/2014   BP (high blood pressure) 11/07/2014   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      Excl. in [place]?     Most recent vital signs: Vitals:   05/01/21 1606 05/01/21 1707  BP: 113/66 136/76  Pulse: 85 86  Resp: 16 18  Temp:    SpO2: 100% 97%     General: Awake, no distress.  CV:  Good peripheral perfusion.  Resp:  Normal effort.  Abd:  No distention.  Soft and nontender throughout Neuro:             Awake, Alert, oriented to person but not place or time. Other:  No signs of trauma to the head chest abdomen or extremities.  Full range of motion the bilateral lower extremities.  Pelvis is stable nontender, no C, T or L-spine tenderness, no chest wall tenderness  ED Results / Procedures / Treatments  Labs (all labs ordered are listed, but only abnormal results are displayed) Labs Reviewed  BASIC METABOLIC PANEL - Abnormal; Notable for the following components:      Result Value   Glucose, Bld 125 (*)    Calcium 8.6 (*)    All other components within normal limits  CBC  URINALYSIS, ROUTINE W REFLEX MICROSCOPIC  CBG MONITORING, ED     EKG  EKG interpreted by myself, left anterior fascicular block, normal intervals, no acute ischemic changes   RADIOLOGY I reviewed the CT of the cervical spine which does not show any acute fracture or misalignment; agree with radiology report   I reviewed the CT scan of the brain which does not show any acute intracranial process; agree with radiology report     PROCEDURES:  Critical Care performed: No    MEDICATIONS ORDERED IN ED: Medications - No data to display   IMPRESSION / MDM / [place] / ED COURSE  I reviewed  the triage vital signs and the nursing notes.                              Differential diagnosis includes, but is not limited to, syncope, mechanical fall, viral illness, intracranial hemorrhage, skull fracture  Patient is a 76 year old female with a history of dementia hypertension hyperlipidemia presents after an unwitnessed fall.  She is in a nursing facility.  Patient does tell me that she tripped and fell but somewhat unclear about the details.  She denies any complaints at this time.  Does say that she had an episode of diarrhea but denies abdominal pain nausea vomiting fevers or chills.  Vital signs are within normal limits.  Patient looks well on exam.  She has no signs of trauma abdomen is nontender and she has normal range of motion of her extremities.  Basic labs were obtained from drainage including a BMP and CBC which are unremarkable, there is no leukocytosis no anemia no electrolyte abnormality.  Her EKG has a left anterior fascicular block but otherwise no acute changes.  CT head and C-spine are without acute injury.  Given she has no injuries and no identified underlying process going on that could have caused the fall I think she is stable for discharge.  While awaiting transport back to her facility patient was found on her knees on the floor with soft tissue swelling and some bleeding on the left frontal scalp.  Patient not really sure why she fell.  On exam she has some swelling noted an abrasion on the left frontal scalp but no laceration or palpable defect.  No other new tenderness or signs of trauma.  Repeat CT head and C-spine were performed which not show any acute abnormality.  Patient should not be walking on her own and really needs to have a walker and constant supervision.     FINAL CLINICAL IMPRESSION(S) / ED DIAGNOSES   Final diagnoses:  Fall, initial encounter     Rx / DC Orders   ED Discharge Orders     None        Note:  This document was prepared  using Dragon voice recognition software and may include unintentional dictation errors.   Rada Hay, MD 05/01/21 1523    Rada Hay, MD 05/01/21 1919

## 2021-06-15 ENCOUNTER — Emergency Department: Payer: Medicare Other

## 2021-06-15 ENCOUNTER — Other Ambulatory Visit: Payer: Self-pay

## 2021-06-15 ENCOUNTER — Emergency Department
Admission: EM | Admit: 2021-06-15 | Discharge: 2021-06-15 | Disposition: A | Discharge status: Home / Self Care | Payer: Medicare Other | Attending: Emergency Medicine | Admitting: Emergency Medicine

## 2021-06-15 ENCOUNTER — Encounter: Payer: Self-pay | Admitting: Emergency Medicine

## 2021-06-15 DIAGNOSIS — W01198A Fall on same level from slipping, tripping and stumbling with subsequent striking against other object, initial encounter: Secondary | ICD-10-CM | POA: Insufficient documentation

## 2021-06-15 DIAGNOSIS — F0390 Unspecified dementia without behavioral disturbance: Secondary | ICD-10-CM | POA: Diagnosis not present

## 2021-06-15 DIAGNOSIS — E119 Type 2 diabetes mellitus without complications: Secondary | ICD-10-CM | POA: Diagnosis not present

## 2021-06-15 DIAGNOSIS — Y92191 Dining room in other specified residential institution as the place of occurrence of the external cause: Secondary | ICD-10-CM | POA: Insufficient documentation

## 2021-06-15 DIAGNOSIS — S0001XA Abrasion of scalp, initial encounter: Secondary | ICD-10-CM | POA: Diagnosis not present

## 2021-06-15 DIAGNOSIS — S0990XA Unspecified injury of head, initial encounter: Secondary | ICD-10-CM

## 2021-06-15 DIAGNOSIS — S0091XA Abrasion of unspecified part of head, initial encounter: Secondary | ICD-10-CM | POA: Insufficient documentation

## 2021-06-15 DIAGNOSIS — I10 Essential (primary) hypertension: Secondary | ICD-10-CM | POA: Insufficient documentation

## 2021-06-15 NOTE — Discharge Instructions (Signed)
Kimberly Meyer had a neurologic examination, which is normal.  She had a CT of her head and cervical spine, both of which are negative for any injuries.  She has an abrasion to the back of her head which does not require stitches or staples.  She should return to the emergency department for recurrent falls or weakness, change in mental status, severe headache, vomiting, or any other new or worsening symptoms that are concerning.

## 2021-06-15 NOTE — ED Provider Notes (Signed)
[facility] Provider Note    Event Date/Time   First MD Initiated Contact with Patient 06/15/21 1757     (approximate)   History   Fall   HPI  Level 5 caveat: History of present illness limited due to dementia  Kimberly Meyer is a 76 y.o. female with history of dementia, PE, diabetes, and hypertension who dents from her nursing facility after she had a witnessed fall this afternoon.  Per EMS, the patient got up from the table and then fell backwards.  She had no LOC.  She had some bleeding from the back of her head.  The patient herself has dementia and is unable to give any history about the event.     Physical Exam   Triage Vital Signs: ED Triage Vitals  Enc Vitals Group     BP 06/15/21 1751 118/75     Pulse Rate 06/15/21 1751 95     Resp 06/15/21 1751 16     Temp 06/15/21 1757 98.2 F (36.8 C)     Temp Source 06/15/21 1757 Oral     SpO2 06/15/21 1754 100 %     Weight 06/15/21 1750 88 lb (39.9 kg)     Height 06/15/21 1750 4\' 10"  (1.473 m)     Head Circumference --      Peak Flow --      Pain Score --      Pain Loc --      Pain Edu? --      Excl. in [place]? --     Most recent vital signs: Vitals:   06/15/21 1754 06/15/21 1757  BP:    Pulse:    Resp:    Temp:  98.2 F (36.8 C)  SpO2: 100%      General: Alert, confused.  Well appearing, no distress. CV:  Good peripheral perfusion.  Resp:  Normal effort.  Abd:  No distention.  Other:  1 cm superficial abrasion to the back of the head.  No active bleeding.  Motor intact in all extremities.  Normal coordination.  No midline spinal tenderness.   ED Results / Procedures / Treatments   Labs (all labs ordered are listed, but only abnormal results are displayed) Labs Reviewed - No data to display   EKG     RADIOLOGY  CT head: I independently viewed and interpreted the images; there is no evidence of ICH or other acute traumatic finding  CT cervical spine: I independently viewed  and interpreted the images; there is no evidence of acute fracture   PROCEDURES:  Critical Care performed: No  Procedures   MEDICATIONS ORDERED IN ED: Medications - No data to display   IMPRESSION / MDM / [place] / ED COURSE  I reviewed the triage vital signs and the nursing notes.  76 year old female with a history of dementia and other PMH as noted above presents after a witnessed fall from standing height which is consistent with a mechanical fall.  The patient herself is unable to give any history about the event.  She denies any acute complaints.  On exam she has a small abrasion to the back of her head not requiring any repair.  Her vital signs are normal.  She is well-appearing.  Neurologic exam is nonfocal.  There is no midline spinal tenderness.    Overall presentation is consistent with minor head injury due to mechanical fall.  CT head and cervical spine were obtained and do not show  any acute traumatic findings.  At this time, the patient is stable for discharge back to her facility.  There is no evidence of underlying acute medical issue or indication for further ED work-up.  Discharge instructions and return precautions have been provided in the written discharge paperwork.    FINAL CLINICAL IMPRESSION(S) / ED DIAGNOSES   Final diagnoses:  Abrasion of scalp, initial encounter  Minor head injury, initial encounter     Rx / DC Orders   ED Discharge Orders     None        Note:  This document was prepared using Dragon voice recognition software and may include unintentional dictation errors.    Arta Silence, MD 06/15/21 Bosie Helper

## 2021-06-15 NOTE — ED Notes (Signed)
Called [facility] for transport to [facility]

## 2021-06-15 NOTE — ED Triage Notes (Signed)
Pt to ED via [facility] from [facility]. Pt was in the dining area and got up from the table and fell backwards hitting her head. Staff at [facility] denies LOC. Per EMS Pt has a laceration and hematoma on the back of her head. Pt is not on blood thinners. Pt has hx/o Alzheimer's. Pt is in NAD.

## 2021-06-15 NOTE — ED Notes (Signed)
Pt back from CT. This tech and tech Myra put bed alarm on pt's bed and turned it on. Pt has call bell on railing. Pt was instructed to use call bell and not to get up. Pt verbalized understanding.

## 2021-07-26 ENCOUNTER — Emergency Department

## 2021-07-26 ENCOUNTER — Other Ambulatory Visit: Payer: Self-pay

## 2021-07-26 ENCOUNTER — Inpatient Hospital Stay
Admission: EM | Admit: 2021-07-26 | Discharge: 2021-07-28 | DRG: 176 | Disposition: A | Discharge status: Nursing Home (Includes ICF) | Source: Skilled Nursing Facility | Attending: Hospitalist | Admitting: Hospitalist

## 2021-07-26 DIAGNOSIS — Z66 Do not resuscitate: Secondary | ICD-10-CM | POA: Diagnosis present

## 2021-07-26 DIAGNOSIS — Z9181 History of falling: Secondary | ICD-10-CM | POA: Diagnosis not present

## 2021-07-26 DIAGNOSIS — K219 Gastro-esophageal reflux disease without esophagitis: Secondary | ICD-10-CM | POA: Diagnosis present

## 2021-07-26 DIAGNOSIS — I2699 Other pulmonary embolism without acute cor pulmonale: Principal | ICD-10-CM | POA: Diagnosis present

## 2021-07-26 DIAGNOSIS — Z86711 Personal history of pulmonary embolism: Secondary | ICD-10-CM

## 2021-07-26 DIAGNOSIS — F03C4 Unspecified dementia, severe, with anxiety: Secondary | ICD-10-CM | POA: Diagnosis present

## 2021-07-26 DIAGNOSIS — Z79899 Other long term (current) drug therapy: Secondary | ICD-10-CM

## 2021-07-26 DIAGNOSIS — E119 Type 2 diabetes mellitus without complications: Secondary | ICD-10-CM | POA: Diagnosis present

## 2021-07-26 DIAGNOSIS — Z96642 Presence of left artificial hip joint: Secondary | ICD-10-CM | POA: Diagnosis present

## 2021-07-26 DIAGNOSIS — Z20822 Contact with and (suspected) exposure to covid-19: Secondary | ICD-10-CM | POA: Diagnosis present

## 2021-07-26 DIAGNOSIS — Z87442 Personal history of urinary calculi: Secondary | ICD-10-CM | POA: Diagnosis not present

## 2021-07-26 DIAGNOSIS — F32A Depression, unspecified: Secondary | ICD-10-CM | POA: Diagnosis present

## 2021-07-26 DIAGNOSIS — F03C3 Unspecified dementia, severe, with mood disturbance: Secondary | ICD-10-CM | POA: Diagnosis present

## 2021-07-26 DIAGNOSIS — Z87891 Personal history of nicotine dependence: Secondary | ICD-10-CM | POA: Diagnosis not present

## 2021-07-26 DIAGNOSIS — W06XXXA Fall from bed, initial encounter: Secondary | ICD-10-CM | POA: Diagnosis present

## 2021-07-26 DIAGNOSIS — M8588 Other specified disorders of bone density and structure, other site: Secondary | ICD-10-CM | POA: Diagnosis present

## 2021-07-26 DIAGNOSIS — I2692 Saddle embolus of pulmonary artery without acute cor pulmonale: Secondary | ICD-10-CM

## 2021-07-26 DIAGNOSIS — Z833 Family history of diabetes mellitus: Secondary | ICD-10-CM | POA: Diagnosis not present

## 2021-07-26 DIAGNOSIS — K760 Fatty (change of) liver, not elsewhere classified: Secondary | ICD-10-CM

## 2021-07-26 DIAGNOSIS — F0390 Unspecified dementia without behavioral disturbance: Secondary | ICD-10-CM | POA: Diagnosis not present

## 2021-07-26 DIAGNOSIS — E785 Hyperlipidemia, unspecified: Secondary | ICD-10-CM | POA: Diagnosis present

## 2021-07-26 DIAGNOSIS — R0602 Shortness of breath: Secondary | ICD-10-CM | POA: Diagnosis present

## 2021-07-26 DIAGNOSIS — Z9049 Acquired absence of other specified parts of digestive tract: Secondary | ICD-10-CM | POA: Diagnosis not present

## 2021-07-26 DIAGNOSIS — Z820 Family history of epilepsy and other diseases of the nervous system: Secondary | ICD-10-CM

## 2021-07-26 DIAGNOSIS — W19XXXA Unspecified fall, initial encounter: Secondary | ICD-10-CM | POA: Diagnosis not present

## 2021-07-26 DIAGNOSIS — Y92092 Bedroom in other non-institutional residence as the place of occurrence of the external cause: Secondary | ICD-10-CM

## 2021-07-26 DIAGNOSIS — I10 Essential (primary) hypertension: Secondary | ICD-10-CM | POA: Diagnosis present

## 2021-07-26 DIAGNOSIS — F418 Other specified anxiety disorders: Secondary | ICD-10-CM | POA: Diagnosis present

## 2021-07-26 DIAGNOSIS — R296 Repeated falls: Secondary | ICD-10-CM | POA: Diagnosis present

## 2021-07-26 DIAGNOSIS — S0003XA Contusion of scalp, initial encounter: Secondary | ICD-10-CM | POA: Diagnosis present

## 2021-07-26 DIAGNOSIS — Z8509 Personal history of malignant neoplasm of other digestive organs: Secondary | ICD-10-CM

## 2021-07-26 DIAGNOSIS — Z823 Family history of stroke: Secondary | ICD-10-CM

## 2021-07-26 LAB — CBC WITH DIFFERENTIAL/PLATELET
Abs Immature Granulocytes: 0.05 10*3/uL (ref 0.00–0.07)
Basophils Absolute: 0 10*3/uL (ref 0.0–0.1)
Basophils Relative: 0 %
Eosinophils Absolute: 0.1 10*3/uL (ref 0.0–0.5)
Eosinophils Relative: 1 %
HCT: 38.3 % (ref 36.0–46.0)
Hemoglobin: 12.2 g/dL (ref 12.0–15.0)
Immature Granulocytes: 1 %
Lymphocytes Relative: 17 %
Lymphs Abs: 1.6 10*3/uL (ref 0.7–4.0)
MCH: 28.3 pg (ref 26.0–34.0)
MCHC: 31.9 g/dL (ref 30.0–36.0)
MCV: 88.9 fL (ref 80.0–100.0)
Monocytes Absolute: 0.6 10*3/uL (ref 0.1–1.0)
Monocytes Relative: 6 %
Neutro Abs: 6.8 10*3/uL (ref 1.7–7.7)
Neutrophils Relative %: 75 %
Platelets: 241 10*3/uL (ref 150–400)
RBC: 4.31 MIL/uL (ref 3.87–5.11)
RDW: 14.5 % (ref 11.5–15.5)
WBC: 9.1 10*3/uL (ref 4.0–10.5)
nRBC: 0 % (ref 0.0–0.2)

## 2021-07-26 LAB — RESP PANEL BY RT-PCR (FLU A&B, COVID) ARPGX2
Influenza A by PCR: NEGATIVE
Influenza B by PCR: NEGATIVE
SARS Coronavirus 2 by RT PCR: NEGATIVE

## 2021-07-26 LAB — PROTIME-INR
INR: 1.1 (ref 0.8–1.2)
Prothrombin Time: 14.1 seconds (ref 11.4–15.2)

## 2021-07-26 LAB — COMPREHENSIVE METABOLIC PANEL
ALT: 13 U/L (ref 0–44)
AST: 17 U/L (ref 15–41)
Albumin: 2.6 g/dL — ABNORMAL LOW (ref 3.5–5.0)
Alkaline Phosphatase: 146 U/L — ABNORMAL HIGH (ref 38–126)
Anion gap: 10 (ref 5–15)
BUN: 13 mg/dL (ref 8–23)
CO2: 27 mmol/L (ref 22–32)
Calcium: 8.4 mg/dL — ABNORMAL LOW (ref 8.9–10.3)
Chloride: 104 mmol/L (ref 98–111)
Creatinine, Ser: 0.86 mg/dL (ref 0.44–1.00)
GFR, Estimated: >60 mL/min (ref >60)
Glucose, Bld: 133 mg/dL — ABNORMAL HIGH (ref 70–99)
Potassium: 4 mmol/L (ref 3.5–5.1)
Sodium: 141 mmol/L (ref 135–145)
Total Bilirubin: 0.8 mg/dL (ref 0.3–1.2)
Total Protein: 6.4 g/dL — ABNORMAL LOW (ref 6.5–8.1)

## 2021-07-26 LAB — APTT: aPTT: 27 seconds (ref 24–36)

## 2021-07-26 LAB — HEPARIN LEVEL (UNFRACTIONATED): Heparin Unfractionated: 0.25 IU/mL — ABNORMAL LOW (ref 0.30–0.70)

## 2021-07-26 LAB — TROPONIN I (HIGH SENSITIVITY)
Troponin I (High Sensitivity): 11 ng/L (ref <18)
Troponin I (High Sensitivity): 10 ng/L (ref <18)

## 2021-07-26 LAB — BRAIN NATRIURETIC PEPTIDE: B Natriuretic Peptide: 81.4 pg/mL (ref 0.0–100.0)

## 2021-07-26 MED ORDER — HEPARIN BOLUS VIA INFUSION
600.0000 [IU] | Freq: Once | INTRAVENOUS | Status: AC
  Administered 2021-07-26: 600 [IU] via INTRAVENOUS
  Filled 2021-07-26: qty 600

## 2021-07-26 MED ORDER — HEPARIN (PORCINE) 25000 UT/250ML-% IV SOLN
1000.0000 [IU]/h | INTRAVENOUS | Status: DC
  Administered 2021-07-26: 700 [IU]/h via INTRAVENOUS
  Administered 2021-07-27: 900 [IU]/h via INTRAVENOUS
  Filled 2021-07-26: qty 250

## 2021-07-26 MED ORDER — MELATONIN 5 MG PO TABS
5.0000 mg | ORAL_TABLET | Freq: Every day | ORAL | Status: DC
  Administered 2021-07-26 – 2021-07-27 (×2): 5 mg via ORAL
  Filled 2021-07-26 (×2): qty 1

## 2021-07-26 MED ORDER — POTASSIUM CITRATE ER 15 MEQ (1620 MG) PO TBCR: 1.0000 | EXTENDED_RELEASE_TABLET | Freq: Two times a day (BID) | ORAL | Status: DC

## 2021-07-26 MED ORDER — LORAZEPAM 0.5 MG PO TABS
0.5000 mg | ORAL_TABLET | Freq: Three times a day (TID) | ORAL | Status: DC | PRN
  Administered 2021-07-27: 0.5 mg via ORAL
  Filled 2021-07-26: qty 1

## 2021-07-26 MED ORDER — LACTULOSE 10 GM/15ML PO SOLN: 20.0000 g | Freq: Every day | ORAL | Status: DC | PRN

## 2021-07-26 MED ORDER — HEPARIN BOLUS VIA INFUSION
2500.0000 [IU] | Freq: Once | INTRAVENOUS | Status: AC
  Administered 2021-07-26: 2500 [IU] via INTRAVENOUS
  Filled 2021-07-26: qty 2500

## 2021-07-26 MED ORDER — PANTOPRAZOLE SODIUM 40 MG PO TBEC
40.0000 mg | DELAYED_RELEASE_TABLET | Freq: Every day | ORAL | Status: DC
  Administered 2021-07-26 – 2021-07-28 (×3): 40 mg via ORAL
  Filled 2021-07-26 (×3): qty 1

## 2021-07-26 MED ORDER — SODIUM CHLORIDE 0.9 % IV SOLN
250.0000 mL | INTRAVENOUS | Status: DC | PRN
Start: 2021-07-26 — End: 2021-07-28

## 2021-07-26 MED ORDER — ONDANSETRON HCL 4 MG/2ML IJ SOLN: 4.0000 mg | Freq: Four times a day (QID) | INTRAMUSCULAR | Status: DC | PRN

## 2021-07-26 MED ORDER — LISINOPRIL 10 MG PO TABS
10.0000 mg | ORAL_TABLET | Freq: Every day | ORAL | Status: DC
  Administered 2021-07-26 – 2021-07-28 (×3): 10 mg via ORAL
  Filled 2021-07-26 (×3): qty 1

## 2021-07-26 MED ORDER — SODIUM CHLORIDE 0.9% FLUSH: 3.0000 mL | INTRAVENOUS | Status: DC | PRN

## 2021-07-26 MED ORDER — MIRTAZAPINE 15 MG PO TABS
7.5000 mg | ORAL_TABLET | Freq: Every day | ORAL | Status: DC
  Administered 2021-07-26 – 2021-07-27 (×2): 7.5 mg via ORAL
  Filled 2021-07-26 (×2): qty 1

## 2021-07-26 MED ORDER — ACETAMINOPHEN 650 MG RE SUPP: 650.0000 mg | Freq: Four times a day (QID) | RECTAL | Status: DC | PRN

## 2021-07-26 MED ORDER — ONDANSETRON HCL 4 MG PO TABS: 4.0000 mg | ORAL_TABLET | Freq: Four times a day (QID) | ORAL | Status: DC | PRN

## 2021-07-26 MED ORDER — DOCUSATE SODIUM 100 MG PO CAPS
100.0000 mg | ORAL_CAPSULE | Freq: Two times a day (BID) | ORAL | Status: DC
  Administered 2021-07-26 – 2021-07-28 (×5): 100 mg via ORAL
  Filled 2021-07-26 (×5): qty 1

## 2021-07-26 MED ORDER — ACETAMINOPHEN 325 MG PO TABS
650.0000 mg | ORAL_TABLET | Freq: Four times a day (QID) | ORAL | Status: DC | PRN
  Administered 2021-07-27: 650 mg via ORAL
  Filled 2021-07-26: qty 2

## 2021-07-26 MED ORDER — IOHEXOL 350 MG/ML SOLN
75.0000 mL | Freq: Once | INTRAVENOUS | Status: AC | PRN
  Administered 2021-07-26: 75 mL via INTRAVENOUS
  Filled 2021-07-26: qty 75

## 2021-07-26 MED ORDER — BUPROPION HCL ER (SR) 150 MG PO TB12
150.0000 mg | ORAL_TABLET | Freq: Two times a day (BID) | ORAL | Status: DC
  Administered 2021-07-26 – 2021-07-28 (×4): 150 mg via ORAL
  Filled 2021-07-26 (×5): qty 1

## 2021-07-26 MED ORDER — SODIUM CHLORIDE 0.9% FLUSH
3.0000 mL | Freq: Two times a day (BID) | INTRAVENOUS | Status: DC
  Administered 2021-07-26 – 2021-07-28 (×2): 3 mL via INTRAVENOUS

## 2021-07-26 MED ORDER — POTASSIUM CITRATE ER 10 MEQ (1080 MG) PO TBCR
10.0000 meq | EXTENDED_RELEASE_TABLET | Freq: Three times a day (TID) | ORAL | Status: DC
  Administered 2021-07-27 (×3): 10 meq via ORAL
  Filled 2021-07-26 (×6): qty 1

## 2021-07-26 NOTE — ED Provider Notes (Signed)
? ?[facility] ?Provider Note ? ? ? Event Date/Time  ? First MD Initiated Contact with Patient 07/26/21 [phone]   ?  (approximate) ? ? ?History  ? ?Chief Complaint ?Fall ? ? ?HPI ?Kimberly Meyer is a 76 y.o. female, history of dementia, ambulatory carcinoma, hyperlipidemia, hypertension, pulmonary embolism, diabetes, presents the emergency department for evaluation of injury sustained from fall.  Patient arrived via EMS from [facility] [place] for reportedly unwitnessed fall out of bed.  Patient states that she was feeling short of breath earlier this morning, then reportedly fell out of her bed onto her left side.  She states that she does not remember if she passed out or not.  She does endorse hitting the back of her head on something.  Denies blood thinner use.  Denies nausea/vomiting.  She is currently endorsing pain along the left side of her chest and the back of her head.  Denies fever/chills, abdominal pain, back pain, neck pain, urinary symptoms, numbness/tingling in upper or lower extremities, rashes, dizziness, or lightheadedness. ? ?Of note, patient has presented to the emergency department on 06/15/2021, as well as 05/01/2021, for similar presentations of unwitnessed falls.  Work-ups have been unremarkable, no significant injuries. ? ?History Limitations: No limitations. ? ?  ? ? ?Physical Exam  ?Triage Vital Signs: ?ED Triage Vitals  ?Enc Vitals Group  ?   BP 07/26/21 0818 (!) 121/101  ?   Pulse Rate 07/26/21 0818 95  ?   Resp 07/26/21 0818 18  ?   Temp 07/26/21 0818 98.1 ?F (36.7 ?C)  ?   Temp Source 07/26/21 0818 Oral  ?   SpO2 07/26/21 0818 95 %  ?   Weight 07/26/21 0816 90 lb (40.8 kg)  ?   Height 07/26/21 0816 '5\' 1"'$  (1.549 m)  ?   Head Circumference --   ?   Peak Flow --   ?   Pain Score 07/26/21 0815 10  ?   Pain Loc --   ?   Pain Edu? --   ?   Excl. in [place]? --   ? ? ?Most recent vital signs: ?Vitals:  ? 07/26/21 1259 07/26/21 1358  ?BP: 121/81 130/82  ?Pulse: 86 92   ?Resp: 18 19  ?Temp: 98 ?F (36.7 ?C) 98.3 ?F (36.8 ?C)  ?SpO2: 96% 95%  ? ? ?General: Awake, NAD.  ?Skin: Warm, dry.  ?CV: Good peripheral perfusion.  ?Resp: Normal effort.  Lung sounds are clear bilaterally in the apices and bases. ?Abd: Soft, non-tender. No distention.  ?Neuro: At baseline. No gross neurological deficits.  5/5 strength in upper and lower extremities. ?Other: Mild contusion present along the back of her head, no lacerations.  PERRL.  EOMI.  Normal range of motion in the neck.  No midline spinal tenderness.  Stable pelvis.  Mild chest wall tenderness, particularly on the lower left side.  Normal range of motion in all extremities.  Pulse, motor, sensation intact in upper and lower extremities ? ?Physical Exam ? ? ? ?ED Results / Procedures / Treatments  ?Labs ?(all labs ordered are listed, but only abnormal results are displayed) ?Labs Reviewed  ?COMPREHENSIVE METABOLIC PANEL - Abnormal; Notable for the following components:  ?    Result Value  ? Glucose, Bld 133 (*)   ? Calcium 8.4 (*)   ? Total Protein 6.4 (*)   ? Albumin 2.6 (*)   ? Alkaline Phosphatase 146 (*)   ? All other components within normal  limits  ?RESP PANEL BY RT-PCR (FLU A&B, COVID) ARPGX2  ?CBC WITH DIFFERENTIAL/PLATELET  ?BRAIN NATRIURETIC PEPTIDE  ?APTT  ?PROTIME-INR  ?HEPARIN LEVEL (UNFRACTIONATED)  ?TROPONIN I (HIGH SENSITIVITY)  ?TROPONIN I (HIGH SENSITIVITY)  ? ? ? ?EKG ?Sinus rhythm, rate 79, no T-segment changes, no AV blocks, no axis deviations, normal QRS interval ? ? ?RADIOLOGY ? ?ED Provider Interpretation: I personally reviewed and interpreted these x-ray findings and CT.  Chest x-ray shows no remarkable evidence of pneumonia.  Head CT shows no acute intracranial findings.  CT angio does show evidence suggestive of pulmonary embolism.  CT cervical spine shows no acute fractures based on my interpretation ? ?DG Chest 2 View ? ?Result Date: 07/26/2021 ?CLINICAL DATA:  76 year old female with chest pain after fall from bed.  Left side rib pain. EXAM: CHEST - 2 VIEW COMPARISON:  Portable chest 04/02/2021. FINDINGS: Seated AP and lateral views of the chest. Osteopenia with multilevel lower thoracic and lumbar compression fractures, some previously augmented. Chronic proximal right humerus fracture. Artifact from breast implants felt to explain asymmetric lower lung opacity. No pneumothorax or pulmonary edema. No pleural effusion on the lateral view. No definite acute pulmonary opacity. Normal cardiac size and mediastinal contours. Visualized tracheal air column is within normal limits. Previous abdominal hernia repair with mesh. Negative visible bowel gas. IMPRESSION: 1. No definite acute cardiopulmonary abnormality or acute traumatic injury identified; breast implant artifact felt to explain left greater than right lung base opacity. 2. Osteopenia with chronic spinal compression fractures and chronic right humerus fracture. Electronically Signed   By: Genevie Ann M.D.   On: 07/26/2021 09:02  ? ?CT Head Wo Contrast ? ?Result Date: 07/26/2021 ?CLINICAL DATA:  76 year old female with chest pain after fall from bed. Left side rib pain. EXAM: CT HEAD WITHOUT CONTRAST TECHNIQUE: Contiguous axial images were obtained from the base of the skull through the vertex without intravenous contrast. RADIATION DOSE REDUCTION: This exam was performed according to the departmental dose-optimization program which includes automated exposure control, adjustment of the mA and/or kV according to patient size and/or use of iterative reconstruction technique. COMPARISON:  Head CT 06/15/2021. Brain MRI 12/30/2018. FINDINGS: Brain: Stable cerebral volume. No midline shift, ventriculomegaly, mass effect, evidence of mass lesion, intracranial hemorrhage or evidence of cortically based acute infarction. Advanced, confluent bilateral cerebral white matter hypodensity appears stable since February. Vascular: Calcified atherosclerosis at the skull base. No suspicious  intracranial vascular hyperdensity. Skull: Cervical spine reported separately. No skull fracture identified. Sinuses/Orbits: Visualized paranasal sinuses and mastoids are stable and well aerated. Other: Mild left posterior convexity scalp hematoma on series 3, image 40. Underlying calvarium appears stable and intact. No other orbit or scalp soft tissue injury identified. IMPRESSION: 1. Mild left posterior convexity scalp hematoma without underlying skull fracture. 2. No acute intracranial abnormality. Advanced cerebral white matter disease. Electronically Signed   By: Genevie Ann M.D.   On: 07/26/2021 09:05  ? ?CT Angio Chest PE W/Cm &/Or Wo Cm ? ?Result Date: 07/26/2021 ?CLINICAL DATA:  Shortness of breath EXAM: CT ANGIOGRAPHY CHEST WITH CONTRAST TECHNIQUE: Multidetector CT imaging of the chest was performed using the standard protocol during bolus administration of intravenous contrast. Multiplanar CT image reconstructions and MIPs were obtained to evaluate the vascular anatomy. RADIATION DOSE REDUCTION: This exam was performed according to the departmental dose-optimization program which includes automated exposure control, adjustment of the mA and/or kV according to patient size and/or use of iterative reconstruction technique. CONTRAST:  31m OMNIPAQUE IOHEXOL 350 MG/ML SOLN  COMPARISON:  Chest radiograph done earlier today FINDINGS: Cardiovascular: Right ventricular cavity measures larger in comparison to the left ventricular cavity. There is homogeneous enhancement in thoracic aorta. There is ectasia of ascending thoracic aorta measuring 3.6 cm. There are filling defects in the lobar segmental and subsegmental branches in the right upper lobe, right lower lobe and left lower lobe. There is moderate to large thrombus burden. Mediastinum/Nodes: No significant lymphadenopathy seen. Lungs/Pleura: Centrilobular emphysema is seen. There are faint ground-glass infiltrates in both upper lobes. There are patchy alveolar  infiltrates in the lingula and left lower lobe. There is minimal left pleural effusion. There is no pneumothorax. Upper Abdomen: There is air in the intrahepatic bile ducts, possibly suggesting previous sphincterotom

## 2021-07-26 NOTE — ED Triage Notes (Signed)
[facility] [place] [facility] a [place] out of the bed- pt does not take any blood thinners- pt does have a contusion to the bach of her head- pt c/o rib pain on the left side- pt has hx of dementia ?

## 2021-07-26 NOTE — Assessment & Plan Note (Signed)
Continue Ativan, mirtazapine and bupropion ?

## 2021-07-26 NOTE — ED Notes (Signed)
Pt taken for xray

## 2021-07-26 NOTE — Assessment & Plan Note (Signed)
Without behavioral disturbances ?Continue Ativan, bupropion and mirtazapine  ?

## 2021-07-26 NOTE — H&P (Signed)
?History and Physical  ? ? ?Patient: Kimberly Meyer RXV:[id number] DOB: [birth year] ?DOA: 07/26/2021 ?DOS: the patient was seen and examined on 07/26/2021 ?PCP: Marinda Elk, MD  ?Patient coming from: Home ? ?Chief Complaint:  ?Chief Complaint  ?Patient presents with  ? Fall  ? ? ?Most of the history is obtained from ER notes and patient's niece ?HPI: Kimberly Meyer is a 76 y.o. female with medical history significant for ampullary carcinoma status postsurgical resection, GERD, history of PE, depression, hypertension who [facility] and was brought into the ER for evaluation of an unwitnessed fall.  Patient noted to have a contusion to the back of her head and complains of left-sided lateral chest wall pain. ?Patient had a CT angiogram which showed acute pulmonary embolism with moderate to large thrombus burden. Right ventricle cavity is dilated suggesting right ventricular ?strain. There are faint hazy ground-glass infiltrates in both upper lobes. There are patchy alveolar infiltrates in the lingula and left lower lobe. Findings suggest possible multifocal pneumonia. Part of the alveolar infiltrates in the lingula and left lower lobe may suggest ?changes of pulmonary infarction. Minimal left pleural effusion is seen. ?I am unable to do review of systems on this patient due to her underlying dementia ?She will be admitted to the hospital for further evaluation ?Review of Systems: unable to review all systems due to the inability of the patient to answer questions. ?Past Medical History:  ?Diagnosis Date  ? 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 Anemia   ? Anxiety   ? Chronic tension headache   ? Depression   ? GERD (gastroesophageal reflux disease)   ? History of pulmonary embolus (PE)   ? Hyperlipemia   ? Hypertension   ? IBS (irritable bowel syndrome)   ? Kidney stone   ? Low back pain   ? Situational stress   ? ?Past Surgical History:  ?Procedure Laterality Date  ? APPENDECTOMY    ? AUGMENTATION  MAMMAPLASTY  1990's  ? BLADDER SURGERY    ? bladder tack  ? BREAST IMPLANT REMOVAL Left 07/15/2016  ? Procedure: REMOVAL OF EXPOSED LEFT BREAST IMPLANT;  Surgeon: Wallace Going, DO;  Location: [place];  Service: Plastics;  Laterality: Left;  ? CHOLECYSTECTOMY    ? COLONOSCOPY    ? COLONOSCOPY WITH PROPOFOL N/A 07/22/2015  ? Procedure: COLONOSCOPY WITH PROPOFOL;  Surgeon: Manya Silvas, MD;  Location: [facility] ENDOSCOPY;  Service: Endoscopy;  Laterality: N/A;  ? CYSTOSCOPY/URETEROSCOPY/HOLMIUM LASER/STENT PLACEMENT Left 11/13/2014  ? Procedure: CYSTOSCOPY/URETEROSCOPY/HOLMIUM LASER/STENT PLACEMENT;  Surgeon: Hollice Espy, MD;  Location: [facility] ORS;  Service: Urology;  Laterality: Left;  ? EXTRACORPOREAL SHOCK WAVE LITHOTRIPSY Left 06/09/2018  ? Procedure: EXTRACORPOREAL SHOCK WAVE LITHOTRIPSY (ESWL);  Surgeon: Abbie Sons, MD;  Location: [facility] ORS;  Service: Urology;  Laterality: Left;  ? HIP ARTHROPLASTY Left 03/18/2021  ? Procedure: ARTHROPLASTY BIPOLAR HIP (HEMIARTHROPLASTY);  Surgeon: Hessie Knows, MD;  Location: [facility] ORS;  Service: Orthopedics;  Laterality: Left;  ? HIP SURGERY    ? KYPHOPLASTY N/A 06/04/2015  ? Procedure: KYPHOPLASTY T11, T12;  Surgeon: Hessie Knows, MD;  Location: [facility] ORS;  Service: Orthopedics;  Laterality: N/A;  ? LITHOTRIPSY    ? PANCREATICODUODENECTOMY    ? secondary to ampullary carcinoma  ? PLACEMENT OF BREAST IMPLANTS    ? TUBAL LIGATION    ? VENTRAL HERNIA REPAIR    ? WHIPPLE PROCEDURE    ? ?Social History:  reports that she has quit  smoking. Her smoking use included cigarettes. She has never used smokeless tobacco. She reports that she does not drink alcohol and does not use drugs. ? ?Allergies  ?Allergen Reactions  ? Sulfa Antibiotics Other (See Comments)  ?  Does not remember reaction-from childhood  ? ? ?Family History  ?Problem Relation Age of Onset  ? Alzheimer's disease Father   ? Kidney Stones Father   ? Stroke Mother   ? Diabetes type II Mother   ?  Breast cancer Neg Hx   ? ? ?Prior to Admission medications   ?Medication Sig Start Date End Date Taking? Authorizing Provider  ?buPROPion (WELLBUTRIN SR) 150 MG 12 hr tablet Take 150 mg by mouth 2 (two) times daily. 09/27/14  Yes [provider]  ?ibuprofen (ADVIL) 200 MG tablet Take 600 mg by mouth every 8 (eight) hours as needed for mild pain or moderate pain.   Yes [provider]  ?lisinopril (PRINIVIL,ZESTRIL) 10 MG tablet Take 10 mg by mouth daily. 09/27/14  Yes [provider]  ?Melatonin 5 MG CAPS Take 5 mg by mouth at bedtime. 05/01/21  Yes [provider]  ?mirtazapine (REMERON) 7.5 MG tablet Take 7.5 mg by mouth at bedtime. 07/14/21  Yes [provider]  ?pantoprazole (PROTONIX) 40 MG tablet Take 40 mg by mouth daily.   Yes [provider]  ?Potassium Citrate 15 MEQ (1620 MG) TBCR Take 1 tablet by mouth 2 (two) times daily. 04/28/18  Yes Hollice Espy, MD  ?citalopram (CELEXA) 10 MG tablet Take 10 mg by mouth daily. ?Patient not taking: Reported on 07/26/2021    [provider]  ?clonazePAM (KLONOPIN) 0.5 MG tablet Take 0.5 mg by mouth at bedtime.    [provider]  ?docusate sodium (COLACE) 100 MG capsule Take 1 capsule (100 mg total) by mouth 2 (two) times daily. 03/19/21   Duanne Guess, PA-C  ?donepezil (ARICEPT) 10 MG tablet Take 10 mg by mouth at bedtime. ?Patient not taking: Reported on 07/26/2021    [provider]  ?enoxaparin (LOVENOX) 40 MG/0.4ML injection Inject 0.4 mLs (40 mg total) into the skin daily for 14 days. ?Patient not taking: Reported on 07/26/2021 03/19/21 04/02/21  Duanne Guess, PA-C  ?lactulose (CHRONULAC) 10 GM/15ML solution Take 30 mLs (20 g total) by mouth daily as needed for mild constipation. 03/21/21   Dwyane Dee, MD  ?LORazepam (ATIVAN) 0.5 MG tablet Take 0.5 mg by mouth 3 (three) times daily as needed. 06/30/21   [provider]  ?oxyCODONE (OXY IR/ROXICODONE) 5 MG immediate release  tablet Take 1-2 tablets (5-10 mg total) by mouth every 4 (four) hours as needed for moderate pain (pain score 4-6). 03/19/21   Duanne Guess, PA-C  ? ? ?Physical Exam: ?Vitals:  ? 07/26/21 0816 07/26/21 0818  ?BP:  (!) 121/101  ?Pulse:  95  ?Resp:  18  ?Temp:  98.1 ?F (36.7 ?C)  ?TempSrc:  Oral  ?SpO2:  95%  ?Weight: 40.8 kg   ?Height: '5\' 1"'$  (1.549 m)   ? ?Physical Exam ?Constitutional:   ?   Comments: Thin.  Appears comfortable and in no distress  ?HENT:  ?   Head: Normocephalic.  ?   Comments: Hematoma on the posterior part of her head ?   Nose: Nose normal.  ?   Mouth/Throat:  ?   Mouth: Mucous membranes are moist.  ?Eyes:  ?   Conjunctiva/sclera: Conjunctivae normal.  ?Cardiovascular:  ?   Rate and Rhythm: Normal rate and  regular rhythm.  ?Pulmonary:  ?   Effort: Pulmonary effort is normal.  ?   Breath sounds: Normal breath sounds.  ?Abdominal:  ?   General: Abdomen is flat. Bowel sounds are normal.  ?   Palpations: Abdomen is soft.  ?Musculoskeletal:     ?   General: Normal range of motion.  ?   Cervical back: Normal range of motion and neck supple.  ?Skin: ?   General: Skin is warm and dry.  ?Neurological:  ?   Mental Status: She is alert.  ?   Comments: Oriented only to person not to place or time  ?Psychiatric:     ?   Mood and Affect: Mood normal.     ?   Behavior: Behavior normal.  ? ? ?Data Reviewed: ?Relevant notes from primary care and specialist visits, past discharge summaries as available in EHR, including Care Everywhere. ?Prior diagnostic testing as pertinent to current admission diagnoses ?Updated medications and problem lists for reconciliation ?ED course, including vitals, labs, imaging, treatment and response to treatment ?Triage notes, nursing and pharmacy notes and ED provider's notes ?Notable results as noted in HPI ?Labs reviewed and essentially negative except for glucose 133, calcium 8.4, total protein 6.4, albumin 2.6 ?Chest x-ray reviewed by me shows no definite acute cardiopulmonary  abnormality or acute traumatic ?injury identified; breast implant artifact felt to explain left greater than right lung base opacity. Osteopenia with chronic spinal compression fractures and chronic right humeru

## 2021-07-26 NOTE — ED Notes (Signed)
Pt taken for CT 

## 2021-07-26 NOTE — Assessment & Plan Note (Addendum)
Stable

## 2021-07-26 NOTE — Assessment & Plan Note (Addendum)
Patient is status post a mechanical fall ?Place patient on fall precautions ?Patient is currently on a heparin drip and will likely require long term anticoagulation for her PE. ?She has a history of frequent falls which increases her risk for bleeding and this will need to be considered prior to discharge ?

## 2021-07-26 NOTE — Progress Notes (Signed)
[facility] [address] [facility]) Hospitalized Hospice Patient ? ?Kimberly Meyer is a current hospice patient with a terminal diagnosis of abnormal weight loss. She sustained a fall, was noted to be short of breath and was sent to the ED for further evaluation. She is admitted with a pulmonary embolism. This is a related hospital admission per Dr. Alferd Patee, [facility] medical director. ? ?Patient is alert and confused per her baseline. No family at the bedside. Spoke with son Kimberly Meyer over the phone to confirm his wishes regarding her plan of care. He would like to treat the pulmonary embolism and will consider other modalities if they are not overtly invasive and are in line with maintaining her comfort. ? ?Kimberly Meyer is inpatient appropriate for necessary IV anticoagulation. ? ?V/S: 98.3 oral, 130/82, HR 86, RR 18, SPO2 96% on RA ?I&O: non recorded ?Labs: alk phos 146, albumin 2.6, total protein 6.4 ?IV/PRNs: heparin 700 unit/hour IV continuous ?Diagnostics: CT angio chest - Acute pulmonary embolism with moderate to large thrombus burden. Right ventricle cavity is dilated suggesting right ventricular strain. ? ?Problem List: ?- pulmonary embolism - currently on IV heparin; waiting vascular consultation to see if thrombectomy is an option ?- mechanical fall with head laceration - CT head without any acute abnormalities; frequent falls at her facility so d/c on DOAC will need to be closely considered ? ?GOC: clear, comfort but medically optimize, DNR ?D/C planning: likely return to her facility with hospice support ?Family: updated via phone ?IDT: hospice team aware and updated ? ?Medication list and transfer summary on shadow chart. ? ?Thank you, ?Kimberly Meyer BSN, RN ?[facility] Liaison  ?

## 2021-07-26 NOTE — Consult Note (Signed)
ANTICOAGULATION CONSULT NOTE ? ?Pharmacy Consult for IV Heparin ?Indication: pulmonary embolus ? ?Patient Measurements: ?Height: '5\' 1"'$  (154.9 cm) ?Weight: 40.8 kg (90 lb) ?IBW/kg (Calculated) : 47.8 ?Heparin Dosing Weight: 40.8 kg ? ?Labs: ?Recent Labs  ?  07/26/21 ?0837  ?HGB 12.2  ?HCT 38.3  ?PLT 241  ?CREATININE 0.86  ?TROPONINIHS 11  ? ? ?Estimated Creatinine Clearance: 35.3 mL/min (by C-G formula based on SCr of 0.86 mg/dL). ? ? ?Medications:  ?No anticoagulation prior to admission per my chart review. Medication reconciliation is pending ? ?Assessment: ?Patient is a 76 y/o F with medical history including history of PE, NAFLD, dementia, diabetes, malignant neoplasm of ampulla of Vater who presented to the ED 4/1 c/o fall out of bed. Subsequently found to have acute PE with right heart strain. Pharmacy consulted to initiate and manage heparin infusion. ? ?Baseline aPTT and PT-INR are pending. CBC acceptable. ? ?Goal of Therapy:  ?Heparin level 0.3-0.7 units/ml ?Monitor platelets by anticoagulation protocol: Yes ?  ?Plan:  ?--Heparin 2500 unit IV bolus followed by continuous infusion at 700 units/hr ?--HL 8 hours after initiation of infusion ?--Daily CBC per protocol while on IV heparin ? ?Benita Gutter ?07/26/2021,10:44 AM ? ? ?

## 2021-07-26 NOTE — Assessment & Plan Note (Addendum)
-   Blood pressure is stable °-Continue lisinopril °

## 2021-07-26 NOTE — Consult Note (Signed)
ANTICOAGULATION CONSULT NOTE ? ?Pharmacy Consult for IV Heparin ?Indication: pulmonary embolus ? ?Patient Measurements: ?Height: '5\' 1"'$  (154.9 cm) ?Weight: 40.8 kg (90 lb) ?IBW/kg (Calculated) : 47.8 ?Heparin Dosing Weight: 40.8 kg ? ?Labs: ?Recent Labs  ?  07/26/21 ?3329 07/26/21 ?1030 07/26/21 ?1052 07/26/21 ?1857  ?HGB 12.2  --   --   --   ?HCT 38.3  --   --   --   ?PLT 241  --   --   --   ?APTT  --   --  27  --   ?LABPROT  --   --  14.1  --   ?INR  --   --  1.1  --   ?HEPARINUNFRC  --   --   --  0.25*  ?CREATININE 0.86  --   --   --   ?TROPONINIHS 11 10  --   --   ? ? ? ?Estimated Creatinine Clearance: 35.3 mL/min (by C-G formula based on SCr of 0.86 mg/dL). ? ? ?Medications:  ?No anticoagulation prior to admission per my chart review. Medication reconciliation is pending ? ?Assessment: ?Patient is a 76 y/o F with medical history including history of PE, NAFLD, dementia, diabetes, malignant neoplasm of ampulla of Vater who presented to the ED 4/1 c/o fall out of bed. Subsequently found to have acute PE with right heart strain. Pharmacy consulted to initiate and manage heparin infusion. ? ? ?Goal of Therapy:  ?Heparin level 0.3-0.7 units/ml ?Monitor platelets by anticoagulation protocol: Yes ?  ?Plan:  ?Heparin level is subtherapeutic. Will order heparin 600 unit bolus and increase infusion to 800 units/hr. Re-check HL 8 hours after rate change. Daily CBC per protocol while on IV heparin ? ?Lycan Davee O Yancy Hascall ?07/26/2021,7:34 PM ? ? ?

## 2021-07-26 NOTE — Assessment & Plan Note (Addendum)
prior history of PE  ?--presented for evaluation after an unwitnessed fall and had a CT angiogram done which showed acute pulmonary embolism with moderate to large thrombus burden.  Right ventricle cavity is dilated suggesting right ventricular ?strain. ?--started on heparin drip in the ER ?Plan: ?--pending Echo to further evaluate RV strain ?--Vascular surgery consult for evaluation for possible thrombectomy ?--cont heparin gtt ?

## 2021-07-26 NOTE — ED Notes (Addendum)
Patient transported to CT 

## 2021-07-27 ENCOUNTER — Inpatient Hospital Stay
Admit: 2021-07-27 | Discharge: 2021-07-27 | Disposition: A | Discharge status: Home / Self Care | Attending: Internal Medicine | Admitting: Internal Medicine

## 2021-07-27 DIAGNOSIS — I2692 Saddle embolus of pulmonary artery without acute cor pulmonale: Secondary | ICD-10-CM

## 2021-07-27 LAB — ECHOCARDIOGRAM COMPLETE
Area-P 1/2: 4.68 cm2
Height: 61 in
S' Lateral: 2.24 cm
Weight: 1440 oz

## 2021-07-27 LAB — CBC
HCT: 35.6 % — ABNORMAL LOW (ref 36.0–46.0)
Hemoglobin: 11.6 g/dL — ABNORMAL LOW (ref 12.0–15.0)
MCH: 28.1 pg (ref 26.0–34.0)
MCHC: 32.6 g/dL (ref 30.0–36.0)
MCV: 86.2 fL (ref 80.0–100.0)
Platelets: 253 10*3/uL (ref 150–400)
RBC: 4.13 MIL/uL (ref 3.87–5.11)
RDW: 14.6 % (ref 11.5–15.5)
WBC: 8.3 10*3/uL (ref 4.0–10.5)
nRBC: 0 % (ref 0.0–0.2)

## 2021-07-27 LAB — HEPARIN LEVEL (UNFRACTIONATED)
Heparin Unfractionated: 0.28 IU/mL — ABNORMAL LOW (ref 0.30–0.70)
Heparin Unfractionated: 0.4 IU/mL (ref 0.30–0.70)
Heparin Unfractionated: 0.43 IU/mL (ref 0.30–0.70)

## 2021-07-27 LAB — BASIC METABOLIC PANEL
Anion gap: 5 (ref 5–15)
BUN: 15 mg/dL (ref 8–23)
CO2: 30 mmol/L (ref 22–32)
Calcium: 8.2 mg/dL — ABNORMAL LOW (ref 8.9–10.3)
Chloride: 106 mmol/L (ref 98–111)
Creatinine, Ser: 0.81 mg/dL (ref 0.44–1.00)
GFR, Estimated: >60 mL/min (ref >60)
Glucose, Bld: 92 mg/dL (ref 70–99)
Potassium: 3.4 mmol/L — ABNORMAL LOW (ref 3.5–5.1)
Sodium: 141 mmol/L (ref 135–145)

## 2021-07-27 MED ORDER — HEPARIN BOLUS VIA INFUSION
600.0000 [IU] | Freq: Once | INTRAVENOUS | Status: AC
  Administered 2021-07-27: 600 [IU] via INTRAVENOUS
  Filled 2021-07-27: qty 600

## 2021-07-27 MED ORDER — POTASSIUM CHLORIDE 20 MEQ PO PACK
40.0000 meq | PACK | Freq: Once | ORAL | Status: AC
  Administered 2021-07-27: 40 meq via ORAL
  Filled 2021-07-27: qty 2

## 2021-07-27 NOTE — Plan of Care (Signed)

## 2021-07-27 NOTE — Progress Notes (Signed)
[facility] [address] [facility]) Hospitalized Hospice Patient ?  ?Kimberly Meyer is a current hospice patient with a terminal diagnosis of abnormal weight loss. She sustained a fall, was noted to be short of breath and was sent to the ED for further evaluation. She is admitted with a pulmonary embolism. This is a related hospital admission per Dr. Alferd Patee, [facility] medical director. ? ?Patient alert and pleasantly confused. She is not aware of where she is. Denies any pain or SOB.  ? ?Kimberly Meyer is inpatient appropriate for necessary IV anticoagulation. ? ?V/S: 121/75, HR 82, RR 18, SPO2 96% on RA ?I&O: 188/ no output recorded ?Labs: heparin unfractionated 0.28 IU/ml ?Diagnostics: none ?IV/PRN: heparin bolus 600 u IV x 1, heparin infusion increased to 900 u/hour ? ?Problem List: ?- pulmonary embolism - currently on IV heparin; still waiting for vascular surgery consultation to see if thrombectomy is an option ?- mechanical fall with head laceration - CT head without any acute abnormalities; frequent falls at her facility so d/c on DOAC will need to be closely considered ?  ?GOC: clear, comfort but medically optimize, DNR ?D/C planning: likely return to her facility with hospice support ?Family: updated via phone ?IDT: hospice team aware and updated ?  ?  ?Thank you, ?Venia Carbon BSN, RN ?[facility] Liaison  ?

## 2021-07-27 NOTE — Consult Note (Signed)
ANTICOAGULATION CONSULT NOTE ? ?Pharmacy Consult for IV Heparin ?Indication: pulmonary embolus ? ?Patient Measurements: ?Height: '5\' 1"'$  (154.9 cm) ?Weight: 40.8 kg (90 lb) ?IBW/kg (Calculated) : 47.8 ?Heparin Dosing Weight: 40.8 kg ? ?Labs: ?Recent Labs  ?  07/26/21 ?4166 07/26/21 ?1030 07/26/21 ?1052 07/26/21 ?1857 07/27/21 ?0408 07/27/21 ?1355 07/27/21 ?2201  ?HGB 12.2  --   --   --  11.6*  --   --   ?HCT 38.3  --   --   --  35.6*  --   --   ?PLT 241  --   --   --  253  --   --   ?APTT  --   --  27  --   --   --   --   ?LABPROT  --   --  14.1  --   --   --   --   ?INR  --   --  1.1  --   --   --   --   ?HEPARINUNFRC  --   --   --    < > 0.28* 0.40 0.43  ?CREATININE 0.86  --   --   --  0.81  --   --   ?TROPONINIHS 11 10  --   --   --   --   --   ? < > = values in this interval not displayed.  ? ? ? ?Estimated Creatinine Clearance: 37.5 mL/min (by C-G formula based on SCr of 0.81 mg/dL). ? ? ?Medications:  ?No anticoagulation prior to admission per my chart review. Medication reconciliation is pending ? ?Assessment: ?Patient is a 76 y/o F with medical history including history of PE, NAFLD, dementia, diabetes, malignant neoplasm of ampulla of Vater who presented to the ED 4/1 c/o fall out of bed. Subsequently found to have acute PE with right heart strain. Pharmacy consulted to initiate and manage heparin infusion. ? ?4/2 1355 HL 0.4  ?4/2 2201 HL 0.43, therapeutic X 2  ? ?Goal of Therapy:  ?Heparin level 0.3-0.7 units/ml ?Monitor platelets by anticoagulation protocol: Yes ?  ?Plan:  ?4/2:  HL @ 2201 = 0.43, therapeutic X 2 ?Will continue pt on current rate and recheck HL on 4/3 with AM labs. ? ?Priyah Schmuck D, PharmD ?07/27/2021,11:21 PM ? ? ?

## 2021-07-27 NOTE — Consult Note (Signed)
ANTICOAGULATION CONSULT NOTE ? ?Pharmacy Consult for IV Heparin ?Indication: pulmonary embolus ? ?Patient Measurements: ?Height: '5\' 1"'$  (154.9 cm) ?Weight: 40.8 kg (90 lb) ?IBW/kg (Calculated) : 47.8 ?Heparin Dosing Weight: 40.8 kg ? ?Labs: ?Recent Labs  ?  07/26/21 ?2409 07/26/21 ?1030 07/26/21 ?1052 07/26/21 ?1857 07/27/21 ?0408  ?HGB 12.2  --   --   --  11.6*  ?HCT 38.3  --   --   --  35.6*  ?PLT 241  --   --   --  253  ?APTT  --   --  27  --   --   ?LABPROT  --   --  14.1  --   --   ?INR  --   --  1.1  --   --   ?HEPARINUNFRC  --   --   --  0.25* 0.28*  ?CREATININE 0.86  --   --   --  0.81  ?TROPONINIHS 11 10  --   --   --   ? ? ? ?Estimated Creatinine Clearance: 37.5 mL/min (by C-G formula based on SCr of 0.81 mg/dL). ? ? ?Medications:  ?No anticoagulation prior to admission per my chart review. Medication reconciliation is pending ? ?Assessment: ?Patient is a 76 y/o F with medical history including history of PE, NAFLD, dementia, diabetes, malignant neoplasm of ampulla of Vater who presented to the ED 4/1 c/o fall out of bed. Subsequently found to have acute PE with right heart strain. Pharmacy consulted to initiate and manage heparin infusion. ? ? ?Goal of Therapy:  ?Heparin level 0.3-0.7 units/ml ?Monitor platelets by anticoagulation protocol: Yes ?  ?Plan:  ?4/2:  HL @ 0408 = 0.28, SUBtherapeutic ?Will order heparin 600 units IV X 1 bolus and increase drip rate to 900 units/hr.  ?Will recheck HL 8 hrs after rate change.  ? ?Layonna Dobie D ?07/27/2021,5:45 AM ? ? ?

## 2021-07-27 NOTE — Progress Notes (Signed)
*  PRELIMINARY RESULTS* ?Echocardiogram ?2D Echocardiogram has been performed. ? ?Kimberly Meyer ?07/27/2021, 2:57 PM ?

## 2021-07-27 NOTE — Consult Note (Signed)
ANTICOAGULATION CONSULT NOTE ? ?Pharmacy Consult for IV Heparin ?Indication: pulmonary embolus ? ?Patient Measurements: ?Height: '5\' 1"'$  (154.9 cm) ?Weight: 40.8 kg (90 lb) ?IBW/kg (Calculated) : 47.8 ?Heparin Dosing Weight: 40.8 kg ? ?Labs: ?Recent Labs  ?  07/26/21 ?7902 07/26/21 ?1030 07/26/21 ?1052 07/26/21 ?1857 07/27/21 ?0408 07/27/21 ?1355  ?HGB 12.2  --   --   --  11.6*  --   ?HCT 38.3  --   --   --  35.6*  --   ?PLT 241  --   --   --  253  --   ?APTT  --   --  27  --   --   --   ?LABPROT  --   --  14.1  --   --   --   ?INR  --   --  1.1  --   --   --   ?HEPARINUNFRC  --   --   --  0.25* 0.28* 0.40  ?CREATININE 0.86  --   --   --  0.81  --   ?TROPONINIHS 11 10  --   --   --   --   ? ? ? ?Estimated Creatinine Clearance: 37.5 mL/min (by C-G formula based on SCr of 0.81 mg/dL). ? ? ?Medications:  ?No anticoagulation prior to admission per my chart review. Medication reconciliation is pending ? ?Assessment: ?Patient is a 76 y/o F with medical history including history of PE, NAFLD, dementia, diabetes, malignant neoplasm of ampulla of Vater who presented to the ED 4/1 c/o fall out of bed. Subsequently found to have acute PE with right heart strain. Pharmacy consulted to initiate and manage heparin infusion. ? ?4/2 1355 HL 0.4  ? ?Goal of Therapy:  ?Heparin level 0.3-0.7 units/ml ?Monitor platelets by anticoagulation protocol: Yes ?  ?Plan:  ?Heparin level is therapeutic. Will continue heparin infusion at 900 units/hr. Recheck heparin level in 8 hours. CBC daily while on heparin.  ? ? ?Oswald Hillock, PharmD, BCPS ?07/27/2021,3:13 PM ? ? ?

## 2021-07-27 NOTE — Progress Notes (Signed)
?  Progress Note ? ? ?Patient: Kimberly Meyer TGG:[id number] DOB: 1945/05/14 DOA: 07/26/2021     1 ?DOS: the patient was seen and examined on 07/27/2021 ?  ?Brief hospital course: ?No notes on file ? ?Assessment and Plan: ?* Acute pulmonary embolism ([place]) ?prior history of PE  ?--presented for evaluation after an unwitnessed fall and had a CT angiogram done which showed acute pulmonary embolism with moderate to large thrombus burden.  Right ventricle cavity is dilated suggesting right ventricular ?strain. ?--started on heparin drip in the ER ?Plan: ?--pending Echo to further evaluate RV strain ?--Vascular surgery consult for evaluation for possible thrombectomy ?--cont heparin gtt ? ?Fall ?Patient is status post a mechanical fall ?Place patient on fall precautions ?Patient is currently on a heparin drip and will likely require long term anticoagulation for her PE. ?She has a history of frequent falls which increases her risk for bleeding and this will need to be considered prior to discharge ? ?Depression with anxiety ?Continue Ativan, mirtazapine and bupropion ? ?Dementia ([place]) ?Without behavioral disturbances ?Continue Ativan, bupropion and mirtazapine  ? ?HTN (hypertension) ?Blood pressure is stable ?Continue lisinopril ? ?NAFLD (nonalcoholic fatty liver disease) ?Stable ? ? ? ? ?  ? ?Subjective:  ?When asked about shortness of breath, pt confirmed yes.  When asked about chest pain and leg pain, pt said no.  Pt said she lives at home with her parents. ? ? ?Physical Exam: ? ?Constitutional: NAD, alert, not oriented, able to answer questions ?HEENT: conjunctivae and lids normal, EOMI ?CV: No cyanosis.   ?RESP: normal respiratory effort, on RA ?Extremities: No effusions, edema in BLE ?SKIN: warm, dry ?Neuro: II - XII grossly intact.   ? ? ?Data Reviewed: ? ?Family Communication:  ? ?Disposition: ?Status is: Inpatient ? ? Planned Discharge Destination:  her facility ? ? ? ?Time spent: 50 minutes ? ?Author: ?Enzo Bi,  MD ?07/27/2021 2:07 PM ? ?For on call review [email or web address].  ?

## 2021-07-28 LAB — BASIC METABOLIC PANEL
Anion gap: 9 (ref 5–15)
BUN: 14 mg/dL (ref 8–23)
CO2: 24 mmol/L (ref 22–32)
Calcium: 8.3 mg/dL — ABNORMAL LOW (ref 8.9–10.3)
Chloride: 108 mmol/L (ref 98–111)
Creatinine, Ser: 0.8 mg/dL (ref 0.44–1.00)
GFR, Estimated: >60 mL/min (ref >60)
Glucose, Bld: 85 mg/dL (ref 70–99)
Potassium: 3.4 mmol/L — ABNORMAL LOW (ref 3.5–5.1)
Sodium: 141 mmol/L (ref 135–145)

## 2021-07-28 LAB — CBC
HCT: 35.6 % — ABNORMAL LOW (ref 36.0–46.0)
Hemoglobin: 11.5 g/dL — ABNORMAL LOW (ref 12.0–15.0)
MCH: 28 pg (ref 26.0–34.0)
MCHC: 32.3 g/dL (ref 30.0–36.0)
MCV: 86.8 fL (ref 80.0–100.0)
Platelets: 280 10*3/uL (ref 150–400)
RBC: 4.1 MIL/uL (ref 3.87–5.11)
RDW: 14.6 % (ref 11.5–15.5)
WBC: 6.6 10*3/uL (ref 4.0–10.5)
nRBC: 0 % (ref 0.0–0.2)

## 2021-07-28 LAB — MAGNESIUM: Magnesium: 2 mg/dL (ref 1.7–2.4)

## 2021-07-28 LAB — HEPARIN LEVEL (UNFRACTIONATED): Heparin Unfractionated: 0.24 IU/mL — ABNORMAL LOW (ref 0.30–0.70)

## 2021-07-28 MED ORDER — APIXABAN 5 MG PO TABS
10.0000 mg | ORAL_TABLET | Freq: Two times a day (BID) | ORAL | Status: DC
  Administered 2021-07-28: 10 mg via ORAL
  Filled 2021-07-28: qty 2

## 2021-07-28 MED ORDER — APIXABAN 5 MG PO TABS: 10.0000 mg | ORAL_TABLET | Freq: Two times a day (BID) | ORAL | Status: DC

## 2021-07-28 MED ORDER — APIXABAN 5 MG PO TABS: 5.0000 mg | ORAL_TABLET | Freq: Two times a day (BID) | ORAL | Status: DC

## 2021-07-28 MED ORDER — LORAZEPAM 0.5 MG PO TABS: 0.5000 mg | ORAL_TABLET | Freq: Three times a day (TID) | ORAL | Status: DC | PRN

## 2021-07-28 MED ORDER — POTASSIUM CHLORIDE 20 MEQ PO PACK
40.0000 meq | PACK | Freq: Once | ORAL | Status: AC
  Administered 2021-07-28: 40 meq via ORAL
  Filled 2021-07-28: qty 2

## 2021-07-28 MED ORDER — LORAZEPAM 2 MG/ML IJ SOLN
0.5000 mg | Freq: Three times a day (TID) | INTRAMUSCULAR | Status: DC | PRN
  Administered 2021-07-28: 0.5 mg via INTRAVENOUS
  Filled 2021-07-28: qty 1

## 2021-07-28 MED ORDER — HEPARIN BOLUS VIA INFUSION
600.0000 [IU] | Freq: Once | INTRAVENOUS | Status: AC
  Administered 2021-07-28: 600 [IU] via INTRAVENOUS
  Filled 2021-07-28: qty 600

## 2021-07-28 NOTE — TOC Progression Note (Signed)
Transition of Care (TOC) - Progression Note  ? ? ?Patient Details  ?Name: ADASHA BOEHME ?MRN: [id number] ?Date of Birth: 20-Feb-1946 ? ?Transition of Care (TOC) CM/SW Contact  ?Trayvion Embleton A Kameryn Davern, LCSW ?Phone Number: ?07/28/2021, 10:12 AM ? ?Clinical Narrative:   CSW spoke with pt's son West Carbo [facility]) to confirm the plan. Pt's son states he hasnt heard from any Dr in days so he is not even sure if they were doing surgery. Pt's son would appreciate a call from MD-- MD notified. Pt's son did state that he is ok with pt returning to [place] with Hospice services. MD notified. Pt will need EMS transport at d/c. [facility] is aware of dc today and is just requesting the dc ppwk be sent with pt.  ? ? ? ?  ?  ? ?Expected Discharge Plan and Services ?  ?  ?  ?  ?  ?Expected Discharge Date: 07/28/21               ?  ?  ?  ?  ?  ?  ?  ?  ?  ?  ? ? ?Social Determinants of Health (SDOH) Interventions ?  ? ?Readmission Risk Interventions ?   ? View : No data to display.  ?  ?  ?  ? ? ?

## 2021-07-28 NOTE — TOC Transition Note (Signed)
Transition of Care (TOC) - CM/SW Discharge Note ? ? ?Patient Details  ?Name: Kimberly Meyer ?MRN: [id number] ?Date of Birth: 11/03/1945 ? ?Transition of Care (TOC) CM/SW Contact:  ?Melanye Hiraldo A Taja Pentland, LCSW ?Phone Number: ?07/28/2021, 12:54 PM ? ? ?Clinical Narrative:   Clinical Social Worker facilitated patient discharge including contacting patient family and facility to confirm patient discharge plans.  Clinical information faxed to facility and family agreeable with plan.  CSW arranged ambulance transport via [facility] to [place].  RN to call [phone] for report prior to discharge. ? ? ? ? ?Final next level of care: Assisted Living ?Barriers to Discharge: No Barriers Identified ? ? ?Patient Goals and CMS Choice ?  ?  ?  ? ?Discharge Placement ?  ?           ?[facility] ?Patient to be transferred to facility by: [facility] ?Name of family member notified: son ?Patient and family notified of of transfer: 07/28/21 ? ?Discharge Plan and Services ?  ?  ?           ?  ?  ?  ?  ?  ?  ?  ?  ?  ?  ? ?Social Determinants of Health (SDOH) Interventions ?  ? ? ?Readmission Risk Interventions ?   ? View : No data to display.  ?  ?  ?  ? ? ? ? ? ?

## 2021-07-28 NOTE — Progress Notes (Signed)
[facility] Liaison note: ?Ms Gathright is currently followed by [facility] hospice at [facility]. ? ?Visited at bedside, patient sleeping soundly. She received 2 doses of PRN lorazepam overnight, Breakfast tray at bedside. Spoke with staff RN, TOC and staff aide. ? ?Per attending physician plan is for [facility] to [facility] ?Writer spoke via telephone to patient's son West Carbo, he is aware of planned discharge. He also confirmed that he did not want oral anticoagulant at discharge d/t his mother's high fall risk/past falls. ?Hospice team has been updated and discharge summary has been sent. ? ?TOC has arranged nonemergent EMS for discharge. ? ?Flo Shanks BSN, RN, [facility] ?Hospital Liaison  ?Manufacturing engineer ?[phone] ? ?

## 2021-07-28 NOTE — Consult Note (Signed)
ANTICOAGULATION CONSULT NOTE ? ?Pharmacy Consult for IV Heparin ?Indication: pulmonary embolus ? ?Patient Measurements: ?Height: '5\' 1"'$  (154.9 cm) ?Weight: 40.8 kg (90 lb) ?IBW/kg (Calculated) : 47.8 ?Heparin Dosing Weight: 40.8 kg ? ?Labs: ?Recent Labs  ?  07/26/21 ?6160 07/26/21 ?1030 07/26/21 ?1052 07/26/21 ?1857 07/27/21 ?0408 07/27/21 ?1355 07/27/21 ?2201 07/28/21 ?7371  ?HGB 12.2  --   --   --  11.6*  --   --  11.5*  ?HCT 38.3  --   --   --  35.6*  --   --  35.6*  ?PLT 241  --   --   --  253  --   --  280  ?APTT  --   --  27  --   --   --   --   --   ?LABPROT  --   --  14.1  --   --   --   --   --   ?INR  --   --  1.1  --   --   --   --   --   ?HEPARINUNFRC  --   --   --    < > 0.28* 0.40 0.43 0.24*  ?CREATININE 0.86  --   --   --  0.81  --   --  0.80  ?TROPONINIHS 11 10  --   --   --   --   --   --   ? < > = values in this interval not displayed.  ? ? ? ?Estimated Creatinine Clearance: 37.9 mL/min (by C-G formula based on SCr of 0.8 mg/dL). ? ? ?Medications:  ?No anticoagulation prior to admission per my chart review. Medication reconciliation is pending ? ?Assessment: ?Patient is a 76 y/o F with medical history including history of PE, NAFLD, dementia, diabetes, malignant neoplasm of ampulla of Vater who presented to the ED 4/1 c/o fall out of bed. Subsequently found to have acute PE with right heart strain. Pharmacy consulted to initiate and manage heparin infusion. ? ?4/2 1355 HL 0.4  ?4/2 2201 HL 0.43, therapeutic X 2  ?4/3 0538 HL 0.24, subtherapeutic  ?Goal of Therapy:  ?Heparin level 0.3-0.7 units/ml ?Monitor platelets by anticoagulation protocol: Yes ?  ?Plan:  ?4/3:  HL @ 0538 = 0.24, subtherapeutic  ?Will order heparin 600 units IV X 1 bolus and increase drip rate to 1000 units/hr. ?Will recheck HL 8 hrs after rate change.  ? ?Shona Pardo D, PharmD ?07/28/2021,6:29 AM ? ? ?

## 2021-07-28 NOTE — NC FL2 (Signed)
?[place] MEDICAID FL2 LEVEL OF CARE SCREENING TOOL  ?  ? ?IDENTIFICATION  ?Patient Name: ?Kimberly Meyer Birthdate: 03/30/1946 Sex: female Admission Date (Current Location): ?07/26/2021  ?[place] and [place] Number: ? [place] ?  Facility and Address:  ?[facility], [address], [place], [place] [address] ?     Provider Number: ?[id number]  ?Attending Physician Name and Address:  ?Enzo Bi, MD ? Relative Name and Phone Number:  ?  ?   ?Current Level of Care: ?Hospital Recommended Level of Care: ?Assisted Living Facility Prior Approval Number: ?  ? ?Date Approved/Denied: ?  PASRR Number: ?  ? ?Discharge Plan: ?Other (Comment) (ALF) ?  ? ?Current Diagnoses: ?Patient Active Problem List  ? Diagnosis Date Noted  ? Acute pulmonary embolism ([place]) 07/26/2021  ? Closed left hip fracture ([place]) 03/17/2021  ? HTN (hypertension) 03/17/2021  ? Diabetes mellitus without complication ([place]) 75/64/3329  ? Dementia ([place]) 03/17/2021  ? Depression with anxiety 03/17/2021  ? Fracture of lumbar spine ([place]) 03/17/2021  ? Fracture of thoracic spine ([place]) 03/17/2021  ? Fall   ? NAFLD (nonalcoholic fatty liver disease) 05/24/2017  ? Age-related osteoporosis without current pathological fracture 03/03/2017  ? Breast asymmetry 11/19/2016  ? Carcinoma of ampulla of Vater ([place]) 06/07/2015  ? 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[place]) 06/07/2015  ? 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 Chronic tension-type headache, intractable 11/07/2014  ? Acid reflux 11/07/2014  ? HLD (hyperlipidemia) 11/07/2014  ? BP (high blood pressure) 11/07/2014  ? 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  ? Calculus of kidney 11/07/2014  ? PE (pulmonary embolism) 11/07/2014  ? Functional bowel disorder 11/07/2014  ? History of artificial joint 03/06/2014  ? Acute situational disturbance 02/21/2014  ? History of pulmonary embolism 02/27/2009  ? ? ?Orientation RESPIRATION BLADDER Height & Weight   ?  ?Self ? Normal Incontinent Weight: 90 lb (40.8  kg) ?Height:  '5\' 1"'$  (154.9 cm)  ?BEHAVIORAL SYMPTOMS/MOOD NEUROLOGICAL BOWEL NUTRITION STATUS  ?    Continent Diet (regular diet)  ?AMBULATORY STATUS COMMUNICATION OF NEEDS Skin   ?Independent Verbally Normal ?  ?  ?  ?    ?     ?     ? ? ?Personal Care Assistance Level of Assistance  ?Bathing, Feeding, Dressing Bathing Assistance: Independent ?Feeding assistance: Independent ?Dressing Assistance: Independent ?   ? ?Functional Limitations Info  ?Sight, Speech, Hearing Sight Info: Adequate ?Hearing Info: Adequate ?Speech Info: Adequate  ? ? ?SPECIAL CARE FACTORS FREQUENCY  ?    ?  ?  ?  ?  ?  ?  ?   ? ? ?Contractures Contractures Info: Not present  ? ? ?Additional Factors Info  ?Code Status, Allergies Code Status Info: DNR ?Allergies Info: Sulfa Antibiotics ?  ?  ?  ?   ? ?  ?apixaban 5 MG Tabs tablet ?Commonly known as: ELIQUIS ?Take 2 tablets (10 mg total) by mouth 2 (two) times daily for 7 days. ?   ?apixaban 5 MG Tabs tablet ?Commonly known as: ELIQUIS ?Take 1 tablet (5 mg total) by mouth 2 (two) times daily. ?Start taking on: August 04, 2021 ?   ?buPROPion 150 MG 12 hr tablet ?Commonly known as: WELLBUTRIN SR ?Take 150 mg by mouth 2 (two) times daily. ?   ?docusate sodium 100 MG capsule ?Commonly known as: COLACE ?Take 1 capsule (100 mg total) by mouth 2 (two) times daily. ?   ?ibuprofen 200 MG tablet ?Commonly known as: ADVIL ?Take 600 mg by  mouth every 8 (eight) hours as needed for mild pain or moderate pain. ?   ?lactulose 10 GM/15ML solution ?Commonly known as: [place] ?Take 30 mLs (20 g total) by mouth daily as needed for mild constipation. ?   ?lisinopril 10 MG tablet ?Commonly known as: ZESTRIL ?Take 10 mg by mouth daily. ?   ?LORazepam 0.5 MG tablet ?Commonly known as: ATIVAN ?Take 0.5 mg by mouth 3 (three) times daily as needed. ?   ?Melatonin 5 MG Caps ?Take 5 mg by mouth at bedtime. ?   ?mirtazapine 7.5 MG tablet ?Commonly known as: REMERON ?Take 7.5 mg by mouth at bedtime. ?   ?pantoprazole 40 MG  tablet ?Commonly known as: PROTONIX ?Take 40 mg by mouth daily. ?   ?Potassium Citrate 15 MEQ (1620 MG) Tbcr ?Take 1 tablet by mouth 2 (two) times daily. ?   ?  ?Discharge Medications: ?Please see discharge summary for a list of discharge medications. ? ?Relevant Imaging Results: ? ?Relevant Lab Results: ? ? ?Additional Information ?OVZ:[id number] ? ?Zareah Hunzeker A Vashawn Ekstein, LCSW ? ? ? ? ?

## 2021-07-28 NOTE — Discharge Summary (Addendum)
? ?Physician Discharge Summary ? ? ?EUDELIA Meyer  female DOB: 1946-01-03  ?OQH:[id number] ? ?PCP: Marinda Elk, MD ? ?Admit date: 07/26/2021 ?Discharge date: 07/28/2021 ? ?Admitted From: ALF ?Disposition:  ALF ?CODE STATUS: DNR  Pt is followed by outpatient hospice. ? ?Hospital Course:  ?For full details, please see H&P, progress notes, consult notes and ancillary notes.  ?Briefly,  ?[facility]ly Meyer is a 76 y.o. female with medical history significant for ampullary carcinoma status postsurgical resection, history of PE, depression, hypertension who presented from [facility] for evaluation of an unwitnessed fall.   ? ?Patient noted to have a contusion to the back of her head and complains of left-sided lateral chest wall pain.  Patient had a CT angiogram which showed acute pulmonary embolism with moderate to large thrombus burden. ? ?* Acute pulmonary embolism ([place]) ?Prior history of PE  ?--Pt was not on anticoagulation PTA.  Pt presented for evaluation after an unwitnessed fall and had a CT angiogram done which showed acute pulmonary embolism with moderate to large thrombus burden.  Right ventricle cavity is dilated suggesting right ventricular strain, however, subsequent Echo showed normal RV function and no dilation.  Vascular was therefore not consulted for thrombectomy. ?--Pt was never hypoxic.   ?--Pt was started on heparin drip in the ER and transitioned to Eliquis.  Son had initially wanted pt to be treated for PE, however, later decided he doesn't want pt to be on blood thinner since pt falls frequently.  Son is aware of the risks associated with untreated PE, but believes pt doesn't have much time left and wants to keep pt comfortable and avoid adding additional problems such causing bleeding. ? ?Fall ?She has a history of frequent falls which increases her risk for bleeding, therefore pt was not discharged on anticoagulation.  Due to pt's severe dementia, pt is not a good candidate for rehab  therapies.  ?  ?Depression with anxiety ?Continue home Ativan PRN, mirtazapine and bupropion ?  ?Severe baseline Dementia ([place]) ?Without behavioral disturbances.  Pt is not oriented and can not follow commands. ?Continue home Ativan, bupropion and mirtazapine  ?  ?HTN (hypertension) ?Blood pressure is stable ?Continue lisinopril ?  ?NAFLD (nonalcoholic fatty liver disease) ?Stable ? ? ?Discharge Diagnoses:  ?Principal Problem: ?  Acute pulmonary embolism ([place]) ?Active Problems: ?  NAFLD (nonalcoholic fatty liver disease) ?  HTN (hypertension) ?  Dementia ([facility]) ?  Depression with anxiety ?  Fall ? ? ?30 Day Unplanned Readmission Risk Score   ? ?Flowsheet Row ED to Hosp-Admission (Current) from 07/26/2021 in [facility]  ?30 Day Unplanned Readmission Risk Score (%) 21.27 Filed at 07/28/2021 0801  ? ?  ? ? This score is the patient's risk of an unplanned readmission within 30 days of being discharged (0 -100%). The score is based on dignosis, age, lab data, medications, orders, and past utilization.   ?Low:  0-14.9   Medium: 15-21.9   High: 22-29.9   Extreme: 30 and above ? ?  ? ?  ? ? ?Discharge Instructions: ? ?Allergies as of 07/28/2021   ? ?   Reactions  ? Sulfa Antibiotics Other (See Comments)  ? Does not remember reaction-from childhood  ? ?  ? ?  ?Medication List  ?  ? ?STOP taking these medications   ? ?citalopram 10 MG tablet ?Commonly known as: CELEXA ?  ?clonazePAM 0.5 MG tablet ?Commonly known as: KLONOPIN ?  ?donepezil 10 MG  tablet ?Commonly known as: ARICEPT ?  ?enoxaparin 40 MG/0.4ML injection ?Commonly known as: LOVENOX ?  ?oxyCODONE 5 MG immediate release tablet ?Commonly known as: Oxy IR/ROXICODONE ?  ? ?  ? ?TAKE these medications   ? ?buPROPion 150 MG 12 hr tablet ?Commonly known as: WELLBUTRIN SR ?Take 150 mg by mouth 2 (two) times daily. ?  ?docusate sodium 100 MG capsule ?Commonly known as: COLACE ?Take 1 capsule (100 mg total) by mouth 2 (two) times daily. ?   ?ibuprofen 200 MG tablet ?Commonly known as: ADVIL ?Take 600 mg by mouth every 8 (eight) hours as needed for mild pain or moderate pain. ?  ?lactulose 10 GM/15ML solution ?Commonly known as: [place] ?Take 30 mLs (20 g total) by mouth daily as needed for mild constipation. ?  ?lisinopril 10 MG tablet ?Commonly known as: ZESTRIL ?Take 10 mg by mouth daily. ?  ?LORazepam 0.5 MG tablet ?Commonly known as: ATIVAN ?Take 0.5 mg by mouth 3 (three) times daily as needed. ?  ?Melatonin 5 MG Caps ?Take 5 mg by mouth at bedtime. ?  ?mirtazapine 7.5 MG tablet ?Commonly known as: REMERON ?Take 7.5 mg by mouth at bedtime. ?  ?pantoprazole 40 MG tablet ?Commonly known as: PROTONIX ?Take 40 mg by mouth daily. ?  ?Potassium Citrate 15 MEQ (1620 MG) Tbcr ?Take 1 tablet by mouth 2 (two) times daily. ?  ? ?  ? ? ? Follow-up Information   ? ? Marinda Elk, MD Follow up.   ?Specialty: Physician Assistant ?Contact information: ?[address] ?[place] [address] ?[phone] ? ? ?  ?  ? ?  ?  ? ?  ? ? ?Allergies  ?Allergen Reactions  ? Sulfa Antibiotics Other (See Comments)  ?  Does not remember reaction-from childhood  ? ? ? ?The results of significant diagnostics from this hospitalization (including imaging, microbiology, ancillary and laboratory) are listed below for reference.  ? ?Consultations: ? ? ?Procedures/Studies: ?DG Chest 2 View ? ?Result Date: 07/26/2021 ?CLINICAL DATA:  76 year old female with chest pain after fall from bed. Left side rib pain. EXAM: CHEST - 2 VIEW COMPARISON:  Portable chest 04/02/2021. FINDINGS: Seated AP and lateral views of the chest. Osteopenia with multilevel lower thoracic and lumbar compression fractures, some previously augmented. Chronic proximal right humerus fracture. Artifact from breast implants felt to explain asymmetric lower lung opacity. No pneumothorax or pulmonary edema. No pleural effusion on the lateral view. No definite acute pulmonary opacity. Normal cardiac size and  mediastinal contours. Visualized tracheal air column is within normal limits. Previous abdominal hernia repair with mesh. Negative visible bowel gas. IMPRESSION: 1. No definite acute cardiopulmonary abnormality or acute traumatic injury identified; breast implant artifact felt to explain left greater than right lung base opacity. 2. Osteopenia with chronic spinal compression fractures and chronic right humerus fracture. Electronically Signed   By: Genevie Ann M.D.   On: 07/26/2021 09:02  ? ?CT Head Wo Contrast ? ?Result Date: 07/26/2021 ?CLINICAL DATA:  76 year old female with chest pain after fall from bed. Left side rib pain. EXAM: CT HEAD WITHOUT CONTRAST TECHNIQUE: Contiguous axial images were obtained from the base of the skull through the vertex without intravenous contrast. RADIATION DOSE REDUCTION: This exam was performed according to the departmental dose-optimization program which includes automated exposure control, adjustment of the mA and/or kV according to patient size and/or use of iterative reconstruction technique. COMPARISON:  Head CT 06/15/2021. Brain MRI 12/30/2018. FINDINGS: Brain: Stable cerebral volume. No midline shift, ventriculomegaly, mass  effect, evidence of mass lesion, intracranial hemorrhage or evidence of cortically based acute infarction. Advanced, confluent bilateral cerebral white matter hypodensity appears stable since February. Vascular: Calcified atherosclerosis at the skull base. No suspicious intracranial vascular hyperdensity. Skull: Cervical spine reported separately. No skull fracture identified. Sinuses/Orbits: Visualized paranasal sinuses and mastoids are stable and well aerated. Other: Mild left posterior convexity scalp hematoma on series 3, image 40. Underlying calvarium appears stable and intact. No other orbit or scalp soft tissue injury identified. IMPRESSION: 1. Mild left posterior convexity scalp hematoma without underlying skull fracture. 2. No acute intracranial  abnormality. Advanced cerebral white matter disease. Electronically Signed   By: Genevie Ann M.D.   On: 07/26/2021 09:05  ? ?CT Angio Chest PE W/Cm &/Or Wo Cm ? ?Result Date: 07/26/2021 ?CLINICAL DATA:  [place]

## 2021-08-25 DEATH — deceased

## 2022-12-31 IMAGING — CT CT CERVICAL SPINE W/O CM
2 series · 11 of 27 positions shown, 14 images · non-contrast
Comparison: 05/01/2021 and prior CTs

CLINICAL DATA: 75-year-old female with head and neck injury
following fall. Initial encounter.



[Series 3: c spine soft · axial · 0.36mm/px · z∈[-194,-80]mm · 6 of 75 slices shown, 8 images]
[im 12/75  soft-tissue]
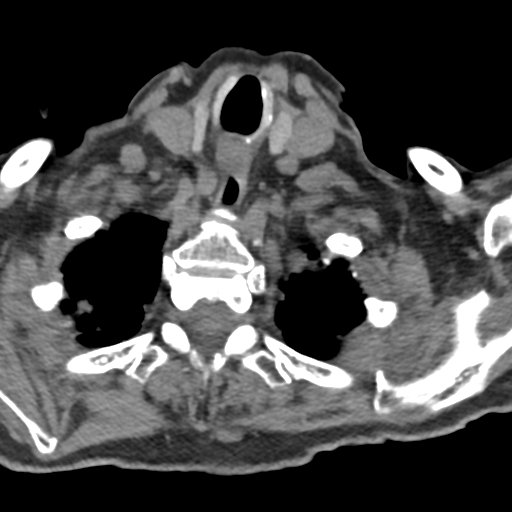
[im 12/75  bone]
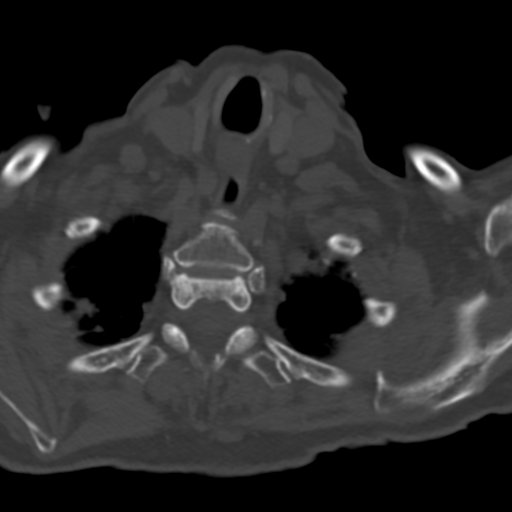
[im 23/75  bone]
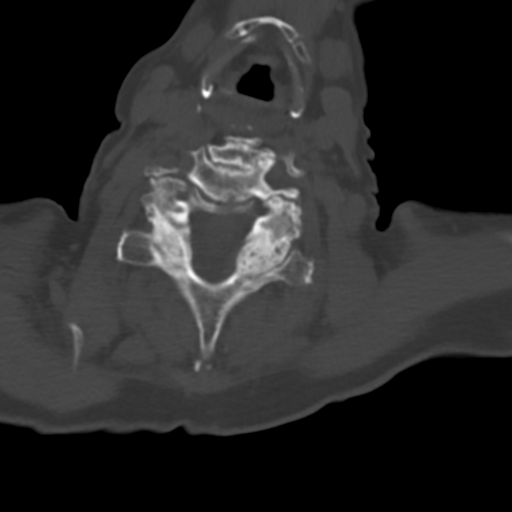
[im 35/75  bone]
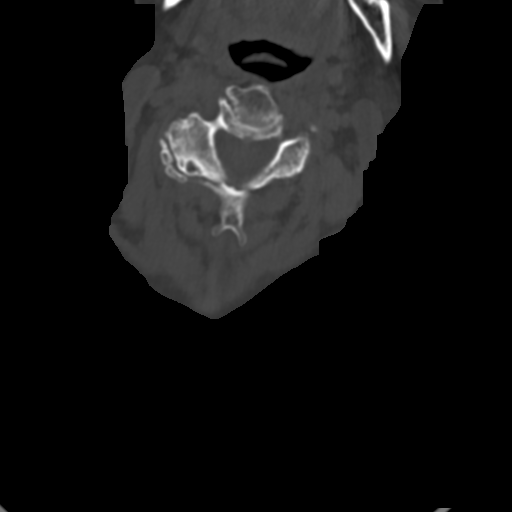
[im 46/75  bone]
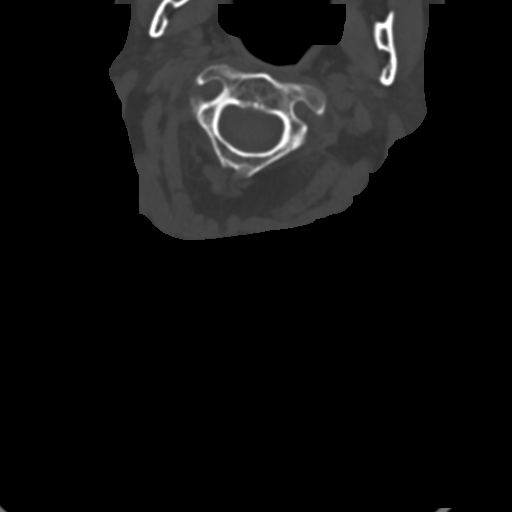
[im 57/75  soft-tissue]
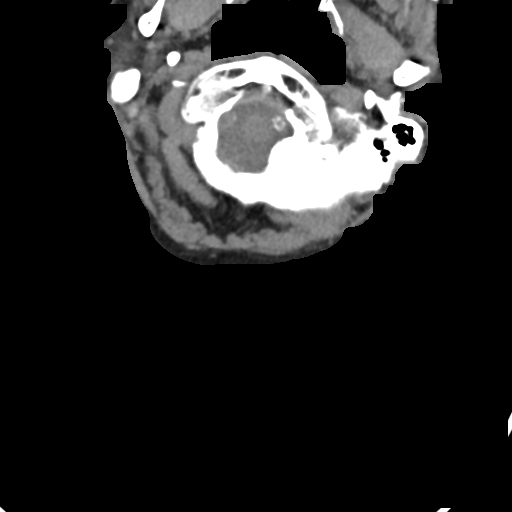
[im 57/75  bone]
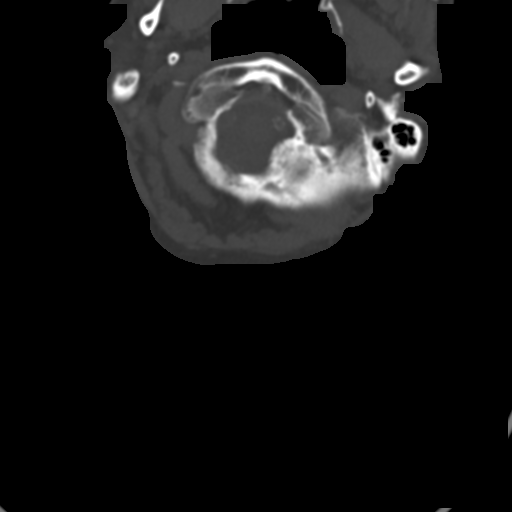
[im 69/75  bone]
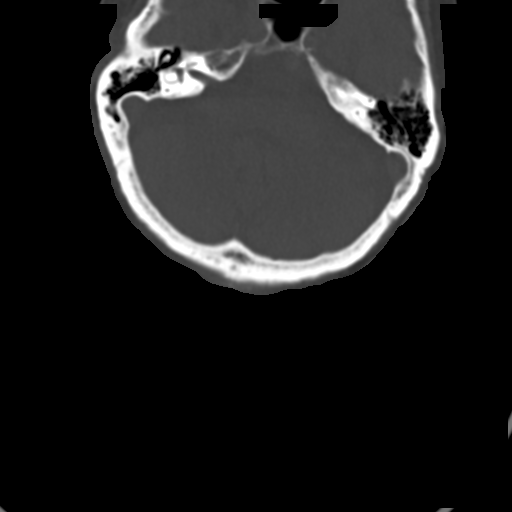

[Series 6: sagittal bone · sagittal · 0.23mm/px · 5 of 53 slices shown, 6 images]
[im 18/53  bone]
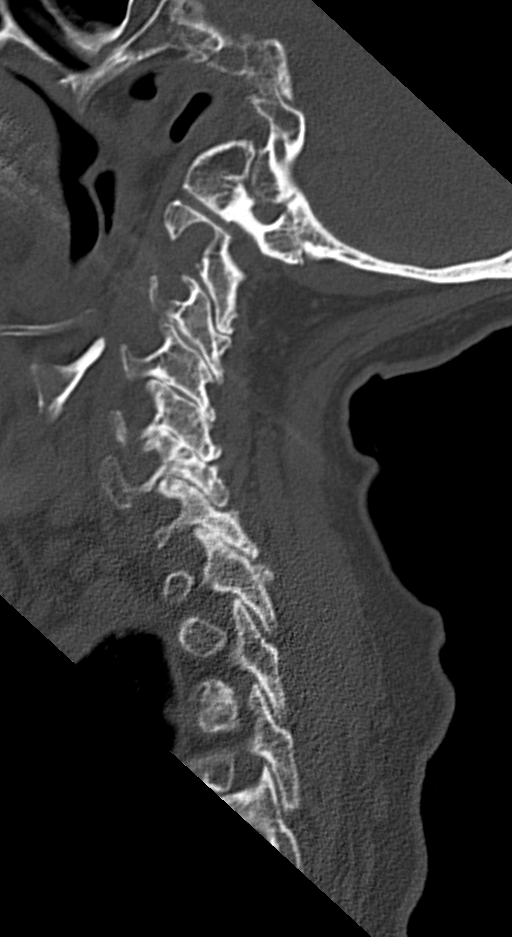
[im 22/53  bone]
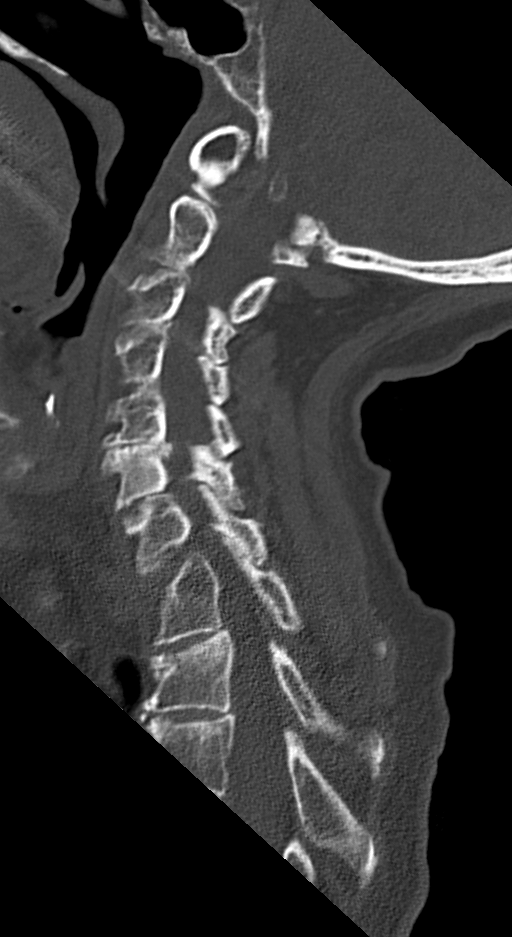
[im 27/53  soft-tissue]
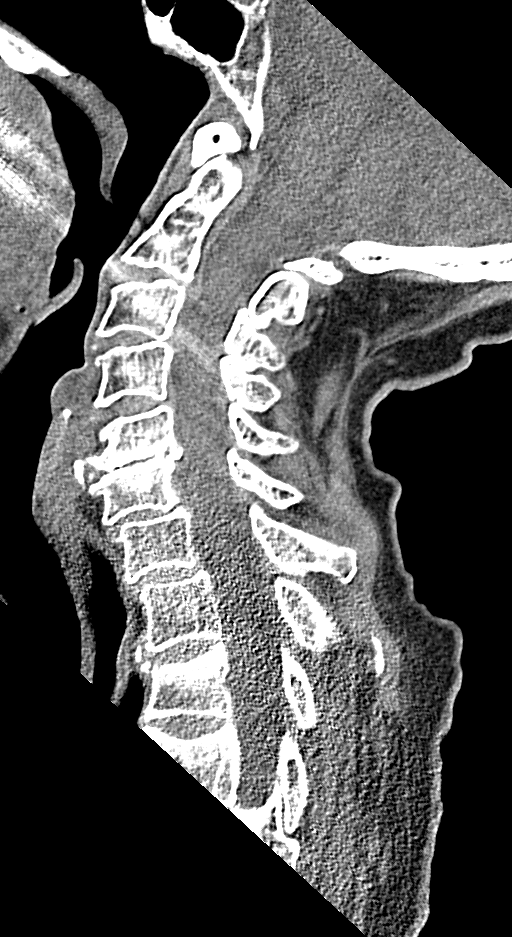
[im 27/53  bone]
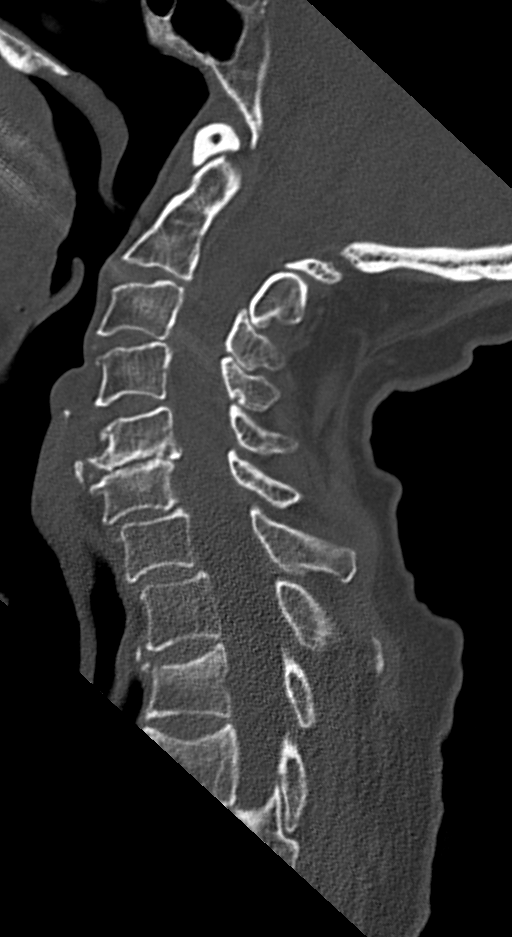
[im 31/53  bone]
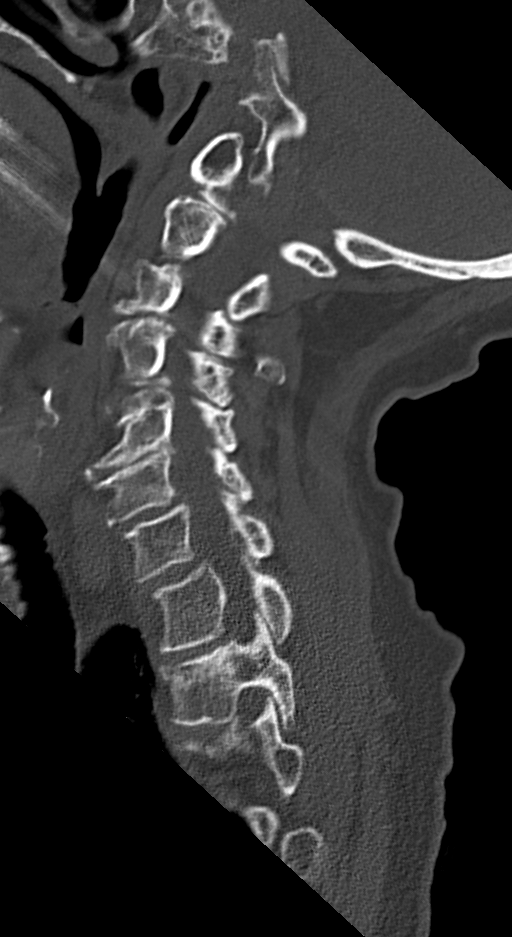
[im 35/53  bone]
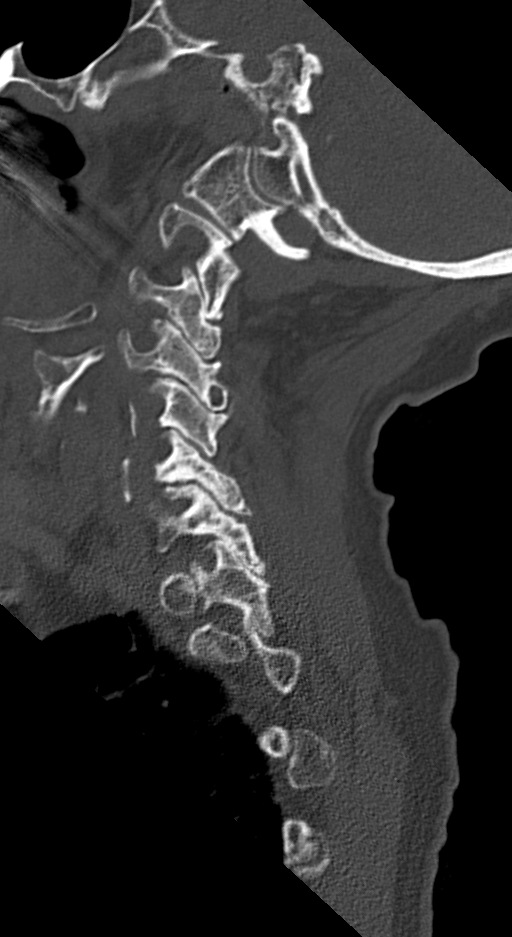

[11 of 27 positions shown; findings below may reference images not displayed]

FINDINGS: CT HEAD FINDINGS

Brain: No evidence of acute infarction, hemorrhage, hydrocephalus,
extra-axial collection or mass lesion/mass effect.

Atrophy and chronic small-vessel white matter ischemic changes are
again noted.

Vascular: Carotid and vertebral atherosclerotic calcifications are
noted.

Skull: Normal. Negative for fracture or focal lesion.

Sinuses/Orbits: No acute finding.

Other: None

CT CERVICAL SPINE FINDINGS

Alignment: No acute subluxation identified. Mild spondylolisthesis
at C5-6, C6-7 and C7-T1 again noted.

Skull base and vertebrae: No acute fracture is identified. SUPERIOR
endplate compression fractures of T2 and T3 are unchanged.

Soft tissues and spinal canal: No prevertebral fluid or swelling. No
visible canal hematoma.

Disc levels: Multilevel degenerative disc disease, spondylosis and
facet arthropathy are unchanged contributing to bony foraminal
narrowing at several levels.

Upper chest: No acute abnormality.

Other: None
IMPRESSION: 1. No evidence of acute intracranial abnormality. Atrophy and
chronic small-vessel white matter ischemic changes.
2. No static evidence of acute injury to the cervical spine.
Multilevel degenerative changes again noted.

## 2022-12-31 IMAGING — CT CT HEAD W/O CM
4 series · 16 of 47 positions shown, 18 images · non-contrast
Comparison: 05/01/2021 and prior CTs

CLINICAL DATA: 75-year-old female with head and neck injury
following fall. Initial encounter.



[Series 2: head bone · axial · 0.43mm/px · z∈[-73,-45]mm · 3 of 71 slices shown]
[im 8/71  bone]
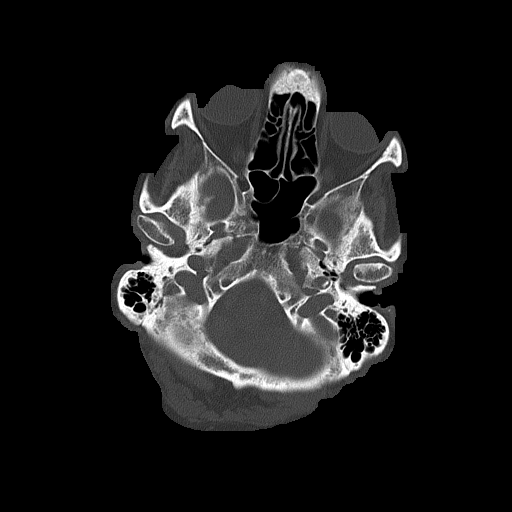
[im 15/71  bone]
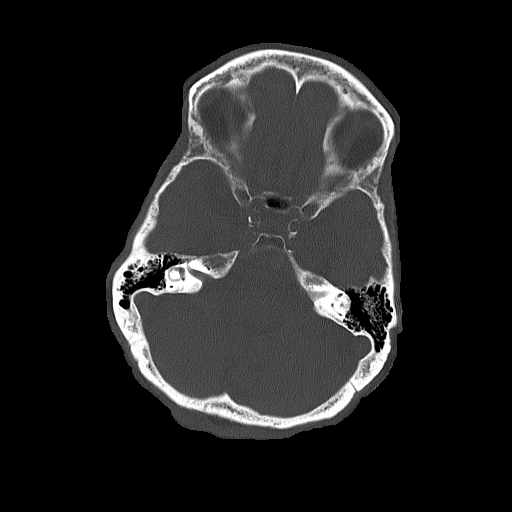
[im 22/71  bone]
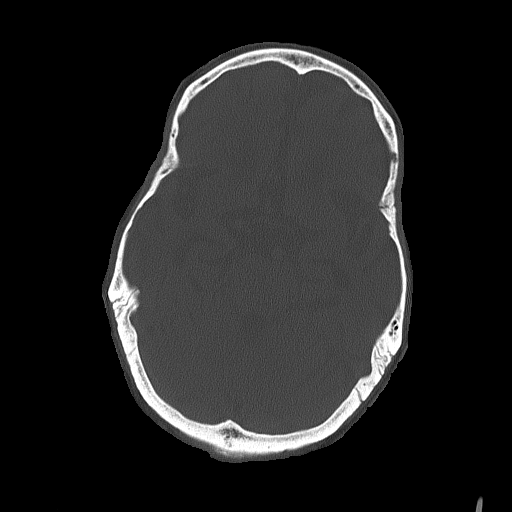

[Series 3: head wo · axial · 0.43mm/px · z∈[-72,+33]mm · 7 of 29 slices shown, 9 images]
[im 4/29  brain]
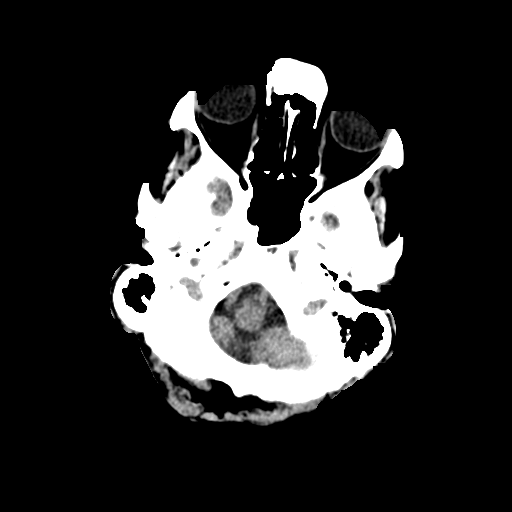
[im 4/29  bone]
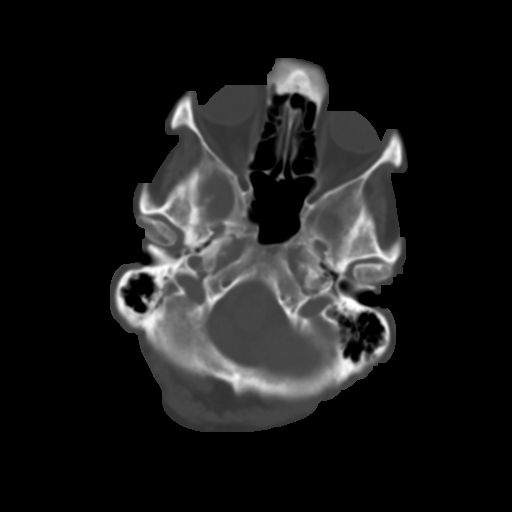
[im 8/29  brain]
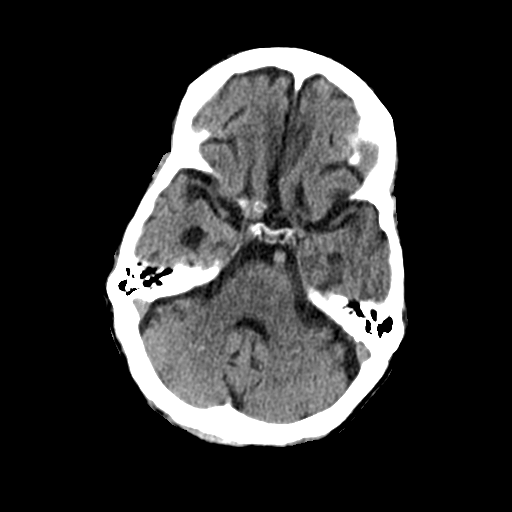
[im 11/29  brain]
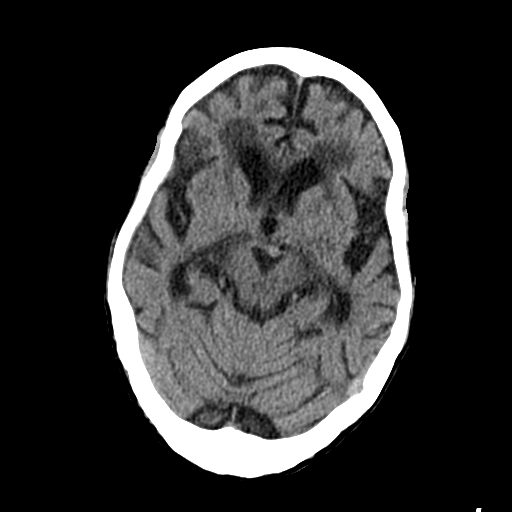
[im 15/29  brain]
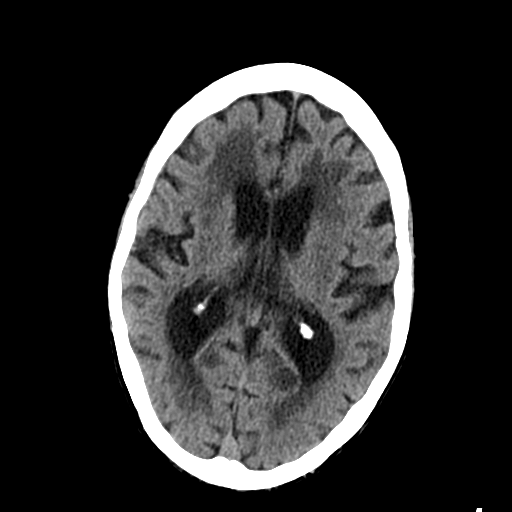
[im 18/29  brain]
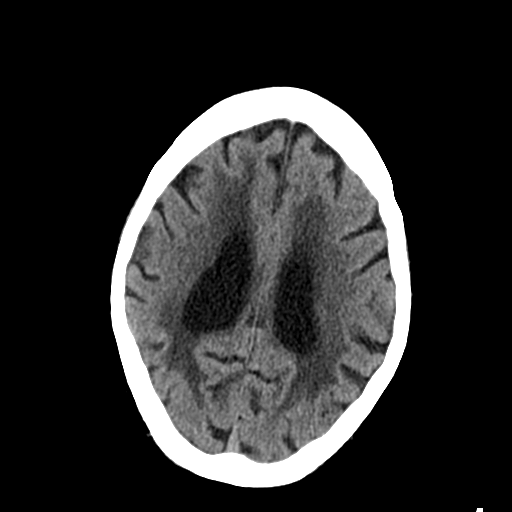
[im 18/29  bone]
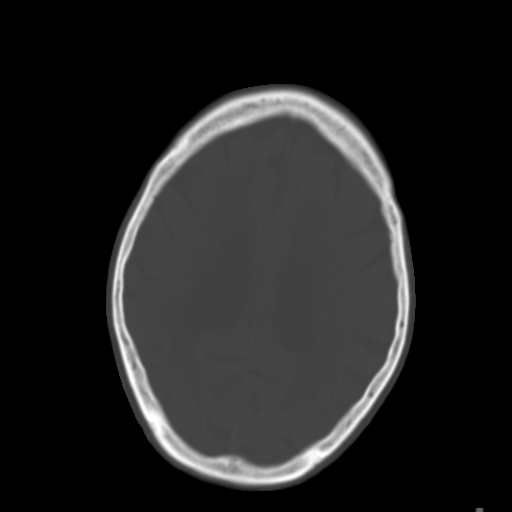
[im 22/29  brain]
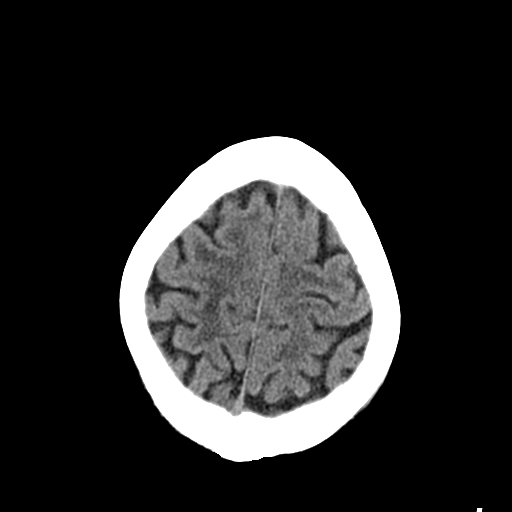
[im 25/29  brain]
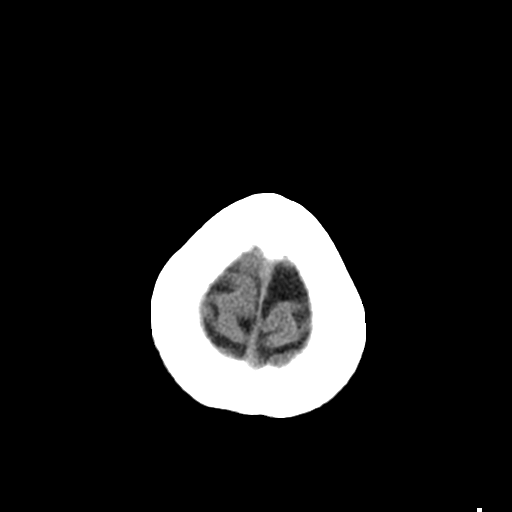

[Series 4: coronal soft tissue · coronal · 0.33mm/px · 3 of 68 slices shown]
[im 23/68  brain]
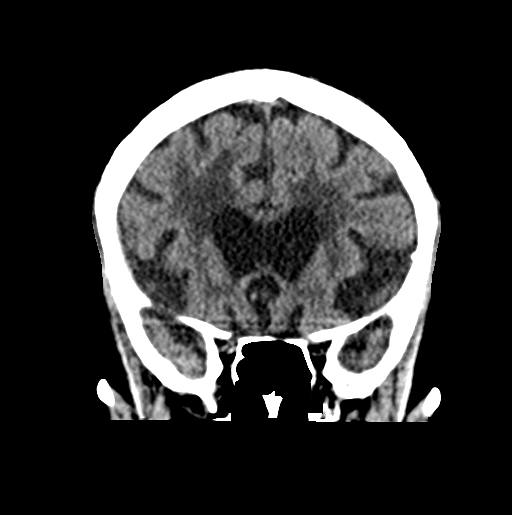
[im 30/68  brain]
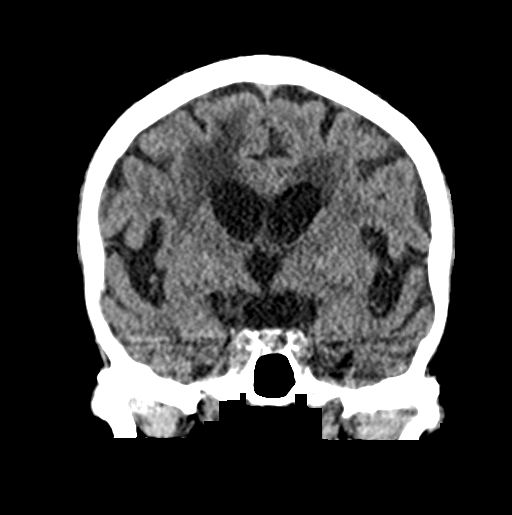
[im 38/68  brain]
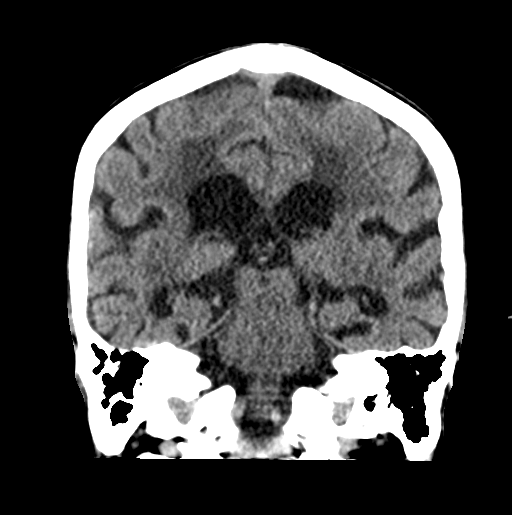

[Series 5: sagittal soft tissue · sagittal · 0.33mm/px · 3 of 49 slices shown]
[im 17/49  brain]
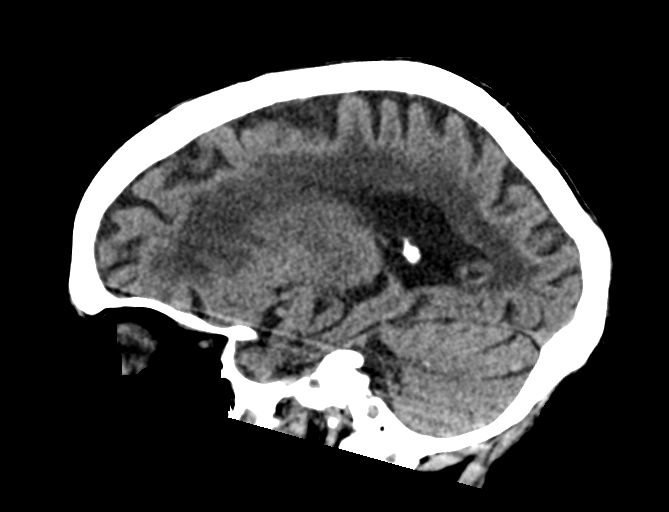
[im 25/49  brain]
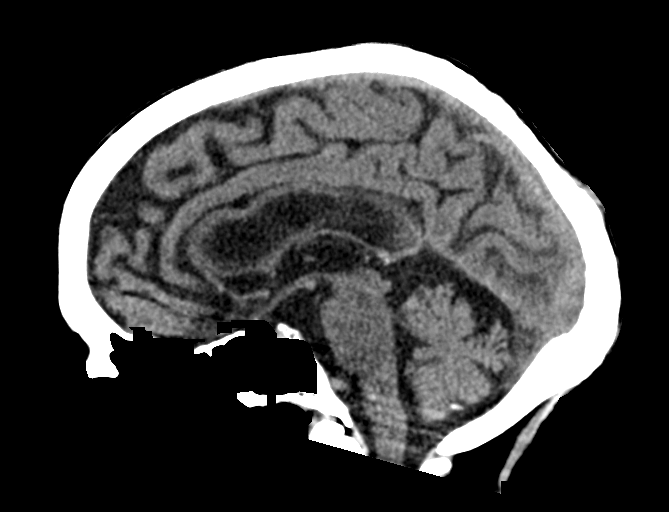
[im 33/49  brain]
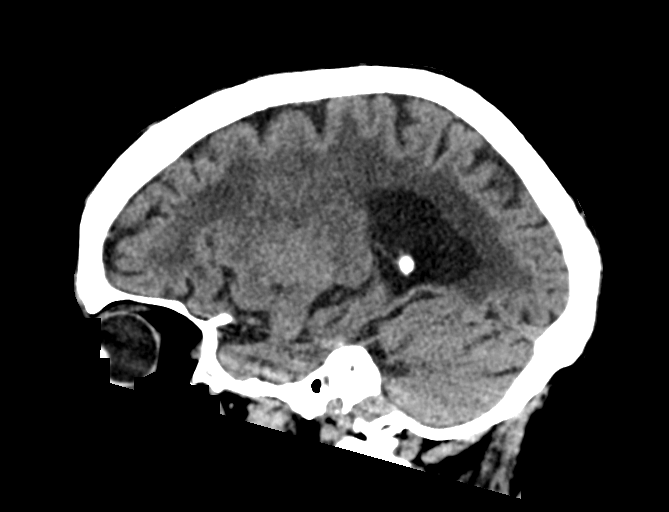

[16 of 47 positions shown; findings below may reference images not displayed]

FINDINGS: CT HEAD FINDINGS

Brain: No evidence of acute infarction, hemorrhage, hydrocephalus,
extra-axial collection or mass lesion/mass effect.

Atrophy and chronic small-vessel white matter ischemic changes are
again noted.

Vascular: Carotid and vertebral atherosclerotic calcifications are
noted.

Skull: Normal. Negative for fracture or focal lesion.

Sinuses/Orbits: No acute finding.

Other: None

CT CERVICAL SPINE FINDINGS

Alignment: No acute subluxation identified. Mild spondylolisthesis
at C5-6, C6-7 and C7-T1 again noted.

Skull base and vertebrae: No acute fracture is identified. SUPERIOR
endplate compression fractures of T2 and T3 are unchanged.

Soft tissues and spinal canal: No prevertebral fluid or swelling. No
visible canal hematoma.

Disc levels: Multilevel degenerative disc disease, spondylosis and
facet arthropathy are unchanged contributing to bony foraminal
narrowing at several levels.

Upper chest: No acute abnormality.

Other: None
IMPRESSION: 1. No evidence of acute intracranial abnormality. Atrophy and
chronic small-vessel white matter ischemic changes.
2. No static evidence of acute injury to the cervical spine.
Multilevel degenerative changes again noted.
